# Patient Record
Sex: Female | Born: 1935 | Race: White | Hispanic: No | Smoking: Former smoker
Health system: Southern US, Community
[De-identification: ages and names within clinical notes are randomized; demographics above are authoritative.]

## PROBLEM LIST (undated history)

## (undated) DIAGNOSIS — I10 Essential (primary) hypertension: Secondary | ICD-10-CM

## (undated) DIAGNOSIS — H353290 Exudative age-related macular degeneration, unspecified eye, stage unspecified: Secondary | ICD-10-CM

## (undated) DIAGNOSIS — G43909 Migraine, unspecified, not intractable, without status migrainosus: Secondary | ICD-10-CM

## (undated) DIAGNOSIS — J45909 Unspecified asthma, uncomplicated: Secondary | ICD-10-CM

## (undated) DIAGNOSIS — K5790 Diverticulosis of intestine, part unspecified, without perforation or abscess without bleeding: Secondary | ICD-10-CM

## (undated) DIAGNOSIS — E1142 Type 2 diabetes mellitus with diabetic polyneuropathy: Secondary | ICD-10-CM

## (undated) DIAGNOSIS — R600 Localized edema: Secondary | ICD-10-CM

## (undated) DIAGNOSIS — G510 Bell's palsy: Secondary | ICD-10-CM

## (undated) DIAGNOSIS — E785 Hyperlipidemia, unspecified: Secondary | ICD-10-CM

## (undated) DIAGNOSIS — J3089 Other allergic rhinitis: Secondary | ICD-10-CM

## (undated) DIAGNOSIS — K589 Irritable bowel syndrome without diarrhea: Secondary | ICD-10-CM

## (undated) DIAGNOSIS — M810 Age-related osteoporosis without current pathological fracture: Secondary | ICD-10-CM

## (undated) DIAGNOSIS — G459 Transient cerebral ischemic attack, unspecified: Secondary | ICD-10-CM

## (undated) HISTORY — DX: Hyperlipidemia, unspecified: E78.5

## (undated) HISTORY — PX: COLONOSCOPY: SHX174

## (undated) HISTORY — DX: Exudative age-related macular degeneration, unspecified eye, stage unspecified: H35.3290

## (undated) HISTORY — PX: OOPHORECTOMY: SHX86

## (undated) HISTORY — PX: TONSILLECTOMY: SUR1361

## (undated) HISTORY — DX: Type 2 diabetes mellitus with diabetic polyneuropathy: E11.42

## (undated) HISTORY — DX: Essential (primary) hypertension: I10

## (undated) HISTORY — PX: CHOLECYSTECTOMY: SHX55

---

## 1997-08-17 ENCOUNTER — Ambulatory Visit (HOSPITAL_COMMUNITY): Admission: RE | Admit: 1997-08-17 | Discharge: 1997-08-17 | Payer: Self-pay | Admitting: Family Medicine

## 1997-09-30 ENCOUNTER — Ambulatory Visit (HOSPITAL_COMMUNITY): Admission: RE | Admit: 1997-09-30 | Discharge: 1997-09-30 | Payer: Self-pay | Admitting: Obstetrics & Gynecology

## 1997-11-02 ENCOUNTER — Ambulatory Visit (HOSPITAL_COMMUNITY): Admission: RE | Admit: 1997-11-02 | Discharge: 1997-11-02 | Payer: Self-pay | Admitting: Family Medicine

## 1998-10-27 ENCOUNTER — Ambulatory Visit (HOSPITAL_COMMUNITY): Admission: RE | Admit: 1998-10-27 | Discharge: 1998-10-27 | Payer: Self-pay | Admitting: *Deleted

## 1998-11-07 ENCOUNTER — Ambulatory Visit (HOSPITAL_COMMUNITY): Admission: RE | Admit: 1998-11-07 | Discharge: 1998-11-07 | Payer: Self-pay | Admitting: Family Medicine

## 1999-12-14 ENCOUNTER — Ambulatory Visit (HOSPITAL_COMMUNITY): Admission: RE | Admit: 1999-12-14 | Discharge: 1999-12-14 | Payer: Self-pay | Admitting: *Deleted

## 1999-12-14 ENCOUNTER — Ambulatory Visit (HOSPITAL_COMMUNITY): Admission: RE | Admit: 1999-12-14 | Discharge: 1999-12-14 | Payer: Self-pay | Admitting: Obstetrics & Gynecology

## 2001-04-03 ENCOUNTER — Ambulatory Visit (HOSPITAL_COMMUNITY): Admission: RE | Admit: 2001-04-03 | Discharge: 2001-04-03 | Payer: Self-pay | Admitting: Obstetrics & Gynecology

## 2001-04-03 ENCOUNTER — Ambulatory Visit (HOSPITAL_COMMUNITY): Admission: RE | Admit: 2001-04-03 | Discharge: 2001-04-03 | Payer: Self-pay | Admitting: *Deleted

## 2002-10-08 ENCOUNTER — Ambulatory Visit (HOSPITAL_COMMUNITY): Admission: RE | Admit: 2002-10-08 | Discharge: 2002-10-08 | Payer: Self-pay | Admitting: *Deleted

## 2002-10-08 ENCOUNTER — Ambulatory Visit (HOSPITAL_COMMUNITY): Admission: RE | Admit: 2002-10-08 | Discharge: 2002-10-08 | Payer: Self-pay | Admitting: Obstetrics & Gynecology

## 2004-11-06 ENCOUNTER — Ambulatory Visit: Payer: Self-pay | Admitting: Family Medicine

## 2004-12-11 ENCOUNTER — Ambulatory Visit: Payer: Self-pay | Admitting: Family Medicine

## 2004-12-12 ENCOUNTER — Ambulatory Visit: Payer: Self-pay | Admitting: Family Medicine

## 2005-01-02 ENCOUNTER — Ambulatory Visit: Payer: Self-pay | Admitting: Family Medicine

## 2005-02-01 ENCOUNTER — Ambulatory Visit: Payer: Self-pay | Admitting: Family Medicine

## 2005-03-04 ENCOUNTER — Ambulatory Visit: Payer: Self-pay | Admitting: Family Medicine

## 2005-03-19 ENCOUNTER — Ambulatory Visit: Payer: Self-pay | Admitting: Family Medicine

## 2005-08-05 ENCOUNTER — Ambulatory Visit: Payer: Self-pay | Admitting: Oncology

## 2005-09-01 ENCOUNTER — Ambulatory Visit: Payer: Self-pay | Admitting: Oncology

## 2005-12-11 ENCOUNTER — Ambulatory Visit: Payer: Self-pay | Admitting: Oncology

## 2006-01-02 ENCOUNTER — Ambulatory Visit: Payer: Self-pay | Admitting: Oncology

## 2006-01-21 ENCOUNTER — Ambulatory Visit: Payer: Self-pay | Admitting: Family Medicine

## 2006-06-03 ENCOUNTER — Ambulatory Visit: Payer: Self-pay | Admitting: Oncology

## 2007-02-05 ENCOUNTER — Ambulatory Visit: Payer: Self-pay | Admitting: Family Medicine

## 2008-07-06 ENCOUNTER — Ambulatory Visit: Payer: Self-pay | Admitting: Family Medicine

## 2013-11-26 DIAGNOSIS — M810 Age-related osteoporosis without current pathological fracture: Secondary | ICD-10-CM | POA: Insufficient documentation

## 2014-11-01 DIAGNOSIS — H8112 Benign paroxysmal vertigo, left ear: Secondary | ICD-10-CM | POA: Insufficient documentation

## 2015-06-13 ENCOUNTER — Ambulatory Visit (INDEPENDENT_AMBULATORY_CARE_PROVIDER_SITE_OTHER): Payer: Medicare Other | Admitting: Podiatry

## 2015-06-13 ENCOUNTER — Encounter: Payer: Self-pay | Admitting: Podiatry

## 2015-06-13 VITALS — BP 148/78 | HR 91 | Resp 18

## 2015-06-13 DIAGNOSIS — M79676 Pain in unspecified toe(s): Secondary | ICD-10-CM | POA: Diagnosis not present

## 2015-06-13 DIAGNOSIS — B351 Tinea unguium: Secondary | ICD-10-CM | POA: Diagnosis not present

## 2015-06-13 NOTE — Progress Notes (Signed)
   Subjective:    Patient ID: Carrie Pratt, female    DOB: 25-Sep-1924, 82 y.o.   MRN: [id number]   HPI  82 year old female presents the office today for concerns of thick, painful, elongated toenails that she cannot trim herself. Denies any drainage or redness or swelling around the toenails. No recent injury or trauma. No swelling to her feet. No other complaints. She states that her blood sugar has been "good".    Review of Systems  All other systems reviewed and are negative.      Objective:   Physical Exam General: AAO x3, NAD  Dermatological: Nails appear to be hypertrophic, dystrophic, brittle, discolored, elongated 10. There is no surrounding erythema or drainage. There is tenderness to nails 1-5 bilaterally. There is no surrounding erythema or drainage. No open lesions or pre-ulcerative lesions identified at this time. The right fifth digit toenail is incurvated without any tenderness on the actual ingrown toenail portion. No redness or drainage.  There is no pain with calf compression, swelling, warmth, erythema.   Neruologic: Grossly intact via light touch bilateral. Vibratory intact via tuning fork bilateral. Protective threshold with Semmes Wienstein monofilament intact to all pedal sites bilateral. Patellar and Achilles deep tendon reflexes 2+ bilateral. No Babinski or clonus noted bilateral.   Musculoskeletal: No gross boney pedal deformities bilateral. No pain, crepitus, or limitation noted with foot and ankle range of motion bilateral. Muscular strength 5/5 in all groups tested bilateral.  Gait: Unassisted, Nonantalgic.      Assessment & Plan:  82 year old female with symptomatic onychomycosis  -Treatment options discussed including all alternatives, risks, and complications -Etiology of symptoms were discussed -Nails debrided 10 without complications or bleeding.  -Discussed importance of daily foot inspection.  -Follow-up in 3 months or sooner if any problems  arise. In the meantime, encouraged to call the office with any questions, concerns, change in symptoms.   Celesta Gentile, DPM

## 2015-09-12 ENCOUNTER — Encounter: Payer: Self-pay | Admitting: Podiatry

## 2015-09-12 ENCOUNTER — Ambulatory Visit (INDEPENDENT_AMBULATORY_CARE_PROVIDER_SITE_OTHER): Payer: Medicare Other | Admitting: Podiatry

## 2015-09-12 DIAGNOSIS — B351 Tinea unguium: Secondary | ICD-10-CM

## 2015-09-12 DIAGNOSIS — M79676 Pain in unspecified toe(s): Secondary | ICD-10-CM | POA: Diagnosis not present

## 2015-09-12 NOTE — Progress Notes (Signed)
Patient ID: Carrie Pratt, female   DOB: Oct 21, 1924, 82 y.o.   MRN: [id number] Subjective: 82 y.o. returns the office today for painful, elongated, thickened toenails which she cannot trim herself. Denies any redness or drainage around the nails. Denies any acute changes since last appointment and no new complaints today. Denies any systemic complaints such as fevers, chills, nausea, vomiting.   Objective: AAO 3, NAD DP/PT pulses palpable, CRT less than 3 seconds Nails hypertrophic, dystrophic, elongated, brittle, discolored 10. There is tenderness overlying the nails 1-5 bilaterally. There is no surrounding erythema or drainage along the nail sites. No open lesions or pre-ulcerative lesions are identified. No other areas of tenderness bilateral lower extremities. No overlying edema, erythema, increased warmth. No pain with calf compression, swelling, warmth, erythema.  Assessment: Patient presents with symptomatic onychomycosis  Plan: -Treatment options including alternatives, risks, complications were discussed -Nails sharply debrided 10 without complication/bleeding. -Discussed daily foot inspection. If there are any changes, to call the office immediately.  -Follow-up in 3 months or sooner if any problems are to arise. In the meantime, encouraged to call the office with any questions, concerns, changes symptoms.  Celesta Gentile, DPM

## 2015-12-14 ENCOUNTER — Ambulatory Visit (INDEPENDENT_AMBULATORY_CARE_PROVIDER_SITE_OTHER): Payer: Medicare Other | Admitting: Podiatry

## 2015-12-14 ENCOUNTER — Encounter: Payer: Self-pay | Admitting: Podiatry

## 2015-12-14 DIAGNOSIS — M79676 Pain in unspecified toe(s): Secondary | ICD-10-CM

## 2015-12-14 DIAGNOSIS — B351 Tinea unguium: Secondary | ICD-10-CM | POA: Diagnosis not present

## 2015-12-15 NOTE — Progress Notes (Signed)
Patient ID: Carrie Pratt, female   DOB: 04-22-1924, 82 y.o.   MRN: [id number] Subjective: 82 y.o. returns the office today for painful, elongated, thickened toenails which she cannot trim herself. Denies any redness or drainage around the nails. Denies any acute changes since last appointment and no new complaints today. Denies any systemic complaints such as fevers, chills, nausea, vomiting.   Objective: AAO 3, NAD DP/PT pulses palpable, CRT less than 3 seconds Nails hypertrophic, dystrophic, elongated, brittle, discolored 10. There is tenderness overlying the nails 1-5 bilaterally. There is no surrounding erythema or drainage along the nail sites. No open lesions or pre-ulcerative lesions are identified. No other areas of tenderness bilateral lower extremities. No overlying edema, erythema, increased warmth. No pain with calf compression, swelling, warmth, erythema.  Assessment: Patient presents with symptomatic onychomycosis  Plan: -Treatment options including alternatives, risks, complications were discussed -Nails sharply debrided 10 without complication/bleeding. -Discussed daily foot inspection. If there are any changes, to call the office immediately.  -Follow-up in 3 months or sooner if any problems are to arise. In the meantime, encouraged to call the office with any questions, concerns, changes symptoms.  Celesta Gentile, DPM

## 2016-03-19 ENCOUNTER — Ambulatory Visit (INDEPENDENT_AMBULATORY_CARE_PROVIDER_SITE_OTHER): Payer: Medicare Other | Admitting: Podiatry

## 2016-03-19 DIAGNOSIS — L608 Other nail disorders: Secondary | ICD-10-CM | POA: Diagnosis not present

## 2016-03-19 DIAGNOSIS — L84 Corns and callosities: Secondary | ICD-10-CM | POA: Diagnosis not present

## 2016-03-19 DIAGNOSIS — E0843 Diabetes mellitus due to underlying condition with diabetic autonomic (poly)neuropathy: Secondary | ICD-10-CM | POA: Diagnosis not present

## 2016-03-19 DIAGNOSIS — B351 Tinea unguium: Secondary | ICD-10-CM

## 2016-03-19 DIAGNOSIS — M79672 Pain in left foot: Secondary | ICD-10-CM | POA: Diagnosis not present

## 2016-03-19 DIAGNOSIS — L603 Nail dystrophy: Secondary | ICD-10-CM

## 2016-03-19 DIAGNOSIS — M79671 Pain in right foot: Secondary | ICD-10-CM

## 2016-03-19 DIAGNOSIS — L851 Acquired keratosis [keratoderma] palmaris et plantaris: Secondary | ICD-10-CM | POA: Diagnosis not present

## 2016-03-19 DIAGNOSIS — M79609 Pain in unspecified limb: Secondary | ICD-10-CM | POA: Diagnosis not present

## 2016-03-24 NOTE — Progress Notes (Signed)
   SUBJECTIVE Patient with a history of diabetes mellitus presents to office today complaining of elongated, thickened nails. Pain while ambulating in shoes. Patient is unable to trim their own nails.   OBJECTIVE General Patient is awake, alert, and oriented x 3 and in no acute distress. Derm hyperkeratotic skin lesion noted to the fourth digit left foot Skin is dry and supple bilateral. Negative open lesions or macerations. Remaining integument unremarkable. Nails are tender, long, thickened and dystrophic with subungual debris, consistent with onychomycosis, 1-5 bilateral. No signs of infection noted. Vasc  DP and PT pedal pulses palpable bilaterally. Temperature gradient within normal limits.  Neuro Epicritic and protective threshold sensation diminished bilaterally.  Musculoskeletal Exam No symptomatic pedal deformities noted bilateral. Muscular strength within normal limits.  ASSESSMENT 1. Diabetes Mellitus w/ peripheral neuropathy 2. Onychomycosis of nail due to dermatophyte bilateral 3. Pain in foot bilateral 4. Painful callus lesion fourth digit left foot  PLAN OF CARE 1. Patient evaluated today. 2. Instructed to maintain good pedal hygiene and foot care. Stressed importance of controlling blood sugar.  3. Mechanical debridement of nails 1-5 bilaterally performed using a nail nipper. Filed with dremel without incident.  4. Excisional debridement of the callus lesion the fourth digit left foot was performed using a chisel blade without incident. 5. Return to clinic in 3 mos.     Edrick Kins, DPM [facility]  Dr. Edrick Kins, [address]                                        [place], [place] [address]                Office [phone]  Fax [phone]

## 2016-04-02 DIAGNOSIS — E119 Type 2 diabetes mellitus without complications: Secondary | ICD-10-CM | POA: Diagnosis not present

## 2016-04-23 DIAGNOSIS — E113213 Type 2 diabetes mellitus with mild nonproliferative diabetic retinopathy with macular edema, bilateral: Secondary | ICD-10-CM | POA: Diagnosis not present

## 2016-04-23 DIAGNOSIS — H2513 Age-related nuclear cataract, bilateral: Secondary | ICD-10-CM | POA: Diagnosis not present

## 2016-04-30 DIAGNOSIS — H353211 Exudative age-related macular degeneration, right eye, with active choroidal neovascularization: Secondary | ICD-10-CM | POA: Diagnosis not present

## 2016-05-02 DIAGNOSIS — E1165 Type 2 diabetes mellitus with hyperglycemia: Secondary | ICD-10-CM | POA: Diagnosis not present

## 2016-05-09 DIAGNOSIS — E1122 Type 2 diabetes mellitus with diabetic chronic kidney disease: Secondary | ICD-10-CM | POA: Diagnosis not present

## 2016-05-09 DIAGNOSIS — N182 Chronic kidney disease, stage 2 (mild): Secondary | ICD-10-CM | POA: Diagnosis not present

## 2016-05-21 DIAGNOSIS — H2513 Age-related nuclear cataract, bilateral: Secondary | ICD-10-CM | POA: Diagnosis not present

## 2016-05-22 ENCOUNTER — Encounter: Payer: Self-pay | Admitting: *Deleted

## 2016-05-22 NOTE — Pre-Procedure Instructions (Signed)
Called Dr Dingeldein's  office called regarding order for ophthalmic solution.

## 2016-05-24 DIAGNOSIS — E119 Type 2 diabetes mellitus without complications: Secondary | ICD-10-CM | POA: Diagnosis not present

## 2016-05-24 DIAGNOSIS — I10 Essential (primary) hypertension: Secondary | ICD-10-CM | POA: Diagnosis not present

## 2016-05-24 DIAGNOSIS — Z8673 Personal history of transient ischemic attack (TIA), and cerebral infarction without residual deficits: Secondary | ICD-10-CM | POA: Diagnosis not present

## 2016-05-24 DIAGNOSIS — E1165 Type 2 diabetes mellitus with hyperglycemia: Secondary | ICD-10-CM | POA: Diagnosis not present

## 2016-05-24 DIAGNOSIS — E785 Hyperlipidemia, unspecified: Secondary | ICD-10-CM | POA: Diagnosis not present

## 2016-05-28 NOTE — H&P (Signed)
See scanned note.

## 2016-05-29 ENCOUNTER — Encounter
Admission: EM | Disposition: A | Discharge status: Home / Self Care | Payer: Self-pay | Source: Ambulatory Visit | Attending: Ophthalmology

## 2016-05-29 ENCOUNTER — Encounter: Payer: Self-pay | Admitting: *Deleted

## 2016-05-29 ENCOUNTER — Ambulatory Visit
Admission: EM | Admit: 2016-05-29 | Discharge: 2016-05-29 | Disposition: A | Discharge status: Home / Self Care | Payer: Medicare Other | Source: Ambulatory Visit | Attending: Ophthalmology | Admitting: Ophthalmology

## 2016-05-29 ENCOUNTER — Ambulatory Visit: Payer: Medicare Other | Admitting: Anesthesiology

## 2016-05-29 DIAGNOSIS — I10 Essential (primary) hypertension: Secondary | ICD-10-CM | POA: Insufficient documentation

## 2016-05-29 DIAGNOSIS — E1136 Type 2 diabetes mellitus with diabetic cataract: Secondary | ICD-10-CM | POA: Insufficient documentation

## 2016-05-29 DIAGNOSIS — G459 Transient cerebral ischemic attack, unspecified: Secondary | ICD-10-CM | POA: Diagnosis not present

## 2016-05-29 DIAGNOSIS — Z79899 Other long term (current) drug therapy: Secondary | ICD-10-CM | POA: Diagnosis not present

## 2016-05-29 DIAGNOSIS — Z7984 Long term (current) use of oral hypoglycemic drugs: Secondary | ICD-10-CM | POA: Diagnosis not present

## 2016-05-29 DIAGNOSIS — J45909 Unspecified asthma, uncomplicated: Secondary | ICD-10-CM | POA: Insufficient documentation

## 2016-05-29 DIAGNOSIS — H2513 Age-related nuclear cataract, bilateral: Secondary | ICD-10-CM | POA: Diagnosis not present

## 2016-05-29 DIAGNOSIS — Z87891 Personal history of nicotine dependence: Secondary | ICD-10-CM | POA: Diagnosis not present

## 2016-05-29 DIAGNOSIS — H269 Unspecified cataract: Secondary | ICD-10-CM | POA: Diagnosis not present

## 2016-05-29 DIAGNOSIS — E119 Type 2 diabetes mellitus without complications: Secondary | ICD-10-CM | POA: Diagnosis not present

## 2016-05-29 HISTORY — DX: Age-related osteoporosis without current pathological fracture: M81.0

## 2016-05-29 HISTORY — DX: Bell's palsy: G51.0

## 2016-05-29 HISTORY — DX: Transient cerebral ischemic attack, unspecified: G45.9

## 2016-05-29 HISTORY — DX: Diverticulosis of intestine, part unspecified, without perforation or abscess without bleeding: K57.90

## 2016-05-29 HISTORY — DX: Other allergic rhinitis: J30.89

## 2016-05-29 HISTORY — DX: Migraine, unspecified, not intractable, without status migrainosus: G43.909

## 2016-05-29 HISTORY — DX: Localized edema: R60.0

## 2016-05-29 HISTORY — DX: Unspecified asthma, uncomplicated: J45.909

## 2016-05-29 HISTORY — PX: CATARACT EXTRACTION W/PHACO: SHX586

## 2016-05-29 HISTORY — DX: Irritable bowel syndrome, unspecified: K58.9

## 2016-05-29 LAB — GLUCOSE, CAPILLARY: GLUCOSE-CAPILLARY: 137 mg/dL — AB (ref 65–99)

## 2016-05-29 SURGERY — PHACOEMULSIFICATION, CATARACT, WITH IOL INSERTION
Anesthesia: Monitor Anesthesia Care | Site: Eye | Laterality: Left

## 2016-05-29 MED ORDER — LIDOCAINE HCL (PF) 4 % IJ SOLN
INTRAMUSCULAR | Status: DC | PRN
  Administered 2016-05-29: 4 mL via OPHTHALMIC

## 2016-05-29 MED ORDER — MOXIFLOXACIN HCL 0.5 % OP SOLN
OPHTHALMIC | Status: DC | PRN
  Administered 2016-05-29: 0.2 mL via OPHTHALMIC

## 2016-05-29 MED ORDER — TETRACAINE HCL 0.5 % OP SOLN
OPHTHALMIC | Status: AC
  Filled 2016-05-29: qty 2

## 2016-05-29 MED ORDER — PHENYLEPHRINE HCL 10 % OP SOLN
OPHTHALMIC | Status: AC
  Administered 2016-05-29: 1 [drp] via OPHTHALMIC
  Filled 2016-05-29: qty 5

## 2016-05-29 MED ORDER — CARBACHOL 0.01 % IO SOLN
INTRAOCULAR | Status: DC | PRN
  Administered 2016-05-29: 0.5 mL via INTRAOCULAR

## 2016-05-29 MED ORDER — FENTANYL CITRATE (PF) 100 MCG/2ML IJ SOLN
INTRAMUSCULAR | Status: AC
  Filled 2016-05-29: qty 2

## 2016-05-29 MED ORDER — BUPIVACAINE HCL (PF) 0.75 % IJ SOLN
INTRAMUSCULAR | Status: AC
  Filled 2016-05-29: qty 10

## 2016-05-29 MED ORDER — CYCLOPENTOLATE HCL 2 % OP SOLN: 1.0000 [drp] | OPHTHALMIC | Status: AC

## 2016-05-29 MED ORDER — MOXIFLOXACIN HCL 0.5 % OP SOLN: 1.0000 [drp] | OPHTHALMIC | Status: AC

## 2016-05-29 MED ORDER — MIDAZOLAM HCL 2 MG/2ML IJ SOLN
INTRAMUSCULAR | Status: AC
  Filled 2016-05-29: qty 2

## 2016-05-29 MED ORDER — HYALURONIDASE HUMAN 150 UNIT/ML IJ SOLN
INTRAMUSCULAR | Status: AC
  Filled 2016-05-29: qty 1

## 2016-05-29 MED ORDER — MIDAZOLAM HCL 2 MG/2ML IJ SOLN
INTRAMUSCULAR | Status: DC | PRN
  Administered 2016-05-29: 0.5 mg via INTRAVENOUS

## 2016-05-29 MED ORDER — EPINEPHRINE PF 1 MG/ML IJ SOLN
INTRAMUSCULAR | Status: AC
  Filled 2016-05-29: qty 1

## 2016-05-29 MED ORDER — POVIDONE-IODINE 5 % OP SOLN
OPHTHALMIC | Status: AC
  Filled 2016-05-29: qty 30

## 2016-05-29 MED ORDER — MOXIFLOXACIN HCL 0.5 % OP SOLN
1.0000 [drp] | OPHTHALMIC | Status: DC
  Administered 2016-05-29 (×3): 1 [drp] via OPHTHALMIC

## 2016-05-29 MED ORDER — EPINEPHRINE PF 1 MG/ML IJ SOLN
INTRAMUSCULAR | Status: DC | PRN
  Administered 2016-05-29: 200 mL via OPHTHALMIC

## 2016-05-29 MED ORDER — CEFUROXIME OPHTHALMIC INJECTION 1 MG/0.1 ML
INJECTION | OPHTHALMIC | Status: AC
  Filled 2016-05-29: qty 0.1

## 2016-05-29 MED ORDER — ALFENTANIL 500 MCG/ML IJ INJ
INJECTION | INTRAMUSCULAR | Status: DC | PRN
  Administered 2016-05-29: 500 ug via INTRAVENOUS

## 2016-05-29 MED ORDER — CEFUROXIME OPHTHALMIC INJECTION 1 MG/0.1 ML
INJECTION | OPHTHALMIC | Status: DC | PRN
Start: 2016-05-29 — End: 2016-05-29
  Administered 2016-05-29: 1 mg via INTRACAMERAL

## 2016-05-29 MED ORDER — POVIDONE-IODINE 5 % OP SOLN
OPHTHALMIC | Status: DC | PRN
  Administered 2016-05-29: 1 via OPHTHALMIC

## 2016-05-29 MED ORDER — CYCLOPENTOLATE HCL 2 % OP SOLN
OPHTHALMIC | Status: AC
  Administered 2016-05-29: 1 [drp] via OPHTHALMIC
  Filled 2016-05-29: qty 2

## 2016-05-29 MED ORDER — PHENYLEPHRINE HCL 10 % OP SOLN
1.0000 [drp] | OPHTHALMIC | Status: AC
  Administered 2016-05-29 (×4): 1 [drp] via OPHTHALMIC

## 2016-05-29 MED ORDER — TETRACAINE HCL 0.5 % OP SOLN
OPHTHALMIC | Status: DC | PRN
  Administered 2016-05-29: 2 [drp] via OPHTHALMIC

## 2016-05-29 MED ORDER — MOXIFLOXACIN HCL 0.5 % OP SOLN
OPHTHALMIC | Status: AC
  Administered 2016-05-29: 1 [drp] via OPHTHALMIC
  Filled 2016-05-29: qty 3

## 2016-05-29 MED ORDER — PHENYLEPHRINE HCL 10 % OP SOLN: 1.0000 [drp] | OPHTHALMIC | Status: AC

## 2016-05-29 MED ORDER — CYCLOPENTOLATE HCL 2 % OP SOLN
1.0000 [drp] | OPHTHALMIC | Status: DC
  Administered 2016-05-29 (×4): 1 [drp] via OPHTHALMIC

## 2016-05-29 MED ORDER — SODIUM CHLORIDE 0.9 % IV SOLN
INTRAVENOUS | Status: DC
  Administered 2016-05-29 (×2): via INTRAVENOUS

## 2016-05-29 MED ORDER — NA CHONDROIT SULF-NA HYALURON 40-17 MG/ML IO SOLN
INTRAOCULAR | Status: DC | PRN
Start: 2016-05-29 — End: 2016-05-29
  Administered 2016-05-29: 1 mL via INTRAOCULAR

## 2016-05-29 MED ORDER — NA CHONDROIT SULF-NA HYALURON 40-17 MG/ML IO SOLN
INTRAOCULAR | Status: AC
  Filled 2016-05-29: qty 1

## 2016-05-29 SURGICAL SUPPLY — 30 items
CANNULA ANT/CHMB 27GA (MISCELLANEOUS) ×3 IMPLANT
CORD BIP STRL DISP 12FT (MISCELLANEOUS) ×3 IMPLANT
CUP MEDICINE 2OZ PLAST GRAD ST (MISCELLANEOUS) ×3 IMPLANT
DRAPE XRAY CASSETTE 23X24 (DRAPES) ×3 IMPLANT
ERASER HMR WETFIELD 18G (MISCELLANEOUS) ×3 IMPLANT
GLOVE BIO SURGEON STRL SZ8 (GLOVE) ×3 IMPLANT
GLOVE SURG LX 6.5 MICRO (GLOVE) ×2
GLOVE SURG LX 8.0 MICRO (GLOVE) ×2
GLOVE SURG LX STRL 6.5 MICRO (GLOVE) ×1 IMPLANT
GLOVE SURG LX STRL 8.0 MICRO (GLOVE) ×1 IMPLANT
GOWN STRL REUS W/ TWL LRG LVL3 (GOWN DISPOSABLE) ×1 IMPLANT
GOWN STRL REUS W/ TWL XL LVL3 (GOWN DISPOSABLE) ×1 IMPLANT
GOWN STRL REUS W/TWL LRG LVL3 (GOWN DISPOSABLE) ×2
GOWN STRL REUS W/TWL XL LVL3 (GOWN DISPOSABLE) ×2
LENS IOL ACRSF IQ ULTRA 26.5 (Intraocular Lens) ×1 IMPLANT
LENS IOL ACRYSOF IQ 26.5 (Intraocular Lens) ×3 IMPLANT
PACK CATARACT (MISCELLANEOUS) ×3 IMPLANT
PACK CATARACT DINGLEDEIN LX (MISCELLANEOUS) ×3 IMPLANT
PACK EYE AFTER SURG (MISCELLANEOUS) ×3 IMPLANT
SHLD EYE VISITEC  UNIV (MISCELLANEOUS) ×3 IMPLANT
SOL BSS BAG (MISCELLANEOUS) ×3
SOL PREP PVP 2OZ (MISCELLANEOUS) ×3
SOLUTION BSS BAG (MISCELLANEOUS) ×1 IMPLANT
SOLUTION PREP PVP 2OZ (MISCELLANEOUS) ×1 IMPLANT
SUT SILK 5-0 (SUTURE) ×3 IMPLANT
SYR 3ML LL SCALE MARK (SYRINGE) ×3 IMPLANT
SYR 5ML LL (SYRINGE) ×3 IMPLANT
SYR TB 1ML 27GX1/2 LL (SYRINGE) ×3 IMPLANT
WATER STERILE IRR 250ML POUR (IV SOLUTION) ×3 IMPLANT
WIPE NON LINTING 3.25X3.25 (MISCELLANEOUS) ×3 IMPLANT

## 2016-05-29 NOTE — Anesthesia Preprocedure Evaluation (Signed)
Anesthesia Evaluation  Patient identified by MRN, date of birth, ID band Patient awake    Reviewed: Allergy & Precautions, NPO status , Patient's Chart, lab work & pertinent test results  History of Anesthesia Complications Negative for: history of anesthetic complications  Airway Mallampati: III       Dental   Pulmonary asthma , former smoker,           Cardiovascular hypertension, Pt. on medications      Neuro/Psych TIA   GI/Hepatic negative GI ROS, Neg liver ROS,   Endo/Other  diabetes, Type 2, Oral Hypoglycemic Agents  Renal/GU negative Renal ROS     Musculoskeletal   Abdominal   Peds  Hematology negative hematology ROS (+)   Anesthesia Other Findings   Reproductive/Obstetrics                             Anesthesia Physical Anesthesia Plan  ASA: III  Anesthesia Plan: MAC   Post-op Pain Management:    Induction:   Airway Management Planned:   Additional Equipment:   Intra-op Plan:   Post-operative Plan:   Informed Consent: I have reviewed the patients History and Physical, chart, labs and discussed the procedure including the risks, benefits and alternatives for the proposed anesthesia with the patient or authorized representative who has indicated his/her understanding and acceptance.     Plan Discussed with:   Anesthesia Plan Comments:         Anesthesia Quick Evaluation

## 2016-05-29 NOTE — Transfer of Care (Signed)
Immediate Anesthesia Transfer of Care Note  Patient: Carrie Pratt  Procedure(s) Performed: Procedure(s) with comments: CATARACT EXTRACTION PHACO AND INTRAOCULAR LENS PLACEMENT (IOC) (Left) - [place]: 01:48.8 AP% 25.2 CDE: 50.41 LHT#[id number]  Patient Location: PACU  Anesthesia Type:MAC  Level of Consciousness: awake  Airway & Oxygen Therapy: Patient Spontanous Breathing  Post-op Assessment: Report given to RN and Post -op Vital signs reviewed and stable  Post vital signs: Reviewed and stable  Last Vitals:  Vitals:   05/22/16 1443 05/29/16 0802  BP: 121/73 140/62  Pulse: 95 83  Resp:  20  Temp:  36.6 C    Last Pain:  Vitals:   05/29/16 0802  TempSrc: Oral         Complications: No apparent anesthesia complications

## 2016-05-29 NOTE — Discharge Instructions (Signed)
Eye Surgery Discharge Instructions  Expect mild scratchy sensation or mild soreness. DO NOT RUB YOUR EYE!  The day of surgery:  Minimal physical activity, but bed rest is not required  No reading, computer work, or close hand work  No bending, lifting, or straining.  May watch TV  For 24 hours:  No driving, legal decisions, or alcoholic beverages  Safety precautions  Eat anything you prefer: It is better to start with liquids, then soup then solid foods.  _____ Eye patch should be worn until postoperative exam tomorrow.  ____ Solar shield eyeglasses should be worn for comfort in the sunlight/patch while sleeping  Resume all regular medications including aspirin or Coumadin if these were discontinued prior to surgery. You may shower, bathe, shave, or wash your hair. Tylenol may be taken for mild discomfort.  Call your doctor if you experience significant pain, nausea, or vomiting, fever > 101 or other signs of infection. [phone] or [phone] Specific instructions:  Follow-up Information    Wen Merced, MD Follow up.   Specialty:  Ophthalmology Why:  March 29 at 10:30am Contact information: [address]   [place] [address] [phone]

## 2016-05-29 NOTE — Op Note (Signed)
Date of Surgery: 05/29/2016 Date of Dictation: 05/29/2016 10:07 AM Pre-operative Diagnosis:  Nuclear Sclerotic Cataract left Eye Post-operative Diagnosis: same Procedure performed: Extra-capsular Cataract Extraction (ECCE) with placement of a posterior chamber intraocular lens (IOL) left Eye IOL:  Implant Name Type Inv. Item Serial No. Manufacturer Lot No. LRB No. Used  LENS IOL ACRYSOF IQ 26.5 - [id number] Intraocular Lens LENS IOL ACRYSOF IQ 26.5 [id number] ALCON   Left 1   Anesthesia: 2% Lidocaine and 4% Marcaine in a 50/50 mixture with 10 unites/ml of Hylenex given as a peribulbar Anesthesiologist: No anesthesia staff entered. Complications: none Estimated Blood Loss: less than 1 ml  Description of procedure:  The patient was given anesthesia and sedation via intravenous access. The patient was then prepped and draped in the usual fashion. A 25-gauge needle was bent for initiating the capsulorhexis. A 5-0 silk suture was placed through the conjunctiva superior and inferiorly to serve as bridle sutures. Hemostasis was obtained at the superior limbus using an eraser cautery. A partial thickness groove was made at the anterior surgical limbus with a 64 Beaver blade and this was dissected anteriorly with an Avaya. The anterior chamber was entered at 10 o'clock with a 1.0 mm paracentesis knife and through the lamellar dissection with a 2.6 mm Alcon keratome. Epi-Shugarcaine 0.5 CC [9 cc BSS Plus (Alcon), 3 cc 4% preservative-free lidocaine (Hospira) and 4 cc 1:1000 preservative-free, bisulfite-free epinephrine] was injected into the anterior chamber via the paracentesis tract. Epi-Shugarcaine 0.5 CC [9 cc BSS Plus (Alcon), 3 cc 4% preservative-free lidocaine (Hospira) and 4 cc 1:1000 preservative-free, bisulfite-free epinephrine] was injected into the anterior chamber via the paracentesis tract. DiscoVisc was injected to replace the aqueous and a continuous tear curvilinear  capsulorhexis was performed using a bent 25-gauge needle.  Balance salt on a syringe was used to perform hydro-dissection and phacoemulsification was carried out using a divide and conquer technique. Procedure(s) with comments: CATARACT EXTRACTION PHACO AND INTRAOCULAR LENS PLACEMENT (IOC) (Left) - [place]: 01:48.8 AP% 25.2 CDE: 50.41 BUL#[id number]. Irrigation/aspiration was used to remove the residual cortex and the capsular bag was inflated with DiscoVisc. The intraocular lens was inserted into the capsular bag using a pre-loaded UltraSert Delivery System. Irrigation/aspiration was used to remove the residual DiscoVisc. The wound was inflated with balanced salt and checked for leaks. None were found. Miostat was injected via the paracentesis track and 0.1 ml of cefuroxime containing 1 mg of drug  was injected via the paracentesis track. The wound was checked for leaks again and none were found.   The bridal sutures were removed and two drops of Vigamox were placed on the eye. An eye shield was placed to protect the eye and the patient was discharged to the recovery area in good condition.   Franki Alcaide MD

## 2016-05-29 NOTE — Anesthesia Post-op Follow-up Note (Cosign Needed)
Anesthesia QCDR form completed.        

## 2016-05-29 NOTE — Anesthesia Procedure Notes (Signed)
Procedure Name: MAC Date/Time: 05/29/2016 9:25 AM Performed by: Allean Found Pre-anesthesia Checklist: Patient identified, Emergency Drugs available, Suction available, Patient being monitored and Timeout performed Patient Re-evaluated:Patient Re-evaluated prior to inductionOxygen Delivery Method: Nasal cannula Intubation Type: IV induction

## 2016-05-30 ENCOUNTER — Encounter: Payer: Self-pay | Admitting: Ophthalmology

## 2016-05-30 NOTE — Anesthesia Postprocedure Evaluation (Signed)
Anesthesia Post Note  Patient: Carrie Pratt  Procedure(s) Performed: Procedure(s) (LRB): CATARACT EXTRACTION PHACO AND INTRAOCULAR LENS PLACEMENT (IOC) (Left)  Patient location during evaluation: Other Anesthesia Type: MAC Level of consciousness: awake and alert Pain management: pain level controlled Vital Signs Assessment: post-procedure vital signs reviewed and stable Respiratory status: spontaneous breathing and respiratory function stable Cardiovascular status: blood pressure returned to baseline and stable Anesthetic complications: no     Last Vitals:  Vitals:   05/29/16 1011 05/29/16 1021  BP: 135/63 116/65  Pulse:    Resp: 15   Temp: 36.6 C     Last Pain:  Vitals:   05/29/16 0802  TempSrc: Oral                 KEPHART,WILLIAM K

## 2016-06-03 ENCOUNTER — Encounter: Payer: Self-pay | Admitting: Ophthalmology

## 2016-06-04 NOTE — Addendum Note (Signed)
Addendum  created 06/04/16 1141 by Allean Found, CRNA   Anesthesia Attestations deleted, Charge Capture section accepted

## 2016-06-24 ENCOUNTER — Ambulatory Visit: Payer: Medicare Other | Admitting: Podiatry

## 2016-07-16 DIAGNOSIS — H353211 Exudative age-related macular degeneration, right eye, with active choroidal neovascularization: Secondary | ICD-10-CM | POA: Diagnosis not present

## 2016-08-01 ENCOUNTER — Encounter: Payer: Self-pay | Admitting: Podiatry

## 2016-08-01 ENCOUNTER — Ambulatory Visit (INDEPENDENT_AMBULATORY_CARE_PROVIDER_SITE_OTHER): Payer: Medicare Other | Admitting: Podiatry

## 2016-08-01 DIAGNOSIS — B351 Tinea unguium: Secondary | ICD-10-CM | POA: Diagnosis not present

## 2016-08-01 DIAGNOSIS — M79609 Pain in unspecified limb: Secondary | ICD-10-CM

## 2016-08-01 NOTE — Progress Notes (Signed)
Complaint:  Visit Type: Patient returns to my office for continued preventative foot care services. Complaint: Patient states" my nails have grown long and thick and become painful to walk and wear shoes" Patient has been diagnosed with DM with no foot complications. The patient presents for preventative foot care services. No changes to ROS  Podiatric Exam: Vascular: dorsalis pedis and posterior tibial pulses are palpable bilateral. Capillary return is immediate. Temperature gradient is WNL. Skin turgor WNL  Sensorium: Diminished  Semmes Weinstein monofilament test. Normal tactile sensation bilaterally. Nail Exam: Pt has thick disfigured discolored nails with subungual debris noted bilateral entire nail hallux through fifth toenails Ulcer Exam: There is no evidence of ulcer or pre-ulcerative changes or infection. Orthopedic Exam: Muscle tone and strength are WNL. No limitations in general ROM. No crepitus or effusions noted. Foot type and digits show no abnormalities. Bony prominences are unremarkable. Skin: No Porokeratosis. No infection or ulcers  Diagnosis:  Onychomycosis, , Pain in right toe, pain in left toes  Treatment & Plan Procedures and Treatment: Consent by patient was obtained for treatment procedures. The patient understood the discussion of treatment and procedures well. All questions were answered thoroughly reviewed. Debridement of mycotic and hypertrophic toenails, 1 through 5 bilateral and clearing of subungual debris. No ulceration, no infection noted.  Return Visit-Office Procedure: Patient instructed to return to the office for a follow up visit 3 months for continued evaluation and treatment.    Reeves Musick DPM 

## 2016-08-08 DIAGNOSIS — E1122 Type 2 diabetes mellitus with diabetic chronic kidney disease: Secondary | ICD-10-CM | POA: Diagnosis not present

## 2016-08-08 DIAGNOSIS — N182 Chronic kidney disease, stage 2 (mild): Secondary | ICD-10-CM | POA: Diagnosis not present

## 2016-08-14 DIAGNOSIS — N182 Chronic kidney disease, stage 2 (mild): Secondary | ICD-10-CM | POA: Diagnosis not present

## 2016-08-14 DIAGNOSIS — E1122 Type 2 diabetes mellitus with diabetic chronic kidney disease: Secondary | ICD-10-CM | POA: Diagnosis not present

## 2016-08-20 DIAGNOSIS — H353211 Exudative age-related macular degeneration, right eye, with active choroidal neovascularization: Secondary | ICD-10-CM | POA: Diagnosis not present

## 2016-10-01 DIAGNOSIS — H353211 Exudative age-related macular degeneration, right eye, with active choroidal neovascularization: Secondary | ICD-10-CM | POA: Diagnosis not present

## 2016-10-17 DIAGNOSIS — Z961 Presence of intraocular lens: Secondary | ICD-10-CM | POA: Diagnosis not present

## 2016-10-28 ENCOUNTER — Ambulatory Visit: Payer: Medicare Other | Admitting: Podiatry

## 2016-11-12 DIAGNOSIS — H353211 Exudative age-related macular degeneration, right eye, with active choroidal neovascularization: Secondary | ICD-10-CM | POA: Diagnosis not present

## 2016-12-12 DIAGNOSIS — E119 Type 2 diabetes mellitus without complications: Secondary | ICD-10-CM | POA: Diagnosis not present

## 2016-12-12 DIAGNOSIS — I10 Essential (primary) hypertension: Secondary | ICD-10-CM | POA: Diagnosis not present

## 2016-12-13 DIAGNOSIS — I10 Essential (primary) hypertension: Secondary | ICD-10-CM | POA: Diagnosis not present

## 2016-12-13 DIAGNOSIS — E785 Hyperlipidemia, unspecified: Secondary | ICD-10-CM | POA: Diagnosis not present

## 2016-12-13 DIAGNOSIS — Z8673 Personal history of transient ischemic attack (TIA), and cerebral infarction without residual deficits: Secondary | ICD-10-CM | POA: Diagnosis not present

## 2016-12-13 DIAGNOSIS — E119 Type 2 diabetes mellitus without complications: Secondary | ICD-10-CM | POA: Diagnosis not present

## 2016-12-13 DIAGNOSIS — M810 Age-related osteoporosis without current pathological fracture: Secondary | ICD-10-CM | POA: Diagnosis not present

## 2016-12-13 DIAGNOSIS — Z0000 Encounter for general adult medical examination without abnormal findings: Secondary | ICD-10-CM | POA: Diagnosis not present

## 2016-12-26 DIAGNOSIS — Z23 Encounter for immunization: Secondary | ICD-10-CM | POA: Diagnosis not present

## 2017-01-07 DIAGNOSIS — H353211 Exudative age-related macular degeneration, right eye, with active choroidal neovascularization: Secondary | ICD-10-CM | POA: Diagnosis not present

## 2017-02-14 DIAGNOSIS — R1032 Left lower quadrant pain: Secondary | ICD-10-CM | POA: Diagnosis not present

## 2017-02-14 DIAGNOSIS — R1031 Right lower quadrant pain: Secondary | ICD-10-CM | POA: Diagnosis not present

## 2017-02-14 DIAGNOSIS — Z8719 Personal history of other diseases of the digestive system: Secondary | ICD-10-CM | POA: Diagnosis not present

## 2017-02-18 DIAGNOSIS — R05 Cough: Secondary | ICD-10-CM | POA: Diagnosis not present

## 2017-02-18 DIAGNOSIS — J069 Acute upper respiratory infection, unspecified: Secondary | ICD-10-CM | POA: Diagnosis not present

## 2017-02-18 DIAGNOSIS — J209 Acute bronchitis, unspecified: Secondary | ICD-10-CM | POA: Diagnosis not present

## 2017-02-18 DIAGNOSIS — J3089 Other allergic rhinitis: Secondary | ICD-10-CM | POA: Diagnosis not present

## 2017-03-18 DIAGNOSIS — E119 Type 2 diabetes mellitus without complications: Secondary | ICD-10-CM | POA: Diagnosis not present

## 2017-03-18 DIAGNOSIS — H353211 Exudative age-related macular degeneration, right eye, with active choroidal neovascularization: Secondary | ICD-10-CM | POA: Diagnosis not present

## 2017-03-18 DIAGNOSIS — E113293 Type 2 diabetes mellitus with mild nonproliferative diabetic retinopathy without macular edema, bilateral: Secondary | ICD-10-CM | POA: Diagnosis not present

## 2017-03-18 DIAGNOSIS — M79674 Pain in right toe(s): Secondary | ICD-10-CM | POA: Diagnosis not present

## 2017-03-18 DIAGNOSIS — B351 Tinea unguium: Secondary | ICD-10-CM | POA: Diagnosis not present

## 2017-03-18 DIAGNOSIS — M79675 Pain in left toe(s): Secondary | ICD-10-CM | POA: Diagnosis not present

## 2017-03-19 DIAGNOSIS — R300 Dysuria: Secondary | ICD-10-CM | POA: Diagnosis not present

## 2017-03-19 DIAGNOSIS — A499 Bacterial infection, unspecified: Secondary | ICD-10-CM | POA: Diagnosis not present

## 2017-03-19 DIAGNOSIS — N390 Urinary tract infection, site not specified: Secondary | ICD-10-CM | POA: Diagnosis not present

## 2017-04-21 DIAGNOSIS — H2511 Age-related nuclear cataract, right eye: Secondary | ICD-10-CM | POA: Diagnosis not present

## 2017-06-05 DIAGNOSIS — I10 Essential (primary) hypertension: Secondary | ICD-10-CM | POA: Diagnosis not present

## 2017-06-05 DIAGNOSIS — E119 Type 2 diabetes mellitus without complications: Secondary | ICD-10-CM | POA: Diagnosis not present

## 2017-06-05 DIAGNOSIS — E785 Hyperlipidemia, unspecified: Secondary | ICD-10-CM | POA: Diagnosis not present

## 2017-06-09 DIAGNOSIS — E785 Hyperlipidemia, unspecified: Secondary | ICD-10-CM | POA: Diagnosis not present

## 2017-06-09 DIAGNOSIS — E119 Type 2 diabetes mellitus without complications: Secondary | ICD-10-CM | POA: Diagnosis not present

## 2017-06-09 DIAGNOSIS — I10 Essential (primary) hypertension: Secondary | ICD-10-CM | POA: Diagnosis not present

## 2017-06-12 DIAGNOSIS — E785 Hyperlipidemia, unspecified: Secondary | ICD-10-CM | POA: Diagnosis not present

## 2017-06-12 DIAGNOSIS — E119 Type 2 diabetes mellitus without complications: Secondary | ICD-10-CM | POA: Diagnosis not present

## 2017-06-12 DIAGNOSIS — I10 Essential (primary) hypertension: Secondary | ICD-10-CM | POA: Diagnosis not present

## 2017-06-17 ENCOUNTER — Other Ambulatory Visit: Payer: Self-pay

## 2017-06-17 ENCOUNTER — Emergency Department
Admission: EM | Admit: 2017-06-17 | Discharge: 2017-06-18 | Disposition: A | Discharge status: Skilled Nursing Facility | Payer: Medicare Other | Attending: Emergency Medicine | Admitting: Emergency Medicine

## 2017-06-17 ENCOUNTER — Emergency Department: Payer: Medicare Other

## 2017-06-17 DIAGNOSIS — J45909 Unspecified asthma, uncomplicated: Secondary | ICD-10-CM | POA: Insufficient documentation

## 2017-06-17 DIAGNOSIS — Z7902 Long term (current) use of antithrombotics/antiplatelets: Secondary | ICD-10-CM | POA: Insufficient documentation

## 2017-06-17 DIAGNOSIS — Z87891 Personal history of nicotine dependence: Secondary | ICD-10-CM | POA: Insufficient documentation

## 2017-06-17 DIAGNOSIS — R0989 Other specified symptoms and signs involving the circulatory and respiratory systems: Secondary | ICD-10-CM | POA: Diagnosis present

## 2017-06-17 DIAGNOSIS — Z7982 Long term (current) use of aspirin: Secondary | ICD-10-CM | POA: Insufficient documentation

## 2017-06-17 DIAGNOSIS — R0602 Shortness of breath: Secondary | ICD-10-CM | POA: Diagnosis not present

## 2017-06-17 DIAGNOSIS — E119 Type 2 diabetes mellitus without complications: Secondary | ICD-10-CM | POA: Insufficient documentation

## 2017-06-17 DIAGNOSIS — H353211 Exudative age-related macular degeneration, right eye, with active choroidal neovascularization: Secondary | ICD-10-CM | POA: Diagnosis not present

## 2017-06-17 DIAGNOSIS — B351 Tinea unguium: Secondary | ICD-10-CM | POA: Diagnosis not present

## 2017-06-17 DIAGNOSIS — I10 Essential (primary) hypertension: Secondary | ICD-10-CM | POA: Insufficient documentation

## 2017-06-17 DIAGNOSIS — Z7984 Long term (current) use of oral hypoglycemic drugs: Secondary | ICD-10-CM | POA: Insufficient documentation

## 2017-06-17 DIAGNOSIS — R0981 Nasal congestion: Secondary | ICD-10-CM | POA: Diagnosis not present

## 2017-06-17 DIAGNOSIS — J988 Other specified respiratory disorders: Secondary | ICD-10-CM | POA: Diagnosis not present

## 2017-06-17 DIAGNOSIS — R079 Chest pain, unspecified: Secondary | ICD-10-CM | POA: Diagnosis not present

## 2017-06-17 LAB — CBC
HCT: 28.4 % — ABNORMAL LOW (ref 35.0–47.0)
HEMOGLOBIN: 9.3 g/dL — AB (ref 12.0–16.0)
MCH: 28.2 pg (ref 26.0–34.0)
MCHC: 32.7 g/dL (ref 32.0–36.0)
MCV: 86.2 fL (ref 80.0–100.0)
Platelets: 260 10*3/uL (ref 150–440)
RBC: 3.3 MIL/uL — ABNORMAL LOW (ref 3.80–5.20)
RDW: 15.8 % — ABNORMAL HIGH (ref 11.5–14.5)
WBC: 7.2 10*3/uL (ref 3.6–11.0)

## 2017-06-17 LAB — BASIC METABOLIC PANEL
ANION GAP: 8 (ref 5–15)
BUN: 31 mg/dL — ABNORMAL HIGH (ref 6–20)
CALCIUM: 8.9 mg/dL (ref 8.9–10.3)
CO2: 26 mmol/L (ref 22–32)
Chloride: 100 mmol/L — ABNORMAL LOW (ref 101–111)
Creatinine, Ser: 1.42 mg/dL — ABNORMAL HIGH (ref 0.44–1.00)
GFR, EST AFRICAN AMERICAN: 36 mL/min — AB (ref >60)
GFR, EST NON AFRICAN AMERICAN: 31 mL/min — AB (ref >60)
Glucose, Bld: 182 mg/dL — ABNORMAL HIGH (ref 65–99)
POTASSIUM: 3.6 mmol/L (ref 3.5–5.1)
SODIUM: 134 mmol/L — AB (ref 135–145)

## 2017-06-17 LAB — TROPONIN I: Troponin I: <0.03 ng/mL (ref <0.03)

## 2017-06-17 MED ORDER — IPRATROPIUM-ALBUTEROL 0.5-2.5 (3) MG/3ML IN SOLN
3.0000 mL | Freq: Once | RESPIRATORY_TRACT | Status: AC
  Administered 2017-06-17: 3 mL via RESPIRATORY_TRACT
  Filled 2017-06-17: qty 3

## 2017-06-17 NOTE — ED Triage Notes (Signed)
Pt arrives [facility] from home for chest pressure that began at 7pm. Pt had an injection in R eye around 1pm today for macular degeneration. Pt had 2 nitro and 324 asa en route. No cardiac hx. Hx Dm and HTN. CBG 198. VSS with EMS

## 2017-06-17 NOTE — ED Notes (Signed)
Pt is from [place]. 

## 2017-06-17 NOTE — ED Notes (Signed)
Pt sitting up sipping on water.

## 2017-06-17 NOTE — ED Notes (Signed)
Patient transported to X-ray 

## 2017-06-17 NOTE — ED Notes (Signed)
Dr. Goodman at bedside.  

## 2017-06-17 NOTE — ED Provider Notes (Signed)
[facility] Emergency Department Provider Note   ____________________________________________   I have reviewed the triage vital signs and the nursing notes.   HISTORY  Chief Complaint Chest congestion  History limited by: Not Limited   HPI Carrie Pratt is a 84 y.o. female who presents to the emergency department today with primary concern for chest congestion.  She states that she feels like she is having a hard time getting a full breath.  She states she feels like she is having a hard time getting the congestion out of her chest.  She points to the central chest as being the primary location.  She has had some cough.  Has a history of asthma and tried her inhalers at home without any significant relief.  Patient denies any true chest pain.  No recent fevers.   Per medical record review patient has a history of Asthma, has singulair prescription  Past Medical History:  Diagnosis Date  . Allergy   . Asthma    as a child  . Bell's palsy   . Bronchitis   . Diabetes mellitus without complication ([place])   . Diverticulosis   . Environmental and seasonal allergies   . Hyperlipidemia   . Hypertension   . IBS (irritable bowel syndrome)   . Lower extremity edema   . Migraines   . Osteoporosis   . TIA (transient ischemic attack)     There are no active problems to display for this patient.   Past Surgical History:  Procedure Laterality Date  . CATARACT EXTRACTION W/PHACO Left 05/29/2016   Procedure: CATARACT EXTRACTION PHACO AND INTRAOCULAR LENS PLACEMENT (IOC);  Surgeon: Estill Cotta, MD;  Location: [facility] ORS;  Service: Ophthalmology;  Laterality: Left;  [place]: 01:48.8 AP% 25.2 CDE: 50.41 QVZ#[id number] H  . CHOLECYSTECTOMY    . COLONOSCOPY    . OOPHORECTOMY    . TONSILLECTOMY      Prior to Admission medications   Medication Sig Start Date End Date Taking? Authorizing Provider  aspirin EC 81 MG tablet Take 81 mg by mouth daily.     [provider]  cetirizine (ZYRTEC) 10 MG tablet Take 10 mg by mouth daily.    [provider]  clopidogrel (PLAVIX) 75 MG tablet Take 75 mg by mouth daily.  03/14/15   [provider]  glimepiride (AMARYL) 4 MG tablet Take 4 mg by mouth daily with breakfast.    [provider]  lisinopril-hydrochlorothiazide (PRINZIDE,ZESTORETIC) 20-12.5 MG tablet Take 1 tablet by mouth daily.  05/30/15   [provider]  metFORMIN (GLUCOPHAGE-XR) 500 MG 24 hr tablet Take 1,000 mg by mouth daily with supper.    [provider]  montelukast (SINGULAIR) 10 MG tablet Take 10 mg by mouth at bedtime as needed.  03/15/15   [provider]  rosuvastatin (CRESTOR) 20 MG tablet Take 20 mg by mouth daily.  01/04/15 05/24/16  [provider]    Allergies Erythromycin  History reviewed. No pertinent family history.  Social History Social History   Tobacco Use  . Smoking status: Former Research scientist (life sciences)  . Smokeless tobacco: Never Used  Substance Use Topics  . Alcohol use: Yes    Alcohol/week: 0.0 oz  . Drug use: No    Review of Systems Constitutional: No fever/chills Eyes: No visual changes. ENT: No sore throat. Cardiovascular: Positive for chest congestion Respiratory: Denies shortness of breath. Gastrointestinal: No abdominal pain.  No nausea, no vomiting.  No diarrhea.   Genitourinary: Negative for dysuria.  Musculoskeletal: Negative for back pain. Skin: Negative for rash. Neurological: Negative for headaches, focal weakness or numbness.  ____________________________________________   PHYSICAL EXAM:  VITAL SIGNS: ED Triage Vitals [06/17/17 2052]  Enc Vitals Group     BP (!) 109/54     Pulse Rate 83     Resp 16     Temp 98.1 F (36.7 C)     Temp Source Oral     SpO2 99 %     Weight 128 lb (58.1 kg)     Height 5\' 3"  (1.6 m)     Head Circumference      Peak Flow      Pain Score 7   Constitutional: Alert and oriented. Well appearing and in  no distress. Eyes: Conjunctivae are normal.  ENT   Head: Normocephalic and atraumatic.   Nose: No congestion/rhinnorhea.   Mouth/Throat: Mucous membranes are moist.   Neck: No stridor. Hematological/Lymphatic/Immunilogical: No cervical lymphadenopathy. Cardiovascular: Normal rate, regular rhythm.  No murmurs, rubs, or gallops.  Respiratory: Normal respiratory effort without tachypnea nor retractions. Breath sounds are clear and equal bilaterally. No wheezes/rales/rhonchi. Gastrointestinal: Soft and non tender. No rebound. No guarding.  Genitourinary: Deferred Musculoskeletal: Normal range of motion in all extremities. No lower extremity edema. Neurologic:  Normal speech and language. No gross focal neurologic deficits are appreciated.  Skin:  Skin is warm, dry and intact. No rash noted. Psychiatric: Mood and affect are normal. Speech and behavior are normal. Patient exhibits appropriate insight and judgment.  ____________________________________________    LABS (pertinent positives/negatives)  Trop <0.03 CBC wbc 7.2, hgb 9.3, plt 260 BMP na 134, k 3.6, cr 1.42  ____________________________________________   EKG  I, Nance Pear, attending physician, personally viewed and interpreted this EKG  EKG Time: 2012 Rate: 95 Rhythm: sinus rhythm Axis: normal Intervals: qtc 466 QRS: narrow, q waves III, v1 ST changes: no st elevation Impression: abnormal ekg  ____________________________________________    RADIOLOGY  CXR No infiltrate   ____________________________________________   PROCEDURES  Procedures  ____________________________________________   INITIAL IMPRESSION / ASSESSMENT AND PLAN / ED COURSE  Pertinent labs & imaging results that were available during my care of the patient were reviewed by me and considered in my medical decision making (see chart for details).  Patient presented to the emergency department today because of concerns  for chest congestion.  Differential for chest congestion would be large.  Concern for pneumonia high on the list.  Chest x-ray however did not show pneumonia or pneumothorax.  Patient no respiratory distress on exam.  Patient was given DuoNeb treatment and did feel better.  Did get the patient up and ambulate and she felt comfortable going home.  Discussed with patient importance of continuing inhaler use.  ____________________________________________   FINAL CLINICAL IMPRESSION(S) / ED DIAGNOSES  Final diagnoses:  Congestion of upper airway     Note: This dictation was prepared with Dragon dictation. Any transcriptional errors that result from this process are unintentional     Nance Pear, MD 06/18/17 1524

## 2017-06-18 DIAGNOSIS — Z8673 Personal history of transient ischemic attack (TIA), and cerebral infarction without residual deficits: Secondary | ICD-10-CM | POA: Diagnosis not present

## 2017-06-18 DIAGNOSIS — E119 Type 2 diabetes mellitus without complications: Secondary | ICD-10-CM | POA: Diagnosis not present

## 2017-06-18 DIAGNOSIS — E785 Hyperlipidemia, unspecified: Secondary | ICD-10-CM | POA: Diagnosis not present

## 2017-06-18 DIAGNOSIS — I10 Essential (primary) hypertension: Secondary | ICD-10-CM | POA: Diagnosis not present

## 2017-06-18 NOTE — ED Notes (Signed)
Pt ambulated to the bedside commode with the help of Curly Rim and returned to her bed without difficulty.

## 2017-06-18 NOTE — Discharge Instructions (Addendum)
Please seek medical attention for any high fevers, chest pain, shortness of breath, change in behavior, persistent vomiting, bloody stool or any other new or concerning symptoms.  

## 2017-06-18 NOTE — ED Notes (Signed)
Pt left the ED via EMS since she was not able to find someone to tale her back to [facility] assisted living. Pt was able to understand the discharge instructions.

## 2017-06-18 NOTE — ED Notes (Signed)
Mallie Mussel RN tried to call the Pt's children without success.

## 2017-06-24 DIAGNOSIS — S0501XS Injury of conjunctiva and corneal abrasion without foreign body, right eye, sequela: Secondary | ICD-10-CM | POA: Diagnosis not present

## 2017-09-15 DIAGNOSIS — H353211 Exudative age-related macular degeneration, right eye, with active choroidal neovascularization: Secondary | ICD-10-CM | POA: Diagnosis not present

## 2017-09-25 DIAGNOSIS — J3089 Other allergic rhinitis: Secondary | ICD-10-CM | POA: Diagnosis not present

## 2017-09-25 DIAGNOSIS — R05 Cough: Secondary | ICD-10-CM | POA: Diagnosis not present

## 2017-11-17 ENCOUNTER — Encounter: Payer: Self-pay | Admitting: Podiatry

## 2017-11-17 ENCOUNTER — Ambulatory Visit (INDEPENDENT_AMBULATORY_CARE_PROVIDER_SITE_OTHER): Payer: Medicare Other | Admitting: Podiatry

## 2017-11-17 DIAGNOSIS — B351 Tinea unguium: Secondary | ICD-10-CM | POA: Diagnosis not present

## 2017-11-17 DIAGNOSIS — E1142 Type 2 diabetes mellitus with diabetic polyneuropathy: Secondary | ICD-10-CM

## 2017-11-17 DIAGNOSIS — M79609 Pain in unspecified limb: Secondary | ICD-10-CM | POA: Diagnosis not present

## 2017-11-17 NOTE — Progress Notes (Signed)
Complaint:  Visit Type: Patient returns to my office for continued preventative foot care services. Complaint: Patient states" my nails have grown long and thick and become painful to walk and wear shoes" Patient has been diagnosed with DM with no foot complications.  Patient has not been seen in over one year. The patient presents for preventative foot care services. No changes to ROS  Podiatric Exam: Vascular: dorsalis pedis and posterior tibial pulses are palpable bilateral. Capillary return is immediate. Temperature gradient is WNL. Skin turgor WNL  Sensorium: Diminished  Semmes Weinstein monofilament test. Normal tactile sensation bilaterally. Nail Exam: Pt has thick disfigured discolored nails with subungual debris noted bilateral entire nail hallux through fifth toenails Ulcer Exam: There is no evidence of ulcer or pre-ulcerative changes or infection. Orthopedic Exam: Muscle tone and strength are WNL. No limitations in general ROM. No crepitus or effusions noted. Foot type and digits show no abnormalities. Bony prominences are unremarkable. Skin: No Porokeratosis. No infection or ulcers  Diagnosis:  Onychomycosis, , Pain in right toe, pain in left toes  Treatment & Plan Procedures and Treatment: Consent by patient was obtained for treatment procedures. The patient understood the discussion of treatment and procedures well. All questions were answered thoroughly reviewed. Debridement of mycotic and hypertrophic toenails, 1 through 5 bilateral and clearing of subungual debris. No ulceration, no infection noted.  Return Visit-Office Procedure: Patient instructed to return to the office for a follow up visit 3 months for continued evaluation and treatment.    Gardiner Barefoot DPM

## 2017-12-04 DIAGNOSIS — I10 Essential (primary) hypertension: Secondary | ICD-10-CM | POA: Diagnosis not present

## 2017-12-04 DIAGNOSIS — E119 Type 2 diabetes mellitus without complications: Secondary | ICD-10-CM | POA: Diagnosis not present

## 2017-12-05 DIAGNOSIS — E119 Type 2 diabetes mellitus without complications: Secondary | ICD-10-CM | POA: Diagnosis not present

## 2017-12-05 DIAGNOSIS — I10 Essential (primary) hypertension: Secondary | ICD-10-CM | POA: Diagnosis not present

## 2017-12-15 DIAGNOSIS — E113293 Type 2 diabetes mellitus with mild nonproliferative diabetic retinopathy without macular edema, bilateral: Secondary | ICD-10-CM | POA: Diagnosis not present

## 2017-12-15 DIAGNOSIS — Z8673 Personal history of transient ischemic attack (TIA), and cerebral infarction without residual deficits: Secondary | ICD-10-CM | POA: Diagnosis not present

## 2017-12-15 DIAGNOSIS — I10 Essential (primary) hypertension: Secondary | ICD-10-CM | POA: Diagnosis not present

## 2017-12-15 DIAGNOSIS — E119 Type 2 diabetes mellitus without complications: Secondary | ICD-10-CM | POA: Diagnosis not present

## 2017-12-15 DIAGNOSIS — H353212 Exudative age-related macular degeneration, right eye, with inactive choroidal neovascularization: Secondary | ICD-10-CM | POA: Diagnosis not present

## 2017-12-15 DIAGNOSIS — E785 Hyperlipidemia, unspecified: Secondary | ICD-10-CM | POA: Diagnosis not present

## 2017-12-15 DIAGNOSIS — Z23 Encounter for immunization: Secondary | ICD-10-CM | POA: Diagnosis not present

## 2018-02-03 DIAGNOSIS — J019 Acute sinusitis, unspecified: Secondary | ICD-10-CM | POA: Diagnosis not present

## 2018-02-03 DIAGNOSIS — J209 Acute bronchitis, unspecified: Secondary | ICD-10-CM | POA: Diagnosis not present

## 2018-02-03 DIAGNOSIS — J3089 Other allergic rhinitis: Secondary | ICD-10-CM | POA: Diagnosis not present

## 2018-02-03 DIAGNOSIS — R05 Cough: Secondary | ICD-10-CM | POA: Diagnosis not present

## 2018-02-27 DIAGNOSIS — R300 Dysuria: Secondary | ICD-10-CM | POA: Diagnosis not present

## 2018-02-27 DIAGNOSIS — R3915 Urgency of urination: Secondary | ICD-10-CM | POA: Diagnosis not present

## 2018-03-05 ENCOUNTER — Ambulatory Visit: Payer: Medicare Other | Admitting: Internal Medicine

## 2018-03-05 ENCOUNTER — Encounter: Payer: Self-pay | Admitting: Internal Medicine

## 2018-03-05 VITALS — BP 78/44 | HR 88 | Temp 98.2°F | Resp 18 | Wt 122.4 lb

## 2018-03-05 DIAGNOSIS — H353290 Exudative age-related macular degeneration, unspecified eye, stage unspecified: Secondary | ICD-10-CM | POA: Insufficient documentation

## 2018-03-05 DIAGNOSIS — E1142 Type 2 diabetes mellitus with diabetic polyneuropathy: Secondary | ICD-10-CM | POA: Diagnosis not present

## 2018-03-05 DIAGNOSIS — Z7189 Other specified counseling: Secondary | ICD-10-CM

## 2018-03-05 DIAGNOSIS — G459 Transient cerebral ischemic attack, unspecified: Secondary | ICD-10-CM

## 2018-03-05 DIAGNOSIS — I10 Essential (primary) hypertension: Secondary | ICD-10-CM | POA: Insufficient documentation

## 2018-03-05 DIAGNOSIS — Z794 Long term (current) use of insulin: Secondary | ICD-10-CM | POA: Insufficient documentation

## 2018-03-05 DIAGNOSIS — J3089 Other allergic rhinitis: Secondary | ICD-10-CM | POA: Insufficient documentation

## 2018-03-05 DIAGNOSIS — H353210 Exudative age-related macular degeneration, right eye, stage unspecified: Secondary | ICD-10-CM

## 2018-03-05 DIAGNOSIS — E785 Hyperlipidemia, unspecified: Secondary | ICD-10-CM

## 2018-03-05 DIAGNOSIS — E782 Mixed hyperlipidemia: Secondary | ICD-10-CM | POA: Insufficient documentation

## 2018-03-05 MED ORDER — CETIRIZINE HCL 10 MG PO TABS: 10.0000 mg | ORAL_TABLET | Freq: Every day | ORAL | 0 refills | Status: DC

## 2018-03-05 NOTE — Progress Notes (Signed)
Subjective:    Patient ID: Carrie Pratt, female    DOB: January 23, 1925, 85 y.o.   MRN: [id number]  HPI Visit to establish care--in assisted living apartment Reviewed status with Luellen Pucker RN  Her main concern is numbness in feet No sig pain Uses cane for balance Diabetes goes back about 10 years She checks sugars daily---94-110 mostly 1 low sugar spell---had to take OJ No known retinopathy  Has macular degeneration in right eye Was getting shots but not recently Vision is okay with glasses  On HTN meds No chest pain No SOB Occasional dizziness---no falls or syncope Some edema--- resolves by morning Does note her heart beating---if active Not really involved in the exercise programs---recommended  Had CVA in past No lasting deficits Mild memory deficits---maintains functional independence here  Known high cholesterol Continues on the statin  Past IBS No recent problems  Known environmental allergies Uses cetirizine daily and prn montelukast  Current Outpatient Medications on File Prior to Visit  Medication Sig Dispense Refill  . aspirin EC 81 MG tablet Take 81 mg by mouth daily.     . clopidogrel (PLAVIX) 75 MG tablet Take 75 mg by mouth daily.     Marland Kitchen glimepiride (AMARYL) 4 MG tablet Take 4 mg by mouth daily with breakfast.    . lisinopril-hydrochlorothiazide (PRINZIDE,ZESTORETIC) 20-12.5 MG tablet Take 1 tablet by mouth daily.     . metFORMIN (GLUCOPHAGE-XR) 500 MG 24 hr tablet Take 1,000 mg by mouth daily with supper.    . rosuvastatin (CRESTOR) 20 MG tablet Take 20 mg by mouth daily.      No current facility-administered medications on file prior to visit.     Allergies  Allergen Reactions  . Erythromycin Diarrhea    Past Medical History:  Diagnosis Date  . Asthma    as a child  . Bell's palsy   . Diverticulosis   . Environmental and seasonal allergies   . Hyperlipidemia   . Hypertension   . IBS (irritable bowel syndrome)   . Lower extremity edema   .  Migraines   . Osteoporosis   . TIA (transient ischemic attack)   . Type 2 diabetes mellitus with polyneuropathy [facility])     Past Surgical History:  Procedure Laterality Date  . CATARACT EXTRACTION W/PHACO Left 05/29/2016   Procedure: CATARACT EXTRACTION PHACO AND INTRAOCULAR LENS PLACEMENT (IOC);  Surgeon: Estill Cotta, MD;  Location: [facility] ORS;  Service: Ophthalmology;  Laterality: Left;  [place]: 01:48.8 AP% 25.2 CDE: 50.41 [id number]  . CHOLECYSTECTOMY    . COLONOSCOPY    . OOPHORECTOMY    . TONSILLECTOMY      History reviewed. No pertinent family history.  Social History   Socioeconomic History  . Marital status: Widowed    Spouse name: Not on file  . Number of children: 2  . Years of education: Not on file  . Highest education level: Not on file  Occupational History  . 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 Financial resource strain: Not on file  . Food insecurity:    Worry: Not on file    Inability: Not on file  . Transportation needs:    Medical: Not on file    Non-medical: Not on file  Tobacco Use  . Smoking status: Former Smoker    Last attempt to quit: 03/04/1998    Years since quitting: 20.0  . Smokeless tobacco: Never Used  Substance and Sexual Activity  . Alcohol  use: Yes    Comment: occasional  . Drug use: No  . Sexual activity: Not on file  Lifestyle  . Physical activity:    Days per week: Not on file    Minutes per session: Not on file  . Stress: Not on file  Relationships  . Social connections:    Talks on phone: Not on file    Gets together: Not on file    Attends religious service: Not on file    Active member of club or organization: Not on file    Attends meetings of clubs or organizations: Not on file    Relationship status: Not on file  . Intimate partner violence:    Fear of current or ex partner: Not on file    Emotionally abused: Not on file    Physically abused: Not on file     Forced sexual activity: Not on file  Other Topics Concern  . Not on file  Social History Narrative   Widowed 1977   2 sons      Not sure about living will   Sons should be health care POA   Would accept resuscitation   No tube feeds if cognitively unaware   Review of Systems  Constitutional: Negative for fatigue and unexpected weight change.       Not much appetite but weight stable  Eyes: Positive for visual disturbance.       Uses glasses  Respiratory: Negative for cough, chest tightness and shortness of breath.   Cardiovascular: Positive for leg swelling. Negative for chest pain and palpitations.  Gastrointestinal: Positive for anal bleeding and constipation.       Known hemorrhoid Uses stool softener for her bowels No heartburn  Endocrine: Negative for polydipsia and polyuria.  Genitourinary: Negative for dysuria and hematuria.  Musculoskeletal: Negative for arthralgias, back pain and joint swelling.  Skin:       Mild psoriasis---uses OTC creams No suspicious lesions  Allergic/Immunologic: Positive for environmental allergies. Negative for immunocompromised state.  Neurological: Negative for dizziness, syncope and headaches.  Hematological: Negative for adenopathy. Does not bruise/bleed easily.  Psychiatric/Behavioral: Negative for dysphoric mood and sleep disturbance. The patient is not nervous/anxious.        Objective:   Physical Exam  Constitutional: She appears well-developed. No distress.  Neck: No thyromegaly present.  Cardiovascular: Normal rate, regular rhythm, normal heart sounds and intact distal pulses. Exam reveals no gallop.  No murmur heard. Respiratory: Effort normal and breath sounds normal. No respiratory distress. She has no wheezes. She has no rales.  GI: Soft. There is no abdominal tenderness.  Musculoskeletal:        General: No tenderness or edema.  Lymphadenopathy:    She has no cervical adenopathy.  Neurological:  Decreased sensation in  feet  Skin:  No foot lesions           Assessment & Plan:

## 2018-03-05 NOTE — Assessment & Plan Note (Signed)
See social history 

## 2018-03-05 NOTE — Assessment & Plan Note (Signed)
Secondary prevention with statin Will continue

## 2018-03-05 NOTE — Assessment & Plan Note (Signed)
No recent injections Vision is okay

## 2018-03-05 NOTE — Assessment & Plan Note (Signed)
BP Readings from Last 3 Encounters:  03/05/18 (!) 78/44  06/18/17 115/69  05/29/16 116/65   Good control No symptoms so continue meds despite the low reading

## 2018-03-05 NOTE — Assessment & Plan Note (Signed)
Seems to have good control Will check A1c If low sugar reactions, would cut back on glimepiride Discussed---no available Rx for the foot numbness (but fortunately no sig pain)

## 2018-03-05 NOTE — Assessment & Plan Note (Signed)
No residual deficit Discussed DAPT---will stop the ASA

## 2018-03-09 DIAGNOSIS — E119 Type 2 diabetes mellitus without complications: Secondary | ICD-10-CM | POA: Diagnosis not present

## 2018-03-16 DIAGNOSIS — H353212 Exudative age-related macular degeneration, right eye, with inactive choroidal neovascularization: Secondary | ICD-10-CM | POA: Diagnosis not present

## 2018-03-16 LAB — HM DIABETES EYE EXAM

## 2018-03-26 ENCOUNTER — Encounter: Payer: Self-pay | Admitting: Internal Medicine

## 2018-04-02 ENCOUNTER — Observation Stay
Admission: EM | Admit: 2018-04-02 | Discharge: 2018-04-04 | Disposition: A | Discharge status: Home / Self Care | Payer: Medicare Other | Attending: Internal Medicine | Admitting: Internal Medicine

## 2018-04-02 ENCOUNTER — Encounter: Payer: Self-pay | Admitting: Emergency Medicine

## 2018-04-02 ENCOUNTER — Other Ambulatory Visit: Payer: Self-pay

## 2018-04-02 DIAGNOSIS — E785 Hyperlipidemia, unspecified: Secondary | ICD-10-CM | POA: Insufficient documentation

## 2018-04-02 DIAGNOSIS — D649 Anemia, unspecified: Principal | ICD-10-CM | POA: Insufficient documentation

## 2018-04-02 DIAGNOSIS — I10 Essential (primary) hypertension: Secondary | ICD-10-CM | POA: Diagnosis not present

## 2018-04-02 DIAGNOSIS — Z79899 Other long term (current) drug therapy: Secondary | ICD-10-CM | POA: Insufficient documentation

## 2018-04-02 DIAGNOSIS — I959 Hypotension, unspecified: Secondary | ICD-10-CM | POA: Insufficient documentation

## 2018-04-02 DIAGNOSIS — Z7984 Long term (current) use of oral hypoglycemic drugs: Secondary | ICD-10-CM | POA: Diagnosis not present

## 2018-04-02 DIAGNOSIS — E119 Type 2 diabetes mellitus without complications: Secondary | ICD-10-CM | POA: Diagnosis not present

## 2018-04-02 DIAGNOSIS — K589 Irritable bowel syndrome without diarrhea: Secondary | ICD-10-CM | POA: Diagnosis not present

## 2018-04-02 DIAGNOSIS — K648 Other hemorrhoids: Secondary | ICD-10-CM | POA: Diagnosis not present

## 2018-04-02 DIAGNOSIS — E1142 Type 2 diabetes mellitus with diabetic polyneuropathy: Secondary | ICD-10-CM | POA: Diagnosis not present

## 2018-04-02 DIAGNOSIS — Z881 Allergy status to other antibiotic agents status: Secondary | ICD-10-CM | POA: Insufficient documentation

## 2018-04-02 DIAGNOSIS — K649 Unspecified hemorrhoids: Secondary | ICD-10-CM | POA: Diagnosis not present

## 2018-04-02 DIAGNOSIS — Z8673 Personal history of transient ischemic attack (TIA), and cerebral infarction without residual deficits: Secondary | ICD-10-CM | POA: Diagnosis not present

## 2018-04-02 DIAGNOSIS — Z0189 Encounter for other specified special examinations: Secondary | ICD-10-CM | POA: Diagnosis not present

## 2018-04-02 DIAGNOSIS — Z9049 Acquired absence of other specified parts of digestive tract: Secondary | ICD-10-CM | POA: Diagnosis not present

## 2018-04-02 DIAGNOSIS — Z7902 Long term (current) use of antithrombotics/antiplatelets: Secondary | ICD-10-CM | POA: Insufficient documentation

## 2018-04-02 DIAGNOSIS — Z87891 Personal history of nicotine dependence: Secondary | ICD-10-CM | POA: Insufficient documentation

## 2018-04-02 LAB — COMPREHENSIVE METABOLIC PANEL
ALK PHOS: 74 U/L (ref 38–126)
ALT: 11 U/L (ref 0–44)
ANION GAP: 6 (ref 5–15)
AST: 20 U/L (ref 15–41)
Albumin: 4 g/dL (ref 3.5–5.0)
BUN: 28 mg/dL — ABNORMAL HIGH (ref 8–23)
CALCIUM: 8.9 mg/dL (ref 8.9–10.3)
CO2: 28 mmol/L (ref 22–32)
Chloride: 102 mmol/L (ref 98–111)
Creatinine, Ser: 1.13 mg/dL — ABNORMAL HIGH (ref 0.44–1.00)
GFR calc non Af Amer: 42 mL/min — ABNORMAL LOW (ref >60)
GFR, EST AFRICAN AMERICAN: 49 mL/min — AB (ref >60)
Glucose, Bld: 126 mg/dL — ABNORMAL HIGH (ref 70–99)
POTASSIUM: 4 mmol/L (ref 3.5–5.1)
SODIUM: 136 mmol/L (ref 135–145)
Total Bilirubin: 0.4 mg/dL (ref 0.3–1.2)
Total Protein: 6.6 g/dL (ref 6.5–8.1)

## 2018-04-02 LAB — RETICULOCYTES
IMMATURE RETIC FRACT: 30.7 % — AB (ref 2.3–15.9)
RBC.: 3.04 MIL/uL — ABNORMAL LOW (ref 3.87–5.11)
Retic Count, Absolute: 55.9 10*3/uL (ref 19.0–186.0)
Retic Ct Pct: 1.8 % (ref 0.4–3.1)

## 2018-04-02 LAB — CBC
HCT: 23.4 % — ABNORMAL LOW (ref 36.0–46.0)
HEMOGLOBIN: 6.9 g/dL — AB (ref 12.0–15.0)
MCH: 22.5 pg — ABNORMAL LOW (ref 26.0–34.0)
MCHC: 29.5 g/dL — ABNORMAL LOW (ref 30.0–36.0)
MCV: 76.5 fL — AB (ref 80.0–100.0)
NRBC: 0 % (ref 0.0–0.2)
PLATELETS: 451 10*3/uL — AB (ref 150–400)
RBC: 3.06 MIL/uL — AB (ref 3.87–5.11)
RDW: 17 % — AB (ref 11.5–15.5)
WBC: 7.7 10*3/uL (ref 4.0–10.5)

## 2018-04-02 LAB — GLUCOSE, CAPILLARY: Glucose-Capillary: 138 mg/dL — ABNORMAL HIGH (ref 70–99)

## 2018-04-02 LAB — PREPARE RBC (CROSSMATCH)

## 2018-04-02 LAB — IRON AND TIBC
Iron: 15 ug/dL — ABNORMAL LOW (ref 28–170)
Saturation Ratios: 3 % — ABNORMAL LOW (ref 10.4–31.8)
TIBC: 513 ug/dL — ABNORMAL HIGH (ref 250–450)
UIBC: 498 ug/dL

## 2018-04-02 LAB — FOLATE: Folate: 11.4 ng/mL (ref >5.9)

## 2018-04-02 LAB — VITAMIN B12: VITAMIN B 12: 318 pg/mL (ref 180–914)

## 2018-04-02 LAB — FERRITIN: Ferritin: 6 ng/mL — ABNORMAL LOW (ref 11–307)

## 2018-04-02 LAB — ABO/RH: ABO/RH(D): O POS

## 2018-04-02 MED ORDER — LISINOPRIL 20 MG PO TABS: 20.0000 mg | ORAL_TABLET | Freq: Every day | ORAL | Status: DC

## 2018-04-02 MED ORDER — METFORMIN HCL ER 500 MG PO TB24
1000.0000 mg | ORAL_TABLET | Freq: Every day | ORAL | Status: DC
  Administered 2018-04-03: 1000 mg via ORAL
  Filled 2018-04-02 (×2): qty 2

## 2018-04-02 MED ORDER — ONDANSETRON HCL 4 MG/2ML IJ SOLN: 4.0000 mg | Freq: Four times a day (QID) | INTRAMUSCULAR | Status: DC | PRN

## 2018-04-02 MED ORDER — INSULIN ASPART 100 UNIT/ML ~~LOC~~ SOLN: 0.0000 [IU] | Freq: Every day | SUBCUTANEOUS | Status: DC

## 2018-04-02 MED ORDER — LORATADINE 10 MG PO TABS
10.0000 mg | ORAL_TABLET | Freq: Every day | ORAL | Status: DC
  Administered 2018-04-02 – 2018-04-04 (×3): 10 mg via ORAL
  Filled 2018-04-02 (×3): qty 1

## 2018-04-02 MED ORDER — HYDROCHLOROTHIAZIDE 12.5 MG PO CAPS: 12.5000 mg | ORAL_CAPSULE | Freq: Every day | ORAL | Status: DC

## 2018-04-02 MED ORDER — GLIMEPIRIDE 4 MG PO TABS
4.0000 mg | ORAL_TABLET | Freq: Every day | ORAL | Status: DC
  Administered 2018-04-03 – 2018-04-04 (×2): 4 mg via ORAL
  Filled 2018-04-02 (×4): qty 1

## 2018-04-02 MED ORDER — LISINOPRIL-HYDROCHLOROTHIAZIDE 20-12.5 MG PO TABS: 1.0000 | ORAL_TABLET | Freq: Every day | ORAL | Status: DC

## 2018-04-02 MED ORDER — ROSUVASTATIN CALCIUM 10 MG PO TABS
20.0000 mg | ORAL_TABLET | Freq: Every day | ORAL | Status: DC
  Administered 2018-04-03 – 2018-04-04 (×2): 20 mg via ORAL
  Filled 2018-04-02 (×2): qty 2

## 2018-04-02 MED ORDER — ONDANSETRON HCL 4 MG PO TABS: 4.0000 mg | ORAL_TABLET | Freq: Four times a day (QID) | ORAL | Status: DC | PRN

## 2018-04-02 MED ORDER — MONTELUKAST SODIUM 10 MG PO TABS: 10.0000 mg | ORAL_TABLET | Freq: Every day | ORAL | Status: DC | PRN

## 2018-04-02 MED ORDER — ACETAMINOPHEN 650 MG RE SUPP: 650.0000 mg | Freq: Four times a day (QID) | RECTAL | Status: DC | PRN

## 2018-04-02 MED ORDER — ACETAMINOPHEN 325 MG PO TABS: 650.0000 mg | ORAL_TABLET | Freq: Four times a day (QID) | ORAL | Status: DC | PRN

## 2018-04-02 MED ORDER — SODIUM CHLORIDE 0.9 % IV SOLN
10.0000 mL/h | Freq: Once | INTRAVENOUS | Status: AC
  Administered 2018-04-02: 10 mL/h via INTRAVENOUS

## 2018-04-02 MED ORDER — INSULIN ASPART 100 UNIT/ML ~~LOC~~ SOLN
0.0000 [IU] | Freq: Three times a day (TID) | SUBCUTANEOUS | Status: DC
  Administered 2018-04-03 (×2): 1 [IU] via SUBCUTANEOUS
  Filled 2018-04-02 (×2): qty 1

## 2018-04-02 NOTE — Consult Note (Signed)
Date of Consultation:  04/02/2018  Requesting Physician:  Abel Presto, MD  Reason for Consultation:  Bleeding hemorrhoids  History of Present Illness: Carrie Pratt is a 85 y.o. female presenting to the ED with symptomatic anemia in the context of bleeding hemorrhoids.  The patient has had issues with lightheadedness, shortness of breath with exertion, and fatigue over the past couple of months.  She reports having blood per rectum with bowel movements, but also has noticed some blood in her underwear and gowns.  She has a history of bleeding hemorrhoids which were banded x3 in 2016.  She also has a history of CVA and was taking both ASA and Plavix, but her new PCP has changed her to Plavix alone recently.  Denies any perianal pain.  Endorses constipation and has a bowel movement every other day at the most, and the stool is hard and she strains for a bowel movement.  She takes stool softener as needed.  She does not remember when her last colonoscopy was, but on records, may have been in 2000.  Past Medical History: Past Medical History:  Diagnosis Date  . Asthma    as a child  . Bell's palsy   . Diverticulosis   . Environmental and seasonal allergies   . Hyperlipidemia   . Hypertension   . IBS (irritable bowel syndrome)   . Lower extremity edema   . Macular degeneration, wet ([place])   . Migraines   . Osteoporosis   . TIA (transient ischemic attack)   . Type 2 diabetes mellitus with polyneuropathy [facility])      Past Surgical History: Past Surgical History:  Procedure Laterality Date  . CATARACT EXTRACTION W/PHACO Left 05/29/2016   Procedure: CATARACT EXTRACTION PHACO AND INTRAOCULAR LENS PLACEMENT (IOC);  Surgeon: Estill Cotta, MD;  Location: [facility] ORS;  Service: Ophthalmology;  Laterality: Left;  [place]: 01:48.8 AP% 25.2 CDE: 50.41 [id number]  . CHOLECYSTECTOMY    . COLONOSCOPY    . OOPHORECTOMY    . TONSILLECTOMY      Home Medications: Prior to Admission medications    Medication Sig Start Date End Date Taking? Authorizing Provider  acetaminophen (TYLENOL) 325 MG tablet Take 650 mg by mouth every 4 (four) hours as needed.   Yes [provider]  albuterol (VENTOLIN HFA) 108 (90 Base) MCG/ACT inhaler USE 1-2 PUFFS AS NEEDED EVERY 4-6 HRS AS NEEDED FOR COUGH OR WHEEZE **MUST LAST 90DAYS PER MD** 02/18/17  Yes [provider]  cetirizine (ZYRTEC) 10 MG tablet Take 1 tablet (10 mg total) by mouth daily. 03/05/18  Yes Venia Carbon, MD  clopidogrel (PLAVIX) 75 MG tablet Take 75 mg by mouth daily.  03/14/15  Yes [provider]  glimepiride (AMARYL) 4 MG tablet Take 4 mg by mouth daily with breakfast.   Yes [provider]  lisinopril-hydrochlorothiazide (PRINZIDE,ZESTORETIC) 20-12.5 MG tablet Take 1 tablet by mouth daily.  05/30/15  Yes [provider]  metFORMIN (GLUCOPHAGE-XR) 500 MG 24 hr tablet Take 1,000 mg by mouth daily at 6 PM. at   Yes [provider]  montelukast (SINGULAIR) 10 MG tablet Take 10 mg by mouth daily as needed (allergies).   Yes [provider]  ondansetron (ZOFRAN) 4 MG tablet Take 4 mg by mouth 3 (three) times daily.   Yes [provider]  rosuvastatin (CRESTOR) 20 MG tablet Take 20 mg by mouth daily.  01/04/15 04/02/18 Yes [provider]    Allergies: Allergies  Allergen Reactions  . Erythromycin  Diarrhea    Social History:  reports that she quit smoking about 20 years ago. She has never used smokeless tobacco. She reports current alcohol use. She reports that she does not use drugs.   Family History: No family history on file.  Review of Systems: Review of Systems  Constitutional: Positive for malaise/fatigue. Negative for chills and fever.  HENT: Negative for hearing loss.   Respiratory: Positive for shortness of breath.   Cardiovascular: Negative for chest pain.  Gastrointestinal: Positive for blood in stool and constipation. Negative for  abdominal pain, nausea and vomiting.  Genitourinary: Negative for dysuria.  Musculoskeletal: Negative for myalgias.  Skin: Negative for rash.  Neurological: Positive for dizziness.  Psychiatric/Behavioral: Negative for depression.    Physical Exam BP (!) 101/49 (BP Location: Right Arm)   Pulse 86   Temp 98.6 F (37 C) (Oral)   Resp 18   Ht 5\' 2"  (1.575 m)   Wt 56.7 kg   SpO2 100%   BMI 22.86 kg/m  CONSTITUTIONAL: No acute distress HEENT:  Normocephalic, atraumatic, extraocular motion intact. NECK: Trachea is midline, and there is no jugular venous distension. RESPIRATORY:  Lungs are clear, and breath sounds are equal bilaterally. Normal respiratory effort without pathologic use of accessory muscles. CARDIOVASCULAR: Heart is regular without murmurs, gallops, or rubs. GI: The abdomen is soft, non-distended, non-tender to palpation.  RECTAL:  External exam reveals mildly enlarged external hemorrhoids, with also enlarged internal components.  There are areas of raw tissue consistent with recent bleeding, but no active bleeding at this time.  The right anterior and posterior columns are the most enlarged.  Digital exam does not reveal any masses or lesions. MUSCULOSKELETAL:  Normal muscle strength and tone in all four extremities.  No peripheral edema or cyanosis. SKIN: Skin turgor is normal. There are no pathologic skin lesions.  NEUROLOGIC:  Motor and sensation is grossly normal.  Cranial nerves are grossly intact. PSYCH:  Alert and oriented to person, place and time. Affect is normal.  Laboratory Analysis: Results for orders placed or performed during the hospital encounter of 04/02/18 (from the past 24 hour(s))  Comprehensive metabolic panel     Status: Abnormal   Collection Time: 04/02/18  2:27 PM  Result Value Ref Range   Sodium 136 135 - 145 mmol/L   Potassium 4.0 3.5 - 5.1 mmol/L   Chloride 102 98 - 111 mmol/L   CO2 28 22 - 32 mmol/L   Glucose, Bld 126 (H) 70 - 99 mg/dL    BUN 28 (H) 8 - 23 mg/dL   Creatinine, Ser 1.13 (H) 0.44 - 1.00 mg/dL   Calcium 8.9 8.9 - 10.3 mg/dL   Total Protein 6.6 6.5 - 8.1 g/dL   Albumin 4.0 3.5 - 5.0 g/dL   AST 20 15 - 41 U/L   ALT 11 0 - 44 U/L   Alkaline Phosphatase 74 38 - 126 U/L   Total Bilirubin 0.4 0.3 - 1.2 mg/dL   GFR calc non Af Amer 42 (L) >60 mL/min   GFR calc Af Amer 49 (L) >60 mL/min   Anion gap 6 5 - 15  CBC     Status: Abnormal   Collection Time: 04/02/18  2:27 PM  Result Value Ref Range   WBC 7.7 4.0 - 10.5 K/uL   RBC 3.06 (L) 3.87 - 5.11 MIL/uL   Hemoglobin 6.9 (L) 12.0 - 15.0 g/dL   HCT 23.4 (L) 36.0 - 46.0 %   MCV 76.5 (L) 80.0 -  100.0 fL   MCH 22.5 (L) 26.0 - 34.0 pg   MCHC 29.5 (L) 30.0 - 36.0 g/dL   RDW 17.0 (H) 11.5 - 15.5 %   Platelets 451 (H) 150 - 400 K/uL   nRBC 0.0 0.0 - 0.2 %  Type and screen [facility]     Status: None (Preliminary result)   Collection Time: 04/02/18  2:27 PM  Result Value Ref Range   ABO/RH(D) O POS    Antibody Screen NEG    Sample Expiration 04/05/2018    Unit Number [id number]    Blood Component Type RED CELLS,LR    Unit division 00    Status of Unit ISSUED    Transfusion Status OK TO TRANSFUSE    Crossmatch Result      Compatible Performed at [facility], [address] [place], [place] [address]   Folate     Status: None   Collection Time: 04/02/18  2:27 PM  Result Value Ref Range   Folate 11.4 >5.9 ng/mL  Iron and TIBC     Status: Abnormal   Collection Time: 04/02/18  2:27 PM  Result Value Ref Range   Iron 15 (L) 28 - 170 ug/dL   TIBC 513 (H) 250 - 450 ug/dL   Saturation Ratios 3 (L) 10.4 - 31.8 %   UIBC 498 ug/dL  Ferritin     Status: Abnormal   Collection Time: 04/02/18  2:27 PM  Result Value Ref Range   Ferritin 6 (L) 11 - 307 ng/mL  Reticulocytes     Status: Abnormal   Collection Time: 04/02/18  2:27 PM  Result Value Ref Range   Retic Ct Pct 1.8 0.4 - 3.1 %   RBC. 3.04 (L) 3.87 - 5.11 MIL/uL   Retic  Count, Absolute 55.9 19.0 - 186.0 K/uL   Immature Retic Fract 30.7 (H) 2.3 - 15.9 %  Prepare RBC     Status: None   Collection Time: 04/02/18  4:06 PM  Result Value Ref Range   Order Confirmation      ORDER PROCESSED BY BLOOD BANK Performed at [facility], [address] [place], [place]  [address]   ABO/Rh     Status: None   Collection Time: 04/02/18  4:06 PM  Result Value Ref Range   ABO/RH(D)      O POS Performed at [facility], [address] [place], [place] [address]     Imaging: No results found.  Assessment and Plan: This is a 85 y.o. female with symptomatic anemia and bleeding hemorrhoids.  The patient is being admitted to medicine and will be transfused pRBC for her anemia.  Discussed with the patient and her son that the initial treatment for hemorrhoids is conservative with treatment/changes of her bowel habits.  At this point as she's not actively or massively bleeding, there is no need for urgent or emergent hemorrhoidectomy.  Recommend holding her Plavix for now and star the patient on Metamucil or Benefiber daily, as well as Miralax daily to keep her bowels soft and have a daily bowel movement.  She should not have to strain.  She can also do Sitz baths while in hospital and at home.  Discussed with the patient and her son that if conservative management does not help with the intermittent bleeding, then she would require surgery in the form of hemorrhoidectomy.  Given her age, it is very reasonable to attempt conservative management as long as bleeding is not an  issue with symptomatic anemia.  Will follow along with you.  Face-to-face time spent with the patient and care providers was 55 minutes, with more than 50% of the time spent counseling, educating, and coordinating care of the patient.     Melvyn Neth, MD [facility] Pg:  [phone]

## 2018-04-02 NOTE — ED Notes (Signed)
Pt assisted to bed side commode at this time. Pt in NAD and able to walk with standby assist. Pt assisted back to stretcher at this time.

## 2018-04-02 NOTE — H&P (Signed)
[place] at [place]    MR#:  [id number]  DATE OF BIRTH:  [birth year]  DATE OF ADMISSION:  04/02/2018  PRIMARY CARE PHYSICIAN: Venia Carbon, MD   REQUESTING/REFERRING PHYSICIAN: Dr. Conni Slipper  CHIEF COMPLAINT:   Chief Complaint  Patient presents with  . Abnormal Lab    HISTORY OF PRESENT ILLNESS:  Carrie Pratt  is a 85 y.o. female with a known history of hypertension, hyperlipidemia, diverticulosis, IBS, macular degeneration, history of migraines, TIA, diabetes who presents to the hospital due to shortness of breath, dizziness and noted to have symptomatic anemia.  Patient has just moved to independent living at [facility] and saw a physician there who checked routine blood work and noted for her to have a hemoglobin of 6.0, and advised her to come to the ER for further evaluation.  In the emergency room patient was also noted to have a low hemoglobin and therefore hospitalist services contacted for admission.  Patient denies any melena, hematochezia, hematuria.  She does have a history of hemorrhoids and says that she has had some intermittent bleeding from her hemorrhoids.  She has had the hemorrhoids banded but many years ago, she denies any chest pains, nausea, vomiting, hematemesis.  She admits to exertional dyspnea and weakness when ambulating.  She is being admitted for symptomatic anemia.  PAST MEDICAL HISTORY:   Past Medical History:  Diagnosis Date  . Asthma    as a child  . Bell's palsy   . Diverticulosis   . Environmental and seasonal allergies   . Hyperlipidemia   . Hypertension   . IBS (irritable bowel syndrome)   . Lower extremity edema   . Macular degeneration, wet ([place])   . Migraines   . Osteoporosis   . TIA (transient ischemic attack)   . Type 2 diabetes mellitus with polyneuropathy ([place])     PAST SURGICAL HISTORY:   Past Surgical History:  Procedure Laterality Date  . CATARACT  EXTRACTION W/PHACO Left 05/29/2016   Procedure: CATARACT EXTRACTION PHACO AND INTRAOCULAR LENS PLACEMENT (IOC);  Surgeon: Estill Cotta, MD;  Location: [facility] ORS;  Service: Ophthalmology;  Laterality: Left;  [place]: 01:48.8 AP% 25.2 CDE: 50.41 AST#[id number] H  . CHOLECYSTECTOMY    . COLONOSCOPY    . OOPHORECTOMY    . TONSILLECTOMY      SOCIAL HISTORY:   Social History   Tobacco Use  . Smoking status: Former Smoker    Last attempt to quit: 03/04/1998    Years since quitting: 20.0  . Smokeless tobacco: Never Used  Substance Use Topics  . Alcohol use: Yes    Comment: occasional    FAMILY HISTORY:  No family history on file.  DRUG ALLERGIES:   Allergies  Allergen Reactions  . Erythromycin Diarrhea    REVIEW OF SYSTEMS:   Review of Systems  Constitutional: Negative for fever and weight loss.  HENT: Negative for congestion, nosebleeds and tinnitus.   Eyes: Negative for blurred vision, double vision and redness.  Respiratory: Positive for shortness of breath (Exertion). Negative for cough and hemoptysis.   Cardiovascular: Negative for chest pain, orthopnea, leg swelling and PND.  Gastrointestinal: Negative for abdominal pain, diarrhea, melena, nausea and vomiting.  Genitourinary: Negative for dysuria, hematuria and urgency.  Musculoskeletal: Negative for falls and joint pain.  Neurological: Positive for dizziness and weakness. Negative for tingling, sensory change, focal weakness, seizures and headaches.  Endo/Heme/Allergies: Negative for polydipsia. Does not bruise/bleed easily.  Psychiatric/Behavioral: Negative for depression and memory loss. The patient is not nervous/anxious.     MEDICATIONS AT HOME:   Prior to Admission medications   Medication Sig Start Date End Date Taking? Authorizing Provider  cetirizine (ZYRTEC) 10 MG tablet Take 1 tablet (10 mg total) by mouth daily. 03/05/18   Venia Carbon, MD  clopidogrel (PLAVIX) 75 MG tablet Take 75 mg by mouth daily.   03/14/15   [provider]  glimepiride (AMARYL) 4 MG tablet Take 4 mg by mouth daily with breakfast.    [provider]  lisinopril-hydrochlorothiazide (PRINZIDE,ZESTORETIC) 20-12.5 MG tablet Take 1 tablet by mouth daily.  05/30/15   [provider]  metFORMIN (GLUCOPHAGE-XR) 500 MG 24 hr tablet Take 1,000 mg by mouth daily with supper.    [provider]  rosuvastatin (CRESTOR) 20 MG tablet Take 20 mg by mouth daily.  01/04/15 05/24/16  [provider]      VITAL SIGNS:  Blood pressure (!) 103/44, pulse 87, temperature 98 F (36.7 C), temperature source Oral, resp. rate (!) 23, height 5\' 2"  (1.575 m), weight 56.7 kg, SpO2 99 %.  PHYSICAL EXAMINATION:  Physical Exam  GENERAL:  85 y.o.-year-old patient lying in the bed with no acute distress.  EYES: Pupils equal, round, reactive to light and accommodation. No scleral icterus. Pale conjunctiva. Extraocular muscles intact.  HEENT: Head atraumatic, normocephalic. Oropharynx and nasopharynx clear. No oropharyngeal erythema, moist oral mucosa  NECK:  Supple, no jugular venous distention. No thyroid enlargement, no tenderness.  LUNGS: Normal breath sounds bilaterally, no wheezing, rales, rhonchi. No use of accessory muscles of respiration.  CARDIOVASCULAR: S1, S2 RRR. No murmurs, rubs, gallops, clicks.  ABDOMEN: Soft, nontender, nondistended. Bowel sounds present. No organomegaly or mass.  EXTREMITIES: No pedal edema, cyanosis, or clubbing. + 2 pedal & radial pulses b/l.   NEUROLOGIC: Cranial nerves II through XII are intact. No focal Motor or sensory deficits appreciated b/l PSYCHIATRIC: The patient is alert and oriented x 3. SKIN: No obvious rash, lesion, or ulcer.   LABORATORY PANEL:   CBC Recent Labs  Lab 04/02/18 1427  WBC 7.7  HGB 6.9*  HCT 23.4*  PLT 451*   ------------------------------------------------------------------------------------------------------------------  Chemistries    Recent Labs  Lab 04/02/18 1427  NA 136  K 4.0  CL 102  CO2 28  GLUCOSE 126*  BUN 28*  CREATININE 1.13*  CALCIUM 8.9  AST 20  ALT 11  ALKPHOS 74  BILITOT 0.4   ------------------------------------------------------------------------------------------------------------------  Cardiac Enzymes No results for input(s): TROPONINI in the last 168 hours. ------------------------------------------------------------------------------------------------------------------  RADIOLOGY:  No results found.   IMPRESSION AND PLAN:   85 year old female with past medical history of diabetes, hypertension, hyperlipidemia, IBS, previous history of TIA who presents to the hospital due to dizziness, weakness and noted to have symptomatic anemia.  1.  Symptomatic anemia-this is a cause of patient's dizziness exertional dyspnea and weakness. - Hemoglobin is down to as low as 6.9 on admission.  Patient's baseline/previous hemoglobin in April of last year was over 9. - Patient will be transfused 2 units of packed red blood cells, will follow hemoglobin.  Check iron studies along with B12 and folate and reticulocyte count. - The source of the anemia is likely a intermittent bleeding hemorrhoid.  2.  Hemorrhoids-source of patient's symptomatic anemia. - I will get a surgical consult, discussed with Dr. Hampton Abbot.  3.  Of previous TIA-hold Plavix given the anemia.  4.  Essential hypertension- continue lisinopril/HCTZ.  5.  Hyperlipidemia-continue Crestor.  6.  Diabetes type 2 with neuropathy- hold glimepiride, metformin.  Will place on sliding scale insulin for now.    All the records are reviewed and case discussed with ED provider. Management plans discussed with the patient, family and they are in agreement.  CODE STATUS: Full code  TOTAL TIME TAKING CARE OF THIS PATIENT: 45 minutes.    Henreitta Leber M.D on 04/02/2018 at 4:21 PM  Between 7am to 6pm - Pager - [phone]  After 6pm go  to [email or web address] - password [email or web address] [facility]  Office  [phone]  CC: Primary care physician; Venia Carbon, MD

## 2018-04-02 NOTE — ED Triage Notes (Signed)
Pt was sent by PCP for a low hemoglobin (6.0). Pt denies abnormal stools or signs of bleeding. Pt denies pain or other complaints at this time.

## 2018-04-02 NOTE — ED Provider Notes (Signed)
[facility] Emergency Department Provider Note   ____________________________________________   First MD Initiated Contact with Patient 04/02/18 1454     (approximate)  I have reviewed the triage vital signs and the nursing notes.   HISTORY  Chief Complaint Abnormal Lab    HPI Carrie Pratt is a 85 y.o. female who comes from the doctor's office with a low blood count.  She says she is been tired and little woozy and lightheaded.  She denies any black tarry stool.  She has had some rectal bleeding from hemorrhoid.  She denies any medical problems.  See below past medical history   Past Medical History:  Diagnosis Date  . Asthma    as a child  . Bell's palsy   . Diverticulosis   . Environmental and seasonal allergies   . Hyperlipidemia   . Hypertension   . IBS (irritable bowel syndrome)   . Lower extremity edema   . Macular degeneration, wet ([place])   . Migraines   . Osteoporosis   . TIA (transient ischemic attack)   . Type 2 diabetes mellitus with polyneuropathy [facility])     Patient Active Problem List   Diagnosis Date Noted  . Advance directive discussed with patient 03/05/2018  . Type 2 diabetes mellitus with polyneuropathy ([place])   . Hypertension   . Hyperlipidemia   . Environmental and seasonal allergies   . Macular degeneration, wet ([place])   . TIA (transient ischemic attack)     Past Surgical History:  Procedure Laterality Date  . CATARACT EXTRACTION W/PHACO Left 05/29/2016   Procedure: CATARACT EXTRACTION PHACO AND INTRAOCULAR LENS PLACEMENT (IOC);  Surgeon: Estill Cotta, MD;  Location: [facility] ORS;  Service: Ophthalmology;  Laterality: Left;  [place]: 01:48.8 AP% 25.2 CDE: 50.41 RJJ#[id number] H  . CHOLECYSTECTOMY    . COLONOSCOPY    . OOPHORECTOMY    . TONSILLECTOMY      Prior to Admission medications   Medication Sig Start Date End Date Taking? Authorizing Provider  cetirizine (ZYRTEC) 10 MG tablet Take 1 tablet (10 mg total) by  mouth daily. 03/05/18   Venia Carbon, MD  clopidogrel (PLAVIX) 75 MG tablet Take 75 mg by mouth daily.  03/14/15   [provider]  glimepiride (AMARYL) 4 MG tablet Take 4 mg by mouth daily with breakfast.    [provider]  lisinopril-hydrochlorothiazide (PRINZIDE,ZESTORETIC) 20-12.5 MG tablet Take 1 tablet by mouth daily.  05/30/15   [provider]  metFORMIN (GLUCOPHAGE-XR) 500 MG 24 hr tablet Take 1,000 mg by mouth daily with supper.    [provider]  rosuvastatin (CRESTOR) 20 MG tablet Take 20 mg by mouth daily.  01/04/15 05/24/16  [provider]    Allergies Erythromycin  No family history on file.  Social History Social History   Tobacco Use  . Smoking status: Former Smoker    Last attempt to quit: 03/04/1998    Years since quitting: 20.0  . Smokeless tobacco: Never Used  Substance Use Topics  . Alcohol use: Yes    Comment: occasional  . Drug use: No    Review of Systems  Constitutional: No fever/chills Eyes: No visual changes. ENT: No sore throat. Cardiovascular: Denies chest pain. Respiratory: Denies shortness of breath. Gastrointestinal: No abdominal pain.  No nausea, no vomiting.  No diarrhea.  No constipation. Genitourinary: Negative for dysuria. Musculoskeletal: Negative for back pain. Skin: Negative for rash. Neurological: Negative for headaches, focal weakness   ____________________________________________   PHYSICAL EXAM:  VITAL SIGNS: ED Triage Vitals  Enc Vitals Group     BP 04/02/18 1420 (!) 108/55     Pulse Rate 04/02/18 1420 98     Resp 04/02/18 1420 18     Temp 04/02/18 1420 98 F (36.7 C)     Temp Source 04/02/18 1420 Oral     SpO2 04/02/18 1420 100 %     Weight 04/02/18 1421 125 lb (56.7 kg)     Height 04/02/18 1421 5\' 2"  (1.575 m)     Head Circumference --      Peak Flow --      Pain Score 04/02/18 1425 0     Pain Loc --      Pain Edu? --      Excl. in [place]? --     Constitutional:  Alert and oriented.  Pale but well appearing and in no acute distress. Eyes: Conjunctivae are normal.  Head: Atraumatic. Nose: No congestion/rhinnorhea. Mouth/Throat: Mucous membranes are moist.  Oropharynx non-erythematous. Neck: No stridor.   Cardiovascular: Normal rate, regular rhythm. Grossly normal heart sounds.   Respiratory: Normal respiratory effort.  No retractions. Lungs CTAB. Gastrointestinal: Soft and nontender. No distention. No abdominal bruits.  Rectal: There is what appears to be a hemorrhoid present at the anal verge.  This is slightly tender but looks like it has bled.  Patient reports she has had hemorrhoidal bleeding.  The stool itself is Hemoccult negative I do not feel any masses in the rectum. }Musculoskeletal: No lower extremity tenderness nor edema.   Neurologic:  Normal speech and language. No gross focal neurologic deficits are appreciated. N Skin:  Skin is pale, warm, dry and intact. No rash noted. Psychiatric: Mood and affect are normal. Speech and behavior are normal.  ____________________________________________   LABS (all labs ordered are listed, but only abnormal results are displayed)  Labs Reviewed  COMPREHENSIVE METABOLIC PANEL - Abnormal; Notable for the following components:      Result Value   Glucose, Bld 126 (*)    BUN 28 (*)    Creatinine, Ser 1.13 (*)    GFR calc non Af Amer 42 (*)    GFR calc Af Amer 49 (*)    All other components within normal limits  CBC - Abnormal; Notable for the following components:   RBC 3.06 (*)    Hemoglobin 6.9 (*)    HCT 23.4 (*)    MCV 76.5 (*)    MCH 22.5 (*)    MCHC 29.5 (*)    RDW 17.0 (*)    Platelets 451 (*)    All other components within normal limits  POC OCCULT BLOOD, ED  TYPE AND SCREEN  PREPARE RBC (CROSSMATCH)   ____________________________________________  EKG   ____________________________________________  RADIOLOGY  ED MD interpretation:   Official radiology report(s): No  results found.  ____________________________________________   PROCEDURES  Procedure(s) performed:  Procedures  Critical Care performed:   ____________________________________________   INITIAL IMPRESSION / ASSESSMENT AND PLAN / ED COURSE  Obtained oral consent for transfusion from the patient.  Will type and cross her and begin transfusion.  Dr. Terrace Arabia will watch her in the hospital and this happens.          ____________________________________________   FINAL CLINICAL IMPRESSION(S) / ED DIAGNOSES  Final diagnoses:  Symptomatic anemia     ED Discharge Orders    None       Note:  This document was prepared using Dragon voice recognition software and may include  unintentional dictation errors.    Nena Polio, MD 04/02/18 1535

## 2018-04-03 DIAGNOSIS — K649 Unspecified hemorrhoids: Secondary | ICD-10-CM | POA: Diagnosis not present

## 2018-04-03 DIAGNOSIS — D649 Anemia, unspecified: Secondary | ICD-10-CM | POA: Diagnosis not present

## 2018-04-03 DIAGNOSIS — K648 Other hemorrhoids: Secondary | ICD-10-CM | POA: Diagnosis not present

## 2018-04-03 DIAGNOSIS — I10 Essential (primary) hypertension: Secondary | ICD-10-CM | POA: Diagnosis not present

## 2018-04-03 DIAGNOSIS — E119 Type 2 diabetes mellitus without complications: Secondary | ICD-10-CM | POA: Diagnosis not present

## 2018-04-03 LAB — CBC
HEMATOCRIT: 27.4 % — AB (ref 36.0–46.0)
Hemoglobin: 8.4 g/dL — ABNORMAL LOW (ref 12.0–15.0)
MCH: 23.9 pg — ABNORMAL LOW (ref 26.0–34.0)
MCHC: 30.7 g/dL (ref 30.0–36.0)
MCV: 78.1 fL — ABNORMAL LOW (ref 80.0–100.0)
Platelets: 377 10*3/uL (ref 150–400)
RBC: 3.51 MIL/uL — ABNORMAL LOW (ref 3.87–5.11)
RDW: 17.5 % — ABNORMAL HIGH (ref 11.5–15.5)
WBC: 6.9 10*3/uL (ref 4.0–10.5)
nRBC: 0 % (ref 0.0–0.2)

## 2018-04-03 LAB — TYPE AND SCREEN
ABO/RH(D): O POS
Antibody Screen: NEGATIVE
Unit division: 0

## 2018-04-03 LAB — BPAM RBC
Blood Product Expiration Date: 202002292359
ISSUE DATE / TIME: 202001301821
Unit Type and Rh: 5100

## 2018-04-03 LAB — GLUCOSE, CAPILLARY
Glucose-Capillary: 118 mg/dL — ABNORMAL HIGH (ref 70–99)
Glucose-Capillary: 132 mg/dL — ABNORMAL HIGH (ref 70–99)
Glucose-Capillary: 136 mg/dL — ABNORMAL HIGH (ref 70–99)
Glucose-Capillary: 170 mg/dL — ABNORMAL HIGH (ref 70–99)

## 2018-04-03 LAB — BASIC METABOLIC PANEL
Anion gap: 5 (ref 5–15)
BUN: 26 mg/dL — AB (ref 8–23)
CO2: 26 mmol/L (ref 22–32)
Calcium: 8.8 mg/dL — ABNORMAL LOW (ref 8.9–10.3)
Chloride: 109 mmol/L (ref 98–111)
Creatinine, Ser: 1 mg/dL (ref 0.44–1.00)
GFR calc Af Amer: 57 mL/min — ABNORMAL LOW (ref >60)
GFR calc non Af Amer: 49 mL/min — ABNORMAL LOW (ref >60)
Glucose, Bld: 77 mg/dL (ref 70–99)
Potassium: 3.9 mmol/L (ref 3.5–5.1)
SODIUM: 140 mmol/L (ref 135–145)

## 2018-04-03 LAB — HEMOGLOBIN AND HEMATOCRIT, BLOOD
HCT: 27 % — ABNORMAL LOW (ref 36.0–46.0)
HEMOGLOBIN: 8.2 g/dL — AB (ref 12.0–15.0)

## 2018-04-03 MED ORDER — PSYLLIUM 95 % PO PACK
1.0000 | PACK | Freq: Every day | ORAL | Status: DC
  Administered 2018-04-03 – 2018-04-04 (×2): 1 via ORAL
  Filled 2018-04-03 (×2): qty 1

## 2018-04-03 MED ORDER — SODIUM CHLORIDE 0.9 % IV BOLUS
500.0000 mL | Freq: Once | INTRAVENOUS | Status: AC
  Administered 2018-04-03: 500 mL via INTRAVENOUS

## 2018-04-03 MED ORDER — POLYETHYLENE GLYCOL 3350 17 G PO PACK
17.0000 g | PACK | Freq: Every day | ORAL | Status: DC
  Administered 2018-04-03 – 2018-04-04 (×2): 17 g via ORAL
  Filled 2018-04-03 (×2): qty 1

## 2018-04-03 MED ORDER — HYDROCORTISONE ACETATE 25 MG RE SUPP
25.0000 mg | Freq: Two times a day (BID) | RECTAL | Status: DC
  Administered 2018-04-03 – 2018-04-04 (×3): 25 mg via RECTAL
  Filled 2018-04-03 (×4): qty 1

## 2018-04-03 NOTE — Progress Notes (Addendum)
[place] at [place] NAME: Carrie Pratt    MR#:  [id number]  DATE OF BIRTH:  08-Oct-1924  SUBJECTIVE: Admitted for symptomatic anemia secondary to bleeding internal hemorrhoids, received 1 unit of packed RBC transfusion, hemoglobin improved to 8 from 6.  However blood pressure is low this morning, she denies any dizziness.  BP meds on hold.  CHIEF COMPLAINT:   Chief Complaint  Patient presents with  . Abnormal Lab    REVIEW OF SYSTEMS:   ROS CONSTITUTIONAL: No fever, fatigue or weakness.  EYES: No blurred or double vision.  EARS, NOSE, AND THROAT: No tinnitus or ear pain.  RESPIRATORY: No cough, shortness of breath, wheezing or hemoptysis.  CARDIOVASCULAR: No chest pain, orthopnea, edema.  GASTROINTESTINAL: No nausea, vomiting, diarrhea or abdominal pain.  GENITOURINARY: No dysuria, hematuria.  ENDOCRINE: No polyuria, nocturia,  HEMATOLOGY: No anemia, easy bruising or bleeding SKIN: No rash or lesion. MUSCULOSKELETAL: No joint pain or arthritis.   NEUROLOGIC: No tingling, numbness, weakness.  PSYCHIATRY: No anxiety or depression.   DRUG ALLERGIES:   Allergies  Allergen Reactions  . Erythromycin Diarrhea    VITALS:  Blood pressure (!) 114/54, pulse 96, temperature 98.4 F (36.9 C), temperature source Oral, resp. rate 18, height 5\' 2"  (1.575 m), weight 56.7 kg, SpO2 98 %.  PHYSICAL EXAMINATION:  GENERAL:  85 y.o.-year-old patient lying in the bed with no acute distress.  EYES: Pupils equal, round, reactive to light and accommodation. No scleral icterus. Extraocular muscles intact.  HEENT: Head atraumatic, normocephalic. Oropharynx and nasopharynx clear.  NECK:  Supple, no jugular venous distention. No thyroid enlargement, no tenderness.  LUNGS: Normal breath sounds bilaterally, no wheezing, rales,rhonchi or crepitation. No use of accessory muscles of respiration.  CARDIOVASCULAR: S1, S2 normal. No  murmurs, rubs, or gallops.  ABDOMEN: Soft, nontender, nondistended. Bowel sounds present. No organomegaly or mass.  EXTREMITIES: No pedal edema, cyanosis, or clubbing.  NEUROLOGIC: Cranial nerves II through XII are intact. Muscle strength 5/5 in all extremities. Sensation intact. Gait not checked.  PSYCHIATRIC: The patient is alert and oriented x 3.  SKIN: No obvious rash, lesion, or ulcer.    LABORATORY PANEL:   CBC Recent Labs  Lab 04/03/18 0339  WBC 6.9  HGB 8.4*  HCT 27.4*  PLT 377   ------------------------------------------------------------------------------------------------------------------  Chemistries  Recent Labs  Lab 04/02/18 1427 04/03/18 0339  NA 136 140  K 4.0 3.9  CL 102 109  CO2 28 26  GLUCOSE 126* 77  BUN 28* 26*  CREATININE 1.13* 1.00  CALCIUM 8.9 8.8*  AST 20  --   ALT 11  --   ALKPHOS 74  --   BILITOT 0.4  --    ------------------------------------------------------------------------------------------------------------------  Cardiac Enzymes No results for input(s): TROPONINI in the last 168 hours. ------------------------------------------------------------------------------------------------------------------  RADIOLOGY:  No results found.  EKG:   Orders placed or performed during the hospital encounter of 06/17/17  . ED EKG within 10 minutes  . ED EKG within 10 minutes    ASSESSMENT AND PLAN:   1 symptomatic anemia secondary to bleeding internal hemorrhoids, status post 1 unit of packed RBC transfusion, hemoglobin improved from 6-8.  Seen by surgery Dr. Hampton Abbot, 2.  Internal hemorrhoids, patient has symptomatic anemia, treated with blood transfusion, surgery recommends avoid constipation, continue laxatives, sitz baths, will order Anusol cream as well. 3.  History of TIA, Plavix on hold because of anemia, likely will start Plavix at discharge. 4.  Essential hypertension,  hypotensive today, hold antihypertensives, will give IV  fluids ordered fluid bolus of normal saline 1 L. Likely discharge home tomorrow back to [facility].  Diabetes mellitus type 2, patient on Metformin XR 1 g daily, Amaryl 4 mg daily.  Continue them as they are chronic meds,  All the records are reviewed and case discussed with Care Management/Social Workerr. Management plans discussed with the patient, family and they are in agreement.  CODE STATUS: full  TOTAL TIME TAKING CARE OF THIS PATIENT: 35  minutes.   Possible discharge tomorrow.   Epifanio Lesches M.D on 04/03/2018 at 12:12 PM  Between 7am to 6pm - Pager - [phone]  After 6pm go to [email or web address] - password [email or web address] [facility]  Office  [phone]  CC: Primary care physician; Venia Carbon, MD   Note: This dictation was prepared with Dragon dictation along with smaller phrase technology. Any transcriptional errors that result from this process are unintentional.

## 2018-04-03 NOTE — Progress Notes (Addendum)
[facility] SURGICAL PROGRESS NOTE (cpt [phone])  Hospital Day(s): 0.   Post op day(s):  Marland Kitchen   Interval History: Patient seen and examined, no acute events or new complaints overnight. Patient reports that she has not noticed any signs of rectal bleeding. No complaints of abdominal pain, nausea, or emesis, dizziness, or lightheadedness. She feels a lot better after receiving 2 units of pRBCs yesterday. She has not had a BM. Tolerating a regular diet. Mobilizing.   Review of Systems:  Constitutional: denies fever, chills  Respiratory: denies any shortness of breath  Cardiovascular: denies chest pain or palpitations  Gastrointestinal: denies abdominal pain, N/V, or diarrhea/and bowel function as per interval history. No rectal bleeding Neurological: Denied dizziness, lightheadedness   Vital signs in last 24 hours: [min-max] current  Temp:  [98 F (36.7 C)-98.6 F (37 C)] 98.4 F (36.9 C) (01/31 0435) Pulse Rate:  [77-98] 77 (01/31 0435) Resp:  [17-23] 18 (01/31 0435) BP: (96-112)/(42-62) 99/48 (01/31 0435) SpO2:  [96 %-100 %] 98 % (01/31 0435) Weight:  [56.7 kg] 56.7 kg (01/30 1421)     Height: 5\' 2"  (157.5 cm) Weight: 56.7 kg BMI (Calculated): 22.86   Intake/Output this shift:  No intake/output data recorded.   Intake/Output last 2 shifts:  @IOLAST2SHIFTS @   Physical Exam:  Constitutional: alert, cooperative and no distress  HENT: normocephalic without obvious abnormality  Respiratory: breathing non-labored at rest  Gastrointestinal: soft, non-tender, and non-distended Genitourinary: No active rectal bleeding, hemorrhoid irritation, no drainage  Labs:  CBC Latest Ref Rng & Units 04/03/2018 04/02/2018 04/02/2018  WBC 4.0 - 10.5 K/uL 6.9 - 7.7  Hemoglobin 12.0 - 15.0 g/dL 8.4(L) 8.2(L) 6.9(L)  Hematocrit 36.0 - 46.0 % 27.4(L) 27.0(L) 23.4(L)  Platelets 150 - 400 K/uL 377 - 451(H)   CMP Latest Ref Rng & Units 04/03/2018 04/02/2018 06/17/2017  Glucose 70 - 99 mg/dL  77 126(H) 182(H)  BUN 8 - 23 mg/dL 26(H) 28(H) 31(H)  Creatinine 0.44 - 1.00 mg/dL 1.00 1.13(H) 1.42(H)  Sodium 135 - 145 mmol/L 140 136 134(L)  Potassium 3.5 - 5.1 mmol/L 3.9 4.0 3.6  Chloride 98 - 111 mmol/L 109 102 100(L)  CO2 22 - 32 mmol/L 26 28 26   Calcium 8.9 - 10.3 mg/dL 8.8(L) 8.9 8.9  Total Protein 6.5 - 8.1 g/dL - 6.6 -  Total Bilirubin 0.3 - 1.2 mg/dL - 0.4 -  Alkaline Phos 38 - 126 U/L - 74 -  AST 15 - 41 U/L - 20 -  ALT 0 - 44 U/L - 11 -     Assessment/Plan: (ICD-10's: K64.9) 85 y.o. female with improved anemia s/p 2 units of pRBCs yesterday secondary to bleeding hemorrhoids on Plavix, complicated by pertinent comorbidities including history of diverticulosis, HLD, HTN, history of IBS, history of TIA, DM, and advanced age.   - Regular diet as tolerates  - Pain control prn, Sitz Baths  - Hold Plavix/Anticoagulation  - Continue to monitor H&H (transfuse as needed), signs of rectal bleeding  - Again educated on the importance of starting fiber/stool softeners to help limit straining to manage hemorrhoids conservatively  - No indication for surgical intervention currently.    - Medical management per primary team, appreciate their help    - Okay for discharge from general surgery standpoint with above POC, will follow along while in house  All of the above findings and recommendations were discussed with the patient, and the medical team, and all of patient's questions were answered to her expressed satisfaction.   --  Edison Simon, PA-C [facility] 04/03/2018, 8:20 AM [phone] M-F: 7am - 4pm

## 2018-04-03 NOTE — Care Management Obs Status (Signed)
[place] NOTIFICATION   Patient Details  Name: Carrie Pratt MRN: [id number] Date of Birth: 09-11-1924   Medicare Observation Status Notification Given:  Yes    Beverly Sessions, RN 04/03/2018, 3:05 PM

## 2018-04-04 DIAGNOSIS — K649 Unspecified hemorrhoids: Secondary | ICD-10-CM | POA: Diagnosis not present

## 2018-04-04 DIAGNOSIS — E119 Type 2 diabetes mellitus without complications: Secondary | ICD-10-CM | POA: Diagnosis not present

## 2018-04-04 DIAGNOSIS — I10 Essential (primary) hypertension: Secondary | ICD-10-CM | POA: Diagnosis not present

## 2018-04-04 DIAGNOSIS — D649 Anemia, unspecified: Secondary | ICD-10-CM | POA: Diagnosis not present

## 2018-04-04 LAB — GLUCOSE, CAPILLARY: Glucose-Capillary: 91 mg/dL (ref 70–99)

## 2018-04-04 MED ORDER — PSYLLIUM 95 % PO PACK: 1.0000 | PACK | Freq: Every day | ORAL | 0 refills | Status: DC

## 2018-04-04 MED ORDER — HYDROCORTISONE ACETATE 25 MG RE SUPP: 25.0000 mg | Freq: Two times a day (BID) | RECTAL | 0 refills | Status: DC

## 2018-04-04 MED ORDER — POLYETHYLENE GLYCOL 3350 17 G PO PACK: 17.0000 g | PACK | Freq: Every day | ORAL | 0 refills | Status: DC

## 2018-04-04 NOTE — Clinical Social Work Note (Signed)
Patient is from [facility], plan to return family are transporting patient.  FL2 and discharge summary faxed to [facility] [phone] also sent in the [place] hub.  CSW signing off, please consult if other social work needs arise.  [name]. [place], MSW, [place]  04/04/2018 7:00 PM

## 2018-04-04 NOTE — Discharge Summary (Signed)
Carrie Pratt, is a 85 y.o. female  DOB 1924/04/10  MRN [id number].  Admission date:  04/02/2018  Admitting Physician  Henreitta Leber, MD  Discharge Date:  04/04/2018   Primary MD  Venia Carbon, MD  Recommendations for primary care physician for things to follow:  Follow-up with PCP in 1 week   Admission Diagnosis  Symptomatic anemia [D64.9]   Discharge Diagnosis  Symptomatic anemia [D64.9]   Active Problems:   Symptomatic anemia   Bleeding internal hemorrhoids      Past Medical History:  Diagnosis Date  . Asthma    as a child  . Bell's palsy   . Diverticulosis   . Environmental and seasonal allergies   . Hyperlipidemia   . Hypertension   . IBS (irritable bowel syndrome)   . Lower extremity edema   . Macular degeneration, wet ([place])   . Migraines   . Osteoporosis   . TIA (transient ischemic attack)   . Type 2 diabetes mellitus with polyneuropathy [facility])     Past Surgical History:  Procedure Laterality Date  . CATARACT EXTRACTION W/PHACO Left 05/29/2016   Procedure: CATARACT EXTRACTION PHACO AND INTRAOCULAR LENS PLACEMENT (IOC);  Surgeon: Estill Cotta, MD;  Location: [facility] ORS;  Service: Ophthalmology;  Laterality: Left;  [place]: 01:48.8 AP% 25.2 CDE: 50.41 NOB#[id number] H  . CHOLECYSTECTOMY    . COLONOSCOPY    . OOPHORECTOMY    . TONSILLECTOMY         History of present illness and  Hospital Course:     Kindly see H&P for history of present illness and admission details, please review complete Labs, Consult reports and Test reports for all details in brief  HPI  from the history and physical done on the day of admission 85 year old female patient sent in from PCP office because of anemia, hemoglobin was 6 and admitted for symptomatic anemia.   Hospital Course  Symptomatic anemia secondary to  bleeding internal hemorrhoids, received 1 unit of blood transfusion, hemoglobin improved from 6-8.  Seen by general surgery, recommends laxatives, sitz baths.  Patient felt much better, stable for discharge, discharged back to [facility], advised the patient to continue laxatives, also give prescription for Anusol cream. 2.  Hypotension, improved with IV fluids, blood pressure is normal, resume medicines. 3.  Diabetes mellitus type 2: Patient is on diet, metformin. 4.  History of TIA, patient Plavix was held at admission due to anemia, can resume Plavix at discharge.      Discharge Condition: Stable   Follow UP  Follow-up Information    Viviana Simpler I, MD. Schedule an appointment as soon as possible for a visit in 1 week(s).   Specialties:  Internal Medicine, Pediatrics Contact information: [place] [address] [phone]             Discharge Instructions  and  Discharge Medications     Allergies as of 04/04/2018      Reactions   Erythromycin Diarrhea      Medication List    TAKE these medications   acetaminophen 325 MG tablet Commonly known as:  TYLENOL Take 650 mg by mouth every 4 (four) hours as needed.   cetirizine 10 MG tablet Commonly known as:  ZYRTEC Take 1 tablet (10 mg total) by mouth daily.   clopidogrel 75 MG tablet Commonly known as:  PLAVIX Take 75 mg by mouth daily.   glimepiride 4 MG tablet Commonly known as:  AMARYL Take 4  mg by mouth daily with breakfast.   hydrocortisone 25 MG suppository Commonly known as:  ANUSOL-HC Place 1 suppository (25 mg total) rectally 2 (two) times daily.   lisinopril-hydrochlorothiazide 20-12.5 MG tablet Commonly known as:  PRINZIDE,ZESTORETIC Take 1 tablet by mouth daily.   metFORMIN 500 MG 24 hr tablet Commonly known as:  GLUCOPHAGE-XR Take 1,000 mg by mouth daily at 6 PM. at   montelukast 10 MG tablet Commonly known as:  SINGULAIR Take 10 mg by mouth daily as needed  (allergies).   ondansetron 4 MG tablet Commonly known as:  ZOFRAN Take 4 mg by mouth 3 (three) times daily.   polyethylene glycol packet Commonly known as:  MIRALAX / GLYCOLAX Take 17 g by mouth daily.   psyllium 95 % Pack Commonly known as:  HYDROCIL/METAMUCIL Take 1 packet by mouth daily.   rosuvastatin 20 MG tablet Commonly known as:  CRESTOR Take 20 mg by mouth daily.   VENTOLIN HFA 108 (90 Base) MCG/ACT inhaler Generic drug:  albuterol USE 1-2 PUFFS AS NEEDED EVERY 4-6 HRS AS NEEDED FOR COUGH OR WHEEZE **MUST LAST 90DAYS PER MD**         Diet and Activity recommendation: See Discharge Instructions above   Consults obtained -general surgery   Major procedures and Radiology Reports - PLEASE review detailed and final reports for all details, in brief -     No results found.  Micro Results   No results found for this or any previous visit (from the past 240 hour(s)).     Today   Subjective:    [place] today has no headache,no chest abdominal pain,no new weakness tingling or numbness, feels much better wants to go home today.   Objective:   Blood pressure (!) 126/47, pulse 77, temperature 97.9 F (36.6 C), temperature source Oral, resp. rate 18, height 5\' 2"  (1.575 m), weight 56.7 kg, SpO2 97 %.   Intake/Output Summary (Last 24 hours) at 04/04/2018 0906 Last data filed at 04/04/2018 0422 Gross per 24 hour  Intake 840 ml  Output 925 ml  Net -85 ml    Exam Awake Alert, Oriented x 3, No new F.N deficits, Normal affect [place].AT,PERRAL Supple Neck,No JVD, No cervical lymphadenopathy appriciated.  Symmetrical Chest wall movement, Good air movement bilaterally, CTAB RRR,No Gallops,Rubs or new Murmurs, No Parasternal Heave +ve B.Sounds, Abd Soft, Non tender, No organomegaly appriciated, No rebound -guarding or rigidity. No Cyanosis, Clubbing or edema, No new Rash or bruise  Data Review   CBC w Diff:  Lab Results  Component Value Date   WBC 6.9  04/03/2018   HGB 8.4 (L) 04/03/2018   HCT 27.4 (L) 04/03/2018   PLT 377 04/03/2018    CMP:  Lab Results  Component Value Date   NA 140 04/03/2018   K 3.9 04/03/2018   CL 109 04/03/2018   CO2 26 04/03/2018   BUN 26 (H) 04/03/2018   CREATININE 1.00 04/03/2018   PROT 6.6 04/02/2018   ALBUMIN 4.0 04/02/2018   BILITOT 0.4 04/02/2018   ALKPHOS 74 04/02/2018   AST 20 04/02/2018   ALT 11 04/02/2018  .   Total Time in preparing paper work, data evaluation and todays exam - 73 minutes  Epifanio Lesches M.D on 04/04/2018 at 9:06 AM    Note: This dictation was prepared with Dragon dictation along with smaller phrase technology. Any transcriptional errors that result from this process are unintentional.

## 2018-04-04 NOTE — NC FL2 (Signed)
[place] LEVEL OF CARE SCREENING TOOL     IDENTIFICATION  Patient Name: Carrie Pratt Birthdate: 12/11/1924 Sex: female Admission Date (Current Location): 04/02/2018  [place] and [place] Number:  Engineering geologist and Address:  [facility], [address] [place], [place] [address]      Provider Number: [id number]  Attending Physician Name and Address:  No att. providers found  Relative Name and Phone Number:       Current Level of Care: Hospital Recommended Level of Care: Assisted Living Facility([facility]) Prior Approval Number:    Date Approved/Denied:   PASRR Number:    Discharge Plan: Domiciliary (Rest home)([facility])    Current Diagnoses: Patient Active Problem List   Diagnosis Date Noted  . Symptomatic anemia 04/02/2018  . Bleeding internal hemorrhoids   . Advance directive discussed with patient 03/05/2018  . Type 2 diabetes mellitus with polyneuropathy ([employer])   . Hypertension   . Hyperlipidemia   . Environmental and seasonal allergies   . Macular degeneration, wet ([place])   . TIA (transient ischemic attack)     Orientation RESPIRATION BLADDER Height & Weight     Self, Situation, Place  Normal Continent Weight: 125 lb (56.7 kg) Height:  5\' 2"  (157.5 cm)  BEHAVIORAL SYMPTOMS/MOOD NEUROLOGICAL BOWEL NUTRITION STATUS      Continent Diet(Regular )  AMBULATORY STATUS COMMUNICATION OF NEEDS Skin   Supervision Verbally Normal                       Personal Care Assistance Level of Assistance  Bathing, Feeding, Dressing Bathing Assistance: Limited assistance Feeding assistance: Independent Dressing Assistance: Limited assistance     Functional Limitations Info  Sight, Hearing, Speech Sight Info: Adequate Hearing Info: Adequate Speech Info: Adequate    SPECIAL CARE FACTORS FREQUENCY  PT (By licensed PT)     PT Frequency: Home Health Minimum 2x a week              Contractures Contractures  Info: Not present    Additional Factors Info  Code Status, Allergies Code Status Info: Full Code Allergies Info: ERYTHROMYCIN            Current Medications (04/04/2018):  This is the current hospital active medication list No current facility-administered medications for this encounter.    Current Outpatient Medications  Medication Sig Dispense Refill  . acetaminophen (TYLENOL) 325 MG tablet Take 650 mg by mouth every 4 (four) hours as needed.    Marland Kitchen albuterol (VENTOLIN HFA) 108 (90 Base) MCG/ACT inhaler USE 1-2 PUFFS AS NEEDED EVERY 4-6 HRS AS NEEDED FOR COUGH OR WHEEZE **MUST LAST 90DAYS PER MD**    . cetirizine (ZYRTEC) 10 MG tablet Take 1 tablet (10 mg total) by mouth daily. 1 tablet 0  . clopidogrel (PLAVIX) 75 MG tablet Take 75 mg by mouth daily.     Marland Kitchen glimepiride (AMARYL) 4 MG tablet Take 4 mg by mouth daily with breakfast.    . lisinopril-hydrochlorothiazide (PRINZIDE,ZESTORETIC) 20-12.5 MG tablet Take 1 tablet by mouth daily.     . metFORMIN (GLUCOPHAGE-XR) 500 MG 24 hr tablet Take 1,000 mg by mouth daily at 6 PM. at    . montelukast (SINGULAIR) 10 MG tablet Take 10 mg by mouth daily as needed (allergies).    . ondansetron (ZOFRAN) 4 MG tablet Take 4 mg by mouth 3 (three) times daily.    . rosuvastatin (CRESTOR) 20 MG tablet Take 20 mg by  mouth daily.     . hydrocortisone (ANUSOL-HC) 25 MG suppository Place 1 suppository (25 mg total) rectally 2 (two) times daily. 12 suppository 0  . polyethylene glycol (MIRALAX / GLYCOLAX) packet Take 17 g by mouth daily. 14 each 0  . psyllium (HYDROCIL/METAMUCIL) 95 % PACK Take 1 packet by mouth daily. 240 each 0     Discharge Medications: TAKE these medications   acetaminophen 325 MG tablet Commonly known as:  TYLENOL Take 650 mg by mouth every 4 (four) hours as needed.   cetirizine 10 MG tablet Commonly known as:  ZYRTEC Take 1 tablet (10 mg total) by mouth daily.   clopidogrel 75 MG tablet Commonly known as:  PLAVIX Take 75 mg  by mouth daily.   glimepiride 4 MG tablet Commonly known as:  AMARYL Take 4 mg by mouth daily with breakfast.   hydrocortisone 25 MG suppository Commonly known as:  ANUSOL-HC Place 1 suppository (25 mg total) rectally 2 (two) times daily.   lisinopril-hydrochlorothiazide 20-12.5 MG tablet Commonly known as:  PRINZIDE,ZESTORETIC Take 1 tablet by mouth daily.   metFORMIN 500 MG 24 hr tablet Commonly known as:  GLUCOPHAGE-XR Take 1,000 mg by mouth daily at 6 PM. at   montelukast 10 MG tablet Commonly known as:  SINGULAIR Take 10 mg by mouth daily as needed (allergies).   ondansetron 4 MG tablet Commonly known as:  ZOFRAN Take 4 mg by mouth 3 (three) times daily.   polyethylene glycol packet Commonly known as:  MIRALAX / GLYCOLAX Take 17 g by mouth daily.   psyllium 95 % Pack Commonly known as:  HYDROCIL/METAMUCIL Take 1 packet by mouth daily.   rosuvastatin 20 MG tablet Commonly known as:  CRESTOR Take 20 mg by mouth daily.   VENTOLIN HFA 108 (90 Base) MCG/ACT inhaler Generic drug:  albuterol USE 1-2 PUFFS AS NEEDED EVERY 4-6 HRS AS NEEDED FOR COUGH OR WHEEZE **MUST LAST 90DAYS PER MD**    Relevant Imaging Results:  Relevant Lab Results:   Additional Information SSN [id number]  Ross Ludwig, [place]

## 2018-04-04 NOTE — Progress Notes (Signed)
Discharge order received. Patient is alert and oriented. Vital signs stable . No signs of acute distress. Discharge instructions given. Patient verbalized understanding. No other issues noted at this time.   

## 2018-04-04 NOTE — Plan of Care (Signed)

## 2018-04-06 ENCOUNTER — Telehealth: Payer: Self-pay | Admitting: *Deleted

## 2018-04-06 NOTE — Telephone Encounter (Signed)
Left message on voicemail for patient to call back. Needs to complete TCM call when patient calls back. Needs hospital follow-up with Dr. Silvio Pate. See discharge summary.

## 2018-04-07 NOTE — Telephone Encounter (Signed)
Spoke with patient. Patient lives at [facility]. Patient was last seen by Dr. Silvio Pate on 03/05/2018 at [facility]. I did not see that before calling patient. Patient is not sure if Dr. Silvio Pate is scheduled to follow up with patient at [facility]. I told patient I would check to make sure. Routing to Dr. Silvio Pate as Juluis Rainier.  Patient states she is doing fine since getting out of the hospital and does not have any concerns or questions at this time.

## 2018-04-08 NOTE — Telephone Encounter (Signed)
Will have Rollene Fare check on her in her [facility] assisted living apartment

## 2018-04-29 ENCOUNTER — Ambulatory Visit: Payer: Medicare Other | Admitting: Internal Medicine

## 2018-04-29 VITALS — BP 88/52 | HR 76 | Temp 98.1°F | Resp 18 | Wt 126.6 lb

## 2018-04-29 DIAGNOSIS — K649 Unspecified hemorrhoids: Secondary | ICD-10-CM

## 2018-04-29 DIAGNOSIS — D649 Anemia, unspecified: Secondary | ICD-10-CM

## 2018-05-02 ENCOUNTER — Encounter: Payer: Self-pay | Admitting: Internal Medicine

## 2018-05-02 NOTE — Progress Notes (Signed)
Subjective:    Patient ID: Carrie Pratt, female    DOB: [birth year], 85 y.o.   MRN: [id number]  HPI  Asked to see resident in [address] for hospital follow up Went to ER 1/30 with [facility] Noted to have symptomatic anemia due to bleeding internal hemorrhoid Plavix held, tranfused 1 unit PRBC, hemorrhoids treated with hemorrhoid suppositories Discharged back to ALF on 2/1 Since discharge, scant amount of rectal bleeding, no dizziness, chest pain or SOB. BP has been low, antihypertensives and Plavix restarted at discharge  Review of Systems  Past Medical History:  Diagnosis Date  . Asthma    as a child  . Bell's palsy   . Diverticulosis   . Environmental and seasonal allergies   . Hyperlipidemia   . Hypertension   . IBS (irritable bowel syndrome)   . Lower extremity edema   . Macular degeneration, wet ([place])   . Migraines   . Osteoporosis   . TIA (transient ischemic attack)   . Type 2 diabetes mellitus with polyneuropathy ([facility])     Current Outpatient Medications  Medication Sig Dispense Refill  . cetirizine (ZYRTEC) 10 MG tablet Take 1 tablet (10 mg total) by mouth daily. 1 tablet 0  . clopidogrel (PLAVIX) 75 MG tablet Take 75 mg by mouth daily.     Marland Kitchen glimepiride (AMARYL) 4 MG tablet Take 4 mg by mouth daily with breakfast.    . hydrocortisone (ANUSOL-HC) 25 MG suppository Place 1 suppository (25 mg total) rectally 2 (two) times daily. 12 suppository 0  . lisinopril-hydrochlorothiazide (PRINZIDE,ZESTORETIC) 20-12.5 MG tablet Take 1 tablet by mouth daily.     . metFORMIN (GLUCOPHAGE-XR) 500 MG 24 hr tablet Take 1,000 mg by mouth daily at 6 PM. at    . montelukast (SINGULAIR) 10 MG tablet Take 10 mg by mouth daily as needed (allergies).    . polyethylene glycol (MIRALAX / GLYCOLAX) packet Take 17 g by mouth daily. 14 each 0  . acetaminophen (TYLENOL) 325 MG tablet Take 650 mg by mouth every 4 (four) hours as needed.    Marland Kitchen albuterol (VENTOLIN HFA) 108 (90 Base) MCG/ACT inhaler USE  1-2 PUFFS AS NEEDED EVERY 4-6 HRS AS NEEDED FOR COUGH OR WHEEZE **MUST LAST 90DAYS PER MD**    . ondansetron (ZOFRAN) 4 MG tablet Take 4 mg by mouth 3 (three) times daily.    . psyllium (HYDROCIL/METAMUCIL) 95 % PACK Take 1 packet by mouth daily. (Patient not taking: Reported on 05/02/2018) 240 each 0  . rosuvastatin (CRESTOR) 20 MG tablet Take 20 mg by mouth daily.      No current facility-administered medications for this visit.     Allergies  Allergen Reactions  . Erythromycin Diarrhea    History reviewed. No pertinent family history.  Social History   Socioeconomic History  . Marital status: Widowed    Spouse name: Not on file  . Number of children: 2  . Years of education: Not on file  . Highest education level: Not on file  Occupational History  . 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 Financial resource strain: Not on file  . Food insecurity:    Worry: Not on file    Inability: Not on file  . Transportation needs:    Medical: Not on file    Non-medical: Not on file  Tobacco Use  . Smoking status: Former Smoker    Last attempt to quit: 03/04/1998    Years  since quitting: 20.1  . Smokeless tobacco: Never Used  Substance and Sexual Activity  . Alcohol use: Yes    Comment: occasional  . Drug use: No  . Sexual activity: Not on file  Lifestyle  . Physical activity:    Days per week: Not on file    Minutes per session: Not on file  . Stress: Not on file  Relationships  . Social connections:    Talks on phone: Not on file    Gets together: Not on file    Attends religious service: Not on file    Active member of club or organization: Not on file    Attends meetings of clubs or organizations: Not on file    Relationship status: Not on file  . Intimate partner violence:    Fear of current or ex partner: Not on file    Emotionally abused: Not on file    Physically abused: Not on file    Forced sexual activity: Not on  file  Other Topics Concern  . Not on file  Social History Narrative   Widowed 1977   2 sons      Not sure about living will   Sons should be health care POA   Would accept resuscitation   No tube feeds if cognitively unaware     Constitutional: Denies fever, malaise, fatigue, headache or abrupt weight changes.  Respiratory: Denies difficulty breathing, shortness of breath, cough or sputum production.   Cardiovascular: Denies chest pain, chest tightness, palpitations or swelling in the hands or feet.  Gastrointestinal: Pt reports blood in stool. Denies abdominal pain, bloating, constipation, diarrhea.  Neurological: Denies dizziness, difficulty with memory, difficulty with speech or problems with balance and coordination.    No other specific complaints in a complete review of systems (except as listed in HPI above).     Objective:   Physical Exam   BP (!) 88/52   Pulse 76   Temp 98.1 F (36.7 C)   Resp 18   Wt 126 lb 9.6 oz (57.4 kg)   BMI 23.16 kg/m  Wt Readings from Last 3 Encounters:  05/02/18 126 lb 9.6 oz (57.4 kg)  04/02/18 125 lb (56.7 kg)  03/05/18 122 lb 6.4 oz (55.5 kg)    General: Appears her stated age, well developed, well nourished in NAD. Cardiovascular: Normal rate and rhythm. S1,S2 noted.  No murmur, rubs or gallops noted.  Pulmonary/Chest: Normal effort and positive vesicular breath sounds. No respiratory distress. No wheezes, rales or ronchi noted.  Abdomen: Soft and nontender. Normal bowel sounds. No distention or masses noted.  Musculoskeletal: Gait slow and steady with use of rolling walker. Neurological: Alert and oriented.    BMET    Component Value Date/Time   NA 140 04/03/2018 0339   K 3.9 04/03/2018 0339   CL 109 04/03/2018 0339   CO2 26 04/03/2018 0339   GLUCOSE 77 04/03/2018 0339   BUN 26 (H) 04/03/2018 0339   CREATININE 1.00 04/03/2018 0339   CALCIUM 8.8 (L) 04/03/2018 0339   GFRNONAA 49 (L) 04/03/2018 0339   GFRAA 57 (L)  04/03/2018 0339    Lipid Panel  No results found for: CHOL, TRIG, HDL, CHOLHDL, VLDL, LDLCALC  CBC    Component Value Date/Time   WBC 6.9 04/03/2018 0339   RBC 3.51 (L) 04/03/2018 0339   HGB 8.4 (L) 04/03/2018 0339   HCT 27.4 (L) 04/03/2018 0339   PLT 377 04/03/2018 0339   MCV 78.1 (L) 04/03/2018 0932  MCH 23.9 (L) 04/03/2018 0339   MCHC 30.7 04/03/2018 0339   RDW 17.5 (H) 04/03/2018 0339    Hgb A1C No results found for: HGBA1C         Assessment & Plan:   Hospital Follow Up for Symptomatic Anemia, Bleeding Hemorrhoids:  Clinically improving Will repeat CBC in May unless having any issues  Continue Hydrocortisone cream for hemorrhoids Hold Lisinopril HCT x 1 week Monitor BP daily If consistently > 140/90, would restart Lisinopril HCT  Will reassess as needed Webb Silversmith, NP

## 2018-05-02 NOTE — Patient Instructions (Signed)
Anemia  Anemia is a condition in which you do not have enough red blood cells or hemoglobin. Hemoglobin is a substance in red blood cells that carries oxygen. When you do not have enough red blood cells or hemoglobin (are anemic), your body cannot get enough oxygen and your organs may not work properly. As a result, you may feel very tired or have other problems. What are the causes? Common causes of anemia include:  Excessive bleeding. Anemia can be caused by excessive bleeding inside or outside the body, including bleeding from the intestine or from periods in women.  Poor nutrition.  Long-lasting (chronic) kidney, thyroid, and liver disease.  Bone marrow disorders.  Cancer and treatments for cancer.  HIV (human immunodeficiency virus) and AIDS (acquired immunodeficiency syndrome).  Treatments for HIV and AIDS.  Spleen problems.  Blood disorders.  Infections, medicines, and autoimmune disorders that destroy red blood cells. What are the signs or symptoms? Symptoms of this condition include:  Minor weakness.  Dizziness.  Headache.  Feeling heartbeats that are irregular or faster than normal (palpitations).  Shortness of breath, especially with exercise.  Paleness.  Cold sensitivity.  Indigestion.  Nausea.  Difficulty sleeping.  Difficulty concentrating. Symptoms may occur suddenly or develop slowly. If your anemia is mild, you may not have symptoms. How is this diagnosed? This condition is diagnosed based on:  Blood tests.  Your medical history.  A physical exam.  Bone marrow biopsy. Your health care provider may also check your stool (feces) for blood and may do additional testing to look for the cause of your bleeding. You may also have other tests, including:  Imaging tests, such as a CT scan or MRI.  Endoscopy.  Colonoscopy. How is this treated? Treatment for this condition depends on the cause. If you continue to lose a lot of blood, you may  need to be treated at a hospital. Treatment may include:  Taking supplements of iron, vitamin S31, or folic acid.  Taking a hormone medicine (erythropoietin) that can help to stimulate red blood cell growth.  Having a blood transfusion. This may be needed if you lose a lot of blood.  Making changes to your diet.  Having surgery to remove your spleen. Follow these instructions at home:  Take over-the-counter and prescription medicines only as told by your health care provider.  Take supplements only as told by your health care provider.  Follow any diet instructions that you were given.  Keep all follow-up visits as told by your health care provider. This is important. Contact a health care provider if:  You develop new bleeding anywhere in the body. Get help right away if:  You are very weak.  You are short of breath.  You have pain in your abdomen or chest.  You are dizzy or feel faint.  You have trouble concentrating.  You have bloody or black, tarry stools.  You vomit repeatedly or you vomit up blood. Summary  Anemia is a condition in which you do not have enough red blood cells or enough of a substance in your red blood cells that carries oxygen (hemoglobin).  Symptoms may occur suddenly or develop slowly.  If your anemia is mild, you may not have symptoms.  This condition is diagnosed with blood tests as well as a medical history and physical exam. Other tests may be needed.  Treatment for this condition depends on the cause of the anemia. This information is not intended to replace advice given to you by  your health care provider. Make sure you discuss any questions you have with your health care provider. Document Released: 03/28/2004 Document Revised: 03/22/2016 Document Reviewed: 03/22/2016 Elsevier Interactive Patient Education  2019 [employer].

## 2018-05-07 ENCOUNTER — Encounter: Payer: Self-pay | Admitting: Internal Medicine

## 2018-05-07 DIAGNOSIS — E785 Hyperlipidemia, unspecified: Secondary | ICD-10-CM | POA: Diagnosis not present

## 2018-05-07 DIAGNOSIS — I10 Essential (primary) hypertension: Secondary | ICD-10-CM | POA: Diagnosis not present

## 2018-07-01 ENCOUNTER — Ambulatory Visit: Payer: Medicare Other | Admitting: Internal Medicine

## 2018-07-01 ENCOUNTER — Encounter: Payer: Self-pay | Admitting: Internal Medicine

## 2018-07-01 DIAGNOSIS — G459 Transient cerebral ischemic attack, unspecified: Secondary | ICD-10-CM | POA: Diagnosis not present

## 2018-07-01 DIAGNOSIS — E1142 Type 2 diabetes mellitus with diabetic polyneuropathy: Secondary | ICD-10-CM | POA: Diagnosis not present

## 2018-07-01 DIAGNOSIS — E781 Pure hyperglyceridemia: Secondary | ICD-10-CM

## 2018-07-01 DIAGNOSIS — M810 Age-related osteoporosis without current pathological fracture: Secondary | ICD-10-CM | POA: Diagnosis not present

## 2018-07-01 DIAGNOSIS — I10 Essential (primary) hypertension: Secondary | ICD-10-CM

## 2018-07-01 NOTE — Patient Instructions (Signed)
Carbohydrate Counting for Diabetes Mellitus, Adult  Carbohydrate counting is a method of keeping track of how many carbohydrates you eat. Eating carbohydrates naturally increases the amount of sugar (glucose) in the blood. Counting how many carbohydrates you eat helps keep your blood glucose within normal limits, which helps you manage your diabetes (diabetes mellitus). It is important to know how many carbohydrates you can safely have in each meal. This is different for every person. A diet and nutrition specialist (registered dietitian) can help you make a meal plan and calculate how many carbohydrates you should have at each meal and snack. Carbohydrates are found in the following foods:  Grains, such as breads and cereals.  Dried beans and soy products.  Starchy vegetables, such as potatoes, peas, and corn.  Fruit and fruit juices.  Milk and yogurt.  Sweets and snack foods, such as cake, cookies, candy, chips, and soft drinks. How do I count carbohydrates? There are two ways to count carbohydrates in food. You can use either of the methods or a combination of both. Reading "Nutrition Facts" on packaged food The "Nutrition Facts" list is included on the labels of almost all packaged foods and beverages in the [place] It includes:  The serving size.  Information about nutrients in each serving, including the grams (g) of carbohydrate per serving. To use the "Nutrition Facts":  Decide how many servings you will have.  Multiply the number of servings by the number of carbohydrates per serving.  The resulting number is the total amount of carbohydrates that you will be having. Learning standard serving sizes of other foods When you eat carbohydrate foods that are not packaged or do not include "Nutrition Facts" on the label, you need to measure the servings in order to count the amount of carbohydrates:  Measure the foods that you will eat with a food scale or measuring cup, if needed.   Decide how many standard-size servings you will eat.  Multiply the number of servings by 15. Most carbohydrate-rich foods have about 15 g of carbohydrates per serving. ? For example, if you eat 8 oz (170 g) of strawberries, you will have eaten 2 servings and 30 g of carbohydrates (2 servings x 15 g = 30 g).  For foods that have more than one food mixed, such as soups and casseroles, you must count the carbohydrates in each food that is included. The following list contains standard serving sizes of common carbohydrate-rich foods. Each of these servings has about 15 g of carbohydrates:   hamburger bun or  English muffin.   oz (15 mL) syrup.   oz (14 g) jelly.  1 slice of bread.  1 six-inch tortilla.  3 oz (85 g) cooked rice or pasta.  4 oz (113 g) cooked dried beans.  4 oz (113 g) starchy vegetable, such as peas, corn, or potatoes.  4 oz (113 g) hot cereal.  4 oz (113 g) mashed potatoes or  of a large baked potato.  4 oz (113 g) canned or frozen fruit.  4 oz (120 mL) fruit juice.  4-6 crackers.  6 chicken nuggets.  6 oz (170 g) unsweetened dry cereal.  6 oz (170 g) plain fat-free yogurt or yogurt sweetened with artificial sweeteners.  8 oz (240 mL) milk.  8 oz (170 g) fresh fruit or one small piece of fruit.  24 oz (680 g) popped popcorn. Example of carbohydrate counting Sample meal  3 oz (85 g) chicken breast.  6 oz (170 g)   brown rice.  4 oz (113 g) corn.  8 oz (240 mL) milk.  8 oz (170 g) strawberries with sugar-free whipped topping. Carbohydrate calculation 1. Identify the foods that contain carbohydrates: ? Rice. ? Corn. ? Milk. ? Strawberries. 2. Calculate how many servings you have of each food: ? 2 servings rice. ? 1 serving corn. ? 1 serving milk. ? 1 serving strawberries. 3. Multiply each number of servings by 15 g: ? 2 servings rice x 15 g = 30 g. ? 1 serving corn x 15 g = 15 g. ? 1 serving milk x 15 g = 15 g. ? 1 serving  strawberries x 15 g = 15 g. 4. Add together all of the amounts to find the total grams of carbohydrates eaten: ? 30 g + 15 g + 15 g + 15 g = 75 g of carbohydrates total. Summary  Carbohydrate counting is a method of keeping track of how many carbohydrates you eat.  Eating carbohydrates naturally increases the amount of sugar (glucose) in the blood.  Counting how many carbohydrates you eat helps keep your blood glucose within normal limits, which helps you manage your diabetes.  A diet and nutrition specialist (registered dietitian) can help you make a meal plan and calculate how many carbohydrates you should have at each meal and snack. This information is not intended to replace advice given to you by your health care provider. Make sure you discuss any questions you have with your health care provider. Document Released: 02/18/2005 Document Revised: 08/28/2016 Document Reviewed: 08/02/2015 Elsevier Interactive Patient Education  2019 Elsevier Inc.  

## 2018-07-01 NOTE — Assessment & Plan Note (Signed)
Continue daily weight bearing exercise Consider adding Vit D since she is still weight bearing

## 2018-07-01 NOTE — Assessment & Plan Note (Signed)
Continue Rosuvastatin for now unless deteriorates

## 2018-07-01 NOTE — Assessment & Plan Note (Signed)
Consider stopping Glimepiride at next visit Continue Glimepiride and Metformin for now Consume a low carb diet

## 2018-07-01 NOTE — Assessment & Plan Note (Signed)
Controlled off meds  Will monitor 

## 2018-07-01 NOTE — Progress Notes (Signed)
Subjective:    Patient ID: Carrie Pratt, female    DOB: Sep 23, 1924, 85 y.o.   MRN: [id number]  HPI  Resident seen in [address] Reviewed with RN. She reports resident having some vertigo this morning, not severe.  Resident reports she intermittently has vertigo. No recent falls due to this. No nausea, no vomiting Otherwise doing well. Sleeps fine. Walks with a rollator. Independent with ADL's. Appetite is good, she denies weight loss. She has some urinary leakage, wears pads. Her bowels are okay. She denies pain, chest pain, reflux or SOB.  DM 2 with Polyneuropathy: Her last A1C was 6%, 03/2018. She is taking Metformin and Glimepiride as prescribed. She is not taking anything for neuropathic pain at this time.  HLD with History of TIA: Mostly cognitive. She is taking Rosuvastatin and Plavix as prescribed. Appreciate ALF care.  Osteoporosis: She is getting weight bearing exercise daily. She is not taking any Calcium or Vit D OTC.  HTN: Her BP has been running 104/62--140/70. She in not currently taking Lisinopril HCT.  Migraines: Currently not an issue.  Review of Systems  Past Medical History:  Diagnosis Date  . Asthma    as a child  . Bell's palsy   . Diverticulosis   . Environmental and seasonal allergies   . Hyperlipidemia   . Hypertension   . IBS (irritable bowel syndrome)   . Lower extremity edema   . Macular degeneration, wet ([place])   . Migraines   . Osteoporosis   . TIA (transient ischemic attack)   . Type 2 diabetes mellitus with polyneuropathy (HCC)     Current Outpatient Medications  Medication Sig Dispense Refill  . clopidogrel (PLAVIX) 75 MG tablet Take 75 mg by mouth daily.     Marland Kitchen glimepiride (AMARYL) 4 MG tablet Take 4 mg by mouth daily with breakfast.    . metFORMIN (GLUCOPHAGE-XR) 500 MG 24 hr tablet Take 1,000 mg by mouth daily at 6 PM. at    . polyethylene glycol (MIRALAX / GLYCOLAX) packet Take 17 g by mouth daily. 14 each 0  . acetaminophen (TYLENOL) 325  MG tablet Take 650 mg by mouth every 4 (four) hours as needed.    Marland Kitchen albuterol (VENTOLIN HFA) 108 (90 Base) MCG/ACT inhaler USE 1-2 PUFFS AS NEEDED EVERY 4-6 HRS AS NEEDED FOR COUGH OR WHEEZE **MUST LAST 90DAYS PER MD**    . cetirizine (ZYRTEC) 10 MG tablet Take 1 tablet (10 mg total) by mouth daily. (Patient not taking: Reported on 07/01/2018) 1 tablet 0  . hydrocortisone (ANUSOL-HC) 25 MG suppository Place 1 suppository (25 mg total) rectally 2 (two) times daily. (Patient not taking: Reported on 07/01/2018) 12 suppository 0  . lisinopril-hydrochlorothiazide (PRINZIDE,ZESTORETIC) 20-12.5 MG tablet Take 1 tablet by mouth daily.     . montelukast (SINGULAIR) 10 MG tablet Take 10 mg by mouth daily as needed (allergies).    . ondansetron (ZOFRAN) 4 MG tablet Take 4 mg by mouth 3 (three) times daily.    . psyllium (HYDROCIL/METAMUCIL) 95 % PACK Take 1 packet by mouth daily. (Patient not taking: Reported on 05/02/2018) 240 each 0  . rosuvastatin (CRESTOR) 20 MG tablet Take 20 mg by mouth daily.      No current facility-administered medications for this visit.     Allergies  Allergen Reactions  . Erythromycin Diarrhea    History reviewed. No pertinent family history.  Social History   Socioeconomic History  . Marital status: Widowed    Spouse name: Not  on file  . Number of children: 2  . Years of education: Not on file  . Highest education level: Not on file  Occupational History  . 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 Financial resource strain: Not on file  . Food insecurity:    Worry: Not on file    Inability: Not on file  . Transportation needs:    Medical: Not on file    Non-medical: Not on file  Tobacco Use  . Smoking status: Former Smoker    Last attempt to quit: 03/04/1998    Years since quitting: 20.3  . Smokeless tobacco: Never Used  Substance and Sexual Activity  . Alcohol use: Yes    Comment: occasional  . Drug use: No  .  Sexual activity: Not on file  Lifestyle  . Physical activity:    Days per week: Not on file    Minutes per session: Not on file  . Stress: Not on file  Relationships  . Social connections:    Talks on phone: Not on file    Gets together: Not on file    Attends religious service: Not on file    Active member of club or organization: Not on file    Attends meetings of clubs or organizations: Not on file    Relationship status: Not on file  . Intimate partner violence:    Fear of current or ex partner: Not on file    Emotionally abused: Not on file    Physically abused: Not on file    Forced sexual activity: Not on file  Other Topics Concern  . Not on file  Social History Narrative   Widowed 1977   2 sons      Not sure about living will   Sons should be health care POA   Would accept resuscitation   No tube feeds if cognitively unaware     Constitutional: Denies fever, malaise, fatigue, headache or abrupt weight changes.  HEENT: Denies eye pain, eye redness, ear pain, ringing in the ears, wax buildup, runny nose, nasal congestion, bloody nose, or sore throat. Respiratory: Denies difficulty breathing, shortness of breath, cough or sputum production.   Cardiovascular: Denies chest pain, chest tightness, palpitations or swelling in the hands or feet.  Gastrointestinal: Denies abdominal pain, bloating, constipation, diarrhea or blood in the stool.  GU: Pt reports urge incontinence. Denies  frequency, pain with urination, burning sensation, blood in urine, odor or discharge. Musculoskeletal: Denies decrease in range of motion, difficulty with gait, muscle pain or joint pain and swelling.  Skin: Denies redness, rashes, lesions or ulcercations.  Neurological: Pt reports dizziness. Denies difficulty with memory, difficulty with speech or problems with balance and coordination.  Psych: Denies anxiety, depression, SI/HI.  No other specific complaints in a complete review of systems  (except as listed in HPI above).     Objective:   Physical Exam    BP 112/65   Pulse 72   Temp 98.1 F (36.7 C)   Resp 18  Wt Readings from Last 3 Encounters:  05/02/18 126 lb 9.6 oz (57.4 kg)  04/02/18 125 lb (56.7 kg)  03/05/18 122 lb 6.4 oz (55.5 kg)    General: Appears her stated age, well developed, well nourished in NAD. Skin: Warm, dry and intact.  Cardiovascular: Normal rate and rhythm. S1,S2 noted.  No murmur, rubs or gallops noted. No JVD or BLE edema.  Pulmonary/Chest: Normal effort and positive  vesicular breath sounds. No respiratory distress. No wheezes, rales or ronchi noted.  Abdomen: Soft and nontender. Normal bowel sounds.  Neurological: Alert and oriented.   Psychiatric: Mood and affect normal. Behavior is normal. Judgment and thought content normal.    BMET    Component Value Date/Time   NA 140 04/03/2018 0339   K 3.9 04/03/2018 0339   CL 109 04/03/2018 0339   CO2 26 04/03/2018 0339   GLUCOSE 77 04/03/2018 0339   BUN 26 (H) 04/03/2018 0339   CREATININE 1.00 04/03/2018 0339   CALCIUM 8.8 (L) 04/03/2018 0339   GFRNONAA 49 (L) 04/03/2018 0339   GFRAA 57 (L) 04/03/2018 0339    Lipid Panel  No results found for: CHOL, TRIG, HDL, CHOLHDL, VLDL, LDLCALC  CBC    Component Value Date/Time   WBC 6.9 04/03/2018 0339   RBC 3.51 (L) 04/03/2018 0339   HGB 8.4 (L) 04/03/2018 0339   HCT 27.4 (L) 04/03/2018 0339   PLT 377 04/03/2018 0339   MCV 78.1 (L) 04/03/2018 0339   MCH 23.9 (L) 04/03/2018 0339   MCHC 30.7 04/03/2018 0339   RDW 17.5 (H) 04/03/2018 0339    Hgb A1C No results found for: HGBA1C        Assessment & Plan:

## 2018-07-01 NOTE — Assessment & Plan Note (Signed)
Continue Rosuvastatin and Plavix Will monitor

## 2018-07-21 DIAGNOSIS — R309 Painful micturition, unspecified: Secondary | ICD-10-CM | POA: Diagnosis not present

## 2018-07-30 DIAGNOSIS — E1142 Type 2 diabetes mellitus with diabetic polyneuropathy: Secondary | ICD-10-CM | POA: Diagnosis not present

## 2018-07-30 DIAGNOSIS — I10 Essential (primary) hypertension: Secondary | ICD-10-CM | POA: Diagnosis not present

## 2018-08-18 DIAGNOSIS — R358 Other polyuria: Secondary | ICD-10-CM | POA: Diagnosis not present

## 2018-08-28 ENCOUNTER — Encounter: Payer: Self-pay | Admitting: Internal Medicine

## 2018-08-28 DIAGNOSIS — R6883 Chills (without fever): Secondary | ICD-10-CM | POA: Diagnosis not present

## 2018-08-28 DIAGNOSIS — R3915 Urgency of urination: Secondary | ICD-10-CM | POA: Diagnosis not present

## 2018-09-10 ENCOUNTER — Ambulatory Visit: Payer: Medicare Other | Admitting: Internal Medicine

## 2018-09-10 ENCOUNTER — Encounter: Payer: Self-pay | Admitting: Internal Medicine

## 2018-09-10 DIAGNOSIS — I10 Essential (primary) hypertension: Secondary | ICD-10-CM

## 2018-09-10 DIAGNOSIS — H353230 Exudative age-related macular degeneration, bilateral, stage unspecified: Secondary | ICD-10-CM

## 2018-09-10 DIAGNOSIS — J3089 Other allergic rhinitis: Secondary | ICD-10-CM | POA: Diagnosis not present

## 2018-09-10 DIAGNOSIS — Z8673 Personal history of transient ischemic attack (TIA), and cerebral infarction without residual deficits: Secondary | ICD-10-CM | POA: Diagnosis not present

## 2018-09-10 DIAGNOSIS — E1142 Type 2 diabetes mellitus with diabetic polyneuropathy: Secondary | ICD-10-CM | POA: Diagnosis not present

## 2018-09-10 NOTE — Assessment & Plan Note (Signed)
Last A1c 6.2% Will decrease glimepiride to avoid hypoglycemia Neuropathy is better

## 2018-09-10 NOTE — Assessment & Plan Note (Signed)
Does okay with zyrtec Asthma as child---no regular wheezing or symptoms of concern

## 2018-09-10 NOTE — Assessment & Plan Note (Signed)
No change in vision Not getting shots now

## 2018-09-10 NOTE — Assessment & Plan Note (Signed)
BP Readings from Last 3 Encounters:  09/10/18 137/65  07/01/18 112/65  05/02/18 (!) 88/52   Good control

## 2018-09-10 NOTE — Progress Notes (Signed)
Subjective:    Patient ID: Carrie Pratt, female    DOB: [birth year], 85 y.o.   MRN: [id number]  HPI Visit in assisted living apartment for review of chronic health conditions Reviewed status with Luellen Pucker RN  She notes some edema in feet Mostly at the end of the day Mild pain Seem to be better in the morning "I know I should prop my feet up" No ulcers Has some support hose--but hard to get them on  Walks with 3 wheeled walker Assist with shower Usually dresses herself Uses bathroom---sometimes gets urinary urgency--but not  Ditropan may have helped--but she notes bothersome dry mouth  Knee arthritis Stiff and painful mostly getting up Tylenol helps this  Last A1c 6.2% in May No low sugar reactions Neuropathic pain in legs seems better  No focal weakness No facial droop, aphasia, dysphagia or any neurologic symptoms  No chest pain Rare wheezing (asthma as a child) Does use zyrtec----and has singulair for prn use No sig SOB No palpitations  Current Outpatient Medications on File Prior to Visit  Medication Sig Dispense Refill  . acetaminophen (TYLENOL) 325 MG tablet Take 650 mg by mouth every 4 (four) hours as needed.    . clopidogrel (PLAVIX) 75 MG tablet Take 75 mg by mouth daily.     . Cranberry-Vitamin C (AZO CRANBERRY URINARY TRACT) 250-60 MG CAPS Take 2 capsules by mouth at bedtime.    Marland Kitchen glimepiride (AMARYL) 4 MG tablet Take 4 mg by mouth daily with breakfast.    . metFORMIN (GLUCOPHAGE-XR) 500 MG 24 hr tablet Take 1,000 mg by mouth daily at 6 PM. at    . polyethylene glycol (MIRALAX / GLYCOLAX) packet Take 17 g by mouth daily. 14 each 0  . psyllium (METAMUCIL) 58.6 % powder Take 1 packet by mouth daily.    . rosuvastatin (CRESTOR) 20 MG tablet Take 20 mg by mouth daily.      No current facility-administered medications on file prior to visit.     Allergies  Allergen Reactions  . Erythromycin Diarrhea    Past Medical History:  Diagnosis Date  . Asthma    as a child  . Bell's palsy   . Diverticulosis   . Environmental and seasonal allergies   . Hyperlipidemia   . Hypertension   . IBS (irritable bowel syndrome)   . Lower extremity edema   . Macular degeneration, wet ([place])   . Migraines   . Osteoporosis   . TIA (transient ischemic attack)   . Type 2 diabetes mellitus with polyneuropathy [facility])     Past Surgical History:  Procedure Laterality Date  . CATARACT EXTRACTION W/PHACO Left 05/29/2016   Procedure: CATARACT EXTRACTION PHACO AND INTRAOCULAR LENS PLACEMENT (IOC);  Surgeon: Estill Cotta, MD;  Location: [facility] ORS;  Service: Ophthalmology;  Laterality: Left;  [place]: 01:48.8 AP% 25.2 CDE: 50.41 XBL#[id number]  . CHOLECYSTECTOMY    . COLONOSCOPY    . OOPHORECTOMY    . TONSILLECTOMY      History reviewed. No pertinent family history.  Social History   Socioeconomic History  . Marital status: Widowed    Spouse name: Not on file  . Number of children: 2  . Years of education: Not on file  . Highest education level: Not on file  Occupational History  . 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 Financial resource strain: Not on file  . Food insecurity    Worry: Not on file  Inability: Not on file  . Transportation needs    Medical: Not on file    Non-medical: Not on file  Tobacco Use  . Smoking status: Former Smoker    Quit date: 03/04/1998    Years since quitting: 20.5  . Smokeless tobacco: Never Used  Substance and Sexual Activity  . Alcohol use: Yes    Comment: occasional  . Drug use: No  . Sexual activity: Not on file  Lifestyle  . Physical activity    Days per week: Not on file    Minutes per session: Not on file  . Stress: Not on file  Relationships  . Social Herbalist on phone: Not on file    Gets together: Not on file    Attends religious service: Not on file    Active member of club or organization: Not on file    Attends meetings of clubs or  organizations: Not on file    Relationship status: Not on file  . Intimate partner violence    Fear of current or ex partner: Not on file    Emotionally abused: Not on file    Physically abused: Not on file    Forced sexual activity: Not on file  Other Topics Concern  . Not on file  Social History Narrative   Widowed 1977   2 sons      Not sure about living will   Sons should be health care POA   Would accept resuscitation   No tube feeds if cognitively unaware   Review of Systems No longer gets migraine headaches Not much appetite but eats Weight is stable Sleeps okay Has patch of scaling skin on back--cream helps    Objective:   Physical Exam  Constitutional: No distress.  Neck: No thyromegaly present.  Cardiovascular: Normal rate, regular rhythm and normal heart sounds. Exam reveals no gallop.  No murmur heard. Respiratory: Effort normal and breath sounds normal. No respiratory distress. She has no wheezes. She has no rales.  GI: Soft. There is no abdominal tenderness.  Musculoskeletal:     Comments: 1+ edema in feet/ankles  Lymphadenopathy:    She has no cervical adenopathy.  Skin: No rash noted.  Psychiatric: She has a normal mood and affect. Her behavior is normal.           Assessment & Plan:

## 2018-09-10 NOTE — Assessment & Plan Note (Signed)
No new symptoms Continue plavix and statin

## 2018-11-23 ENCOUNTER — Emergency Department: Payer: Medicare Other

## 2018-11-23 ENCOUNTER — Other Ambulatory Visit: Payer: Self-pay

## 2018-11-23 ENCOUNTER — Emergency Department
Admission: EM | Admit: 2018-11-23 | Discharge: 2018-11-24 | Disposition: A | Discharge status: Home / Self Care | Payer: Medicare Other | Attending: Emergency Medicine | Admitting: Emergency Medicine

## 2018-11-23 ENCOUNTER — Encounter: Payer: Self-pay | Admitting: *Deleted

## 2018-11-23 DIAGNOSIS — Z8673 Personal history of transient ischemic attack (TIA), and cerebral infarction without residual deficits: Secondary | ICD-10-CM | POA: Insufficient documentation

## 2018-11-23 DIAGNOSIS — Z87891 Personal history of nicotine dependence: Secondary | ICD-10-CM | POA: Diagnosis not present

## 2018-11-23 DIAGNOSIS — I10 Essential (primary) hypertension: Secondary | ICD-10-CM | POA: Diagnosis not present

## 2018-11-23 DIAGNOSIS — Z79899 Other long term (current) drug therapy: Secondary | ICD-10-CM | POA: Diagnosis not present

## 2018-11-23 DIAGNOSIS — R0602 Shortness of breath: Secondary | ICD-10-CM | POA: Insufficient documentation

## 2018-11-23 DIAGNOSIS — Z20828 Contact with and (suspected) exposure to other viral communicable diseases: Secondary | ICD-10-CM | POA: Insufficient documentation

## 2018-11-23 DIAGNOSIS — Z7901 Long term (current) use of anticoagulants: Secondary | ICD-10-CM | POA: Diagnosis not present

## 2018-11-23 DIAGNOSIS — R000 Tachycardia, unspecified: Secondary | ICD-10-CM | POA: Diagnosis not present

## 2018-11-23 DIAGNOSIS — E1142 Type 2 diabetes mellitus with diabetic polyneuropathy: Secondary | ICD-10-CM | POA: Diagnosis not present

## 2018-11-23 DIAGNOSIS — R0689 Other abnormalities of breathing: Secondary | ICD-10-CM | POA: Diagnosis not present

## 2018-11-23 LAB — COMPREHENSIVE METABOLIC PANEL
ALT: 14 U/L (ref 0–44)
AST: 16 U/L (ref 15–41)
Albumin: 3.6 g/dL (ref 3.5–5.0)
Alkaline Phosphatase: 119 U/L (ref 38–126)
Anion gap: 9 (ref 5–15)
BUN: 19 mg/dL (ref 8–23)
CO2: 25 mmol/L (ref 22–32)
Calcium: 8.9 mg/dL (ref 8.9–10.3)
Chloride: 107 mmol/L (ref 98–111)
Creatinine, Ser: 0.72 mg/dL (ref 0.44–1.00)
GFR calc Af Amer: >60 mL/min (ref >60)
GFR calc non Af Amer: >60 mL/min (ref >60)
Glucose, Bld: 218 mg/dL — ABNORMAL HIGH (ref 70–99)
Potassium: 3.7 mmol/L (ref 3.5–5.1)
Sodium: 141 mmol/L (ref 135–145)
Total Bilirubin: 0.7 mg/dL (ref 0.3–1.2)
Total Protein: 6.7 g/dL (ref 6.5–8.1)

## 2018-11-23 LAB — CBC WITH DIFFERENTIAL/PLATELET
Abs Immature Granulocytes: 0.01 10*3/uL (ref 0.00–0.07)
Basophils Absolute: 0.1 10*3/uL (ref 0.0–0.1)
Basophils Relative: 1 %
Eosinophils Absolute: 0.3 10*3/uL (ref 0.0–0.5)
Eosinophils Relative: 4 %
HCT: 33.9 % — ABNORMAL LOW (ref 36.0–46.0)
Hemoglobin: 10.1 g/dL — ABNORMAL LOW (ref 12.0–15.0)
Immature Granulocytes: 0 %
Lymphocytes Relative: 22 %
Lymphs Abs: 1.6 10*3/uL (ref 0.7–4.0)
MCH: 22.7 pg — ABNORMAL LOW (ref 26.0–34.0)
MCHC: 29.8 g/dL — ABNORMAL LOW (ref 30.0–36.0)
MCV: 76.2 fL — ABNORMAL LOW (ref 80.0–100.0)
Monocytes Absolute: 0.6 10*3/uL (ref 0.1–1.0)
Monocytes Relative: 8 %
Neutro Abs: 4.6 10*3/uL (ref 1.7–7.7)
Neutrophils Relative %: 65 %
Platelets: 411 10*3/uL — ABNORMAL HIGH (ref 150–400)
RBC: 4.45 MIL/uL (ref 3.87–5.11)
RDW: 18.6 % — ABNORMAL HIGH (ref 11.5–15.5)
WBC: 7.1 10*3/uL (ref 4.0–10.5)
nRBC: 0 % (ref 0.0–0.2)

## 2018-11-23 LAB — URINALYSIS, ROUTINE W REFLEX MICROSCOPIC
Bacteria, UA: NONE SEEN
Bilirubin Urine: NEGATIVE
Glucose, UA: NEGATIVE mg/dL
Hgb urine dipstick: NEGATIVE
Ketones, ur: NEGATIVE mg/dL
Nitrite: NEGATIVE
Protein, ur: NEGATIVE mg/dL
Specific Gravity, Urine: 1.008 (ref 1.005–1.030)
pH: 6 (ref 5.0–8.0)

## 2018-11-23 LAB — TROPONIN I (HIGH SENSITIVITY)
Troponin I (High Sensitivity): 6 ng/L (ref <18)
Troponin I (High Sensitivity): 9 ng/L (ref <18)

## 2018-11-23 LAB — BRAIN NATRIURETIC PEPTIDE: B Natriuretic Peptide: 46 pg/mL (ref 0.0–100.0)

## 2018-11-23 LAB — SARS CORONAVIRUS 2 BY RT PCR (HOSPITAL ORDER, PERFORMED IN ~~LOC~~ HOSPITAL LAB): SARS Coronavirus 2: NEGATIVE

## 2018-11-23 MED ORDER — IOHEXOL 350 MG/ML SOLN
75.0000 mL | Freq: Once | INTRAVENOUS | Status: AC | PRN
  Administered 2018-11-23: 75 mL via INTRAVENOUS

## 2018-11-23 MED ORDER — OXYBUTYNIN CHLORIDE ER 5 MG PO TB24
5.0000 mg | ORAL_TABLET | Freq: Once | ORAL | Status: AC
  Administered 2018-11-24: 5 mg via ORAL
  Filled 2018-11-23: qty 1

## 2018-11-23 NOTE — ED Notes (Signed)
Patient transported to CT 

## 2018-11-23 NOTE — ED Notes (Addendum)
Per Kathline Magic [place] [phone] patient has been having episodes of vertigo.  Son updated on patient. Son would like updates when available.

## 2018-11-23 NOTE — Discharge Instructions (Addendum)
Your work-up was reassuring.  You should return to the ER for fevers, shortness of breath or any other concerns

## 2018-11-23 NOTE — ED Triage Notes (Signed)
Pt began to experience dyspnea x 45 mins prior to arrival. Pt placed on O2 by EMS, experienced relief. Pt does not chronically wear O2 @ asst. Living at [facility].

## 2018-11-23 NOTE — ED Provider Notes (Signed)
[facility] Emergency Department Provider Note  ____________________________________________   First MD Initiated Contact with Patient 11/23/18 1941     (approximate)  I have reviewed the triage vital signs and the nursing notes.   HISTORY  Chief Complaint Shortness of Breath    HPI Carrie Pratt is a 85 y.o. female with diabetes who presents with sudden onset of shortness of breath.  Patient had sudden onset of shortness of breath about 1 hour ago that was moderate, constant, better when EMS put her on 2 L, worse with moving around.  Patient is from [facility] [place] living facility.  She denies history of blood clots although is tachycardic.  She has baseline edema in her legs but is not on any medications for that.    Past Medical History:  Diagnosis Date  . Asthma    as a child  . Bell's palsy   . Diverticulosis   . Environmental and seasonal allergies   . Hyperlipidemia   . Hypertension   . IBS (irritable bowel syndrome)   . Lower extremity edema   . Macular degeneration, wet ([place])   . Migraines   . Osteoporosis   . TIA (transient ischemic attack)   . Type 2 diabetes mellitus with polyneuropathy [facility])     Patient Active Problem List   Diagnosis Date Noted  . History of CVA (cerebrovascular accident) 09/10/2018  . Osteoporosis 07/01/2018  . Symptomatic anemia 04/02/2018  . Bleeding internal hemorrhoids   . Advance directive discussed with patient 03/05/2018  . Type 2 diabetes mellitus with polyneuropathy ([place])   . Hypertension   . Hyperlipidemia   . Environmental and seasonal allergies   . Macular degeneration, wet ([place])   . TIA (transient ischemic attack)     Past Surgical History:  Procedure Laterality Date  . CATARACT EXTRACTION W/PHACO Left 05/29/2016   Procedure: CATARACT EXTRACTION PHACO AND INTRAOCULAR LENS PLACEMENT (IOC);  Surgeon: Estill Cotta, MD;  Location: [facility] ORS;  Service: Ophthalmology;  Laterality: Left;  [place]:  01:48.8 AP% 25.2 CDE: 50.41 [id number] H  . CHOLECYSTECTOMY    . COLONOSCOPY    . OOPHORECTOMY    . TONSILLECTOMY      Prior to Admission medications   Medication Sig Start Date End Date Taking? Authorizing Provider  acetaminophen (TYLENOL) 325 MG tablet Take 650 mg by mouth every 4 (four) hours as needed.    [provider]  cetirizine (ZYRTEC) 10 MG tablet Take 10 mg by mouth daily.    [provider]  clopidogrel (PLAVIX) 75 MG tablet Take 75 mg by mouth daily.  03/14/15   [provider]  Cranberry-Vitamin C (AZO CRANBERRY URINARY TRACT) 250-60 MG CAPS Take 2 capsules by mouth at bedtime.    [provider]  glimepiride (AMARYL) 4 MG tablet Take 2 mg by mouth daily with breakfast.    [provider]  metFORMIN (GLUCOPHAGE-XR) 500 MG 24 hr tablet Take 1,000 mg by mouth daily at 6 PM. at    [provider]  montelukast (SINGULAIR) 10 MG tablet Take 10 mg by mouth daily as needed.    [provider]  oxybutynin (DITROPAN-XL) 5 MG 24 hr tablet Take 5 mg by mouth at bedtime.    [provider]  polyethylene glycol (MIRALAX / GLYCOLAX) packet Take 17 g by mouth daily. 04/04/18   Epifanio Lesches, MD  psyllium (METAMUCIL) 58.6 % powder Take 1 packet by mouth daily.    [provider]  rosuvastatin (CRESTOR)  20 MG tablet Take 20 mg by mouth daily.  01/04/15 04/02/18  [provider]    Allergies Erythromycin  No family history on file.  Social History Social History   Tobacco Use  . Smoking status: Former Smoker    Quit date: 03/04/1998    Years since quitting: 20.7  . Smokeless tobacco: Never Used  Substance Use Topics  . Alcohol use: Yes    Comment: occasional  . Drug use: No      Review of Systems Constitutional: No fever/chills Eyes: No visual changes. ENT: No sore throat. Cardiovascular: No chest pain Respiratory: Positive for SOB Gastrointestinal: No abdominal pain.  No nausea,  no vomiting.  No diarrhea.  No constipation. Genitourinary: Negative for dysuria. Musculoskeletal: Negative for back pain. Skin: Negative for rash. Neurological: Negative for headaches, focal weakness or numbness. All other ROS negative ____________________________________________   PHYSICAL EXAM:  VITAL SIGNS: Blood pressure (!) 147/61, pulse 95, temperature 98.2 F (36.8 C), temperature source Oral, resp. rate 17, SpO2 97 %.  Constitutional: Alert and oriented. Well appearing and in no acute distress. Eyes: Conjunctivae are normal. EOMI. Head: Atraumatic. Nose: No congestion/rhinnorhea. Mouth/Throat: Mucous membranes are moist.   Neck: No stridor. Trachea Midline. FROM Cardiovascular: tachycardiac, regular rhythm. Grossly normal heart sounds.  Good peripheral circulation. Respiratory: Clear lungs bilaterally.  On room air, no increased work of breathing Gastrointestinal: Soft and nontender. No distention. No abdominal bruits.  Musculoskeletal: No lower extremity tenderness nor edema.  No joint effusions. Neurologic:  Normal speech and language. No gross focal neurologic deficits are appreciated.  Skin:  Skin is warm, dry and intact. No rash noted. Psychiatric: Mood and affect are normal. Speech and behavior are normal. GU: Deferred   ____________________________________________   LABS (all labs ordered are listed, but only abnormal results are displayed)  Labs Reviewed  CBC WITH DIFFERENTIAL/PLATELET - Abnormal; Notable for the following components:      Result Value   Hemoglobin 10.1 (*)    HCT 33.9 (*)    MCV 76.2 (*)    MCH 22.7 (*)    MCHC 29.8 (*)    RDW 18.6 (*)    Platelets 411 (*)    All other components within normal limits  COMPREHENSIVE METABOLIC PANEL - Abnormal; Notable for the following components:   Glucose, Bld 218 (*)    All other components within normal limits  URINALYSIS, ROUTINE W REFLEX MICROSCOPIC - Abnormal; Notable for the following  components:   Color, Urine STRAW (*)    APPearance CLEAR (*)    Leukocytes,Ua TRACE (*)    All other components within normal limits  SARS CORONAVIRUS 2 ([facility])  BRAIN NATRIURETIC PEPTIDE  TROPONIN I (HIGH SENSITIVITY)  TROPONIN I (HIGH SENSITIVITY)   ____________________________________________   ED ECG REPORT I, Vanessa , the attending physician, personally viewed and interpreted this ECG.  EKG is sinus tachycardia rate of 103, no ST elevation, no T wave inversion, normal intervals ____________________________________________  RADIOLOGY  Chest xray no PNA my read.  ____________________________________________   PROCEDURES  Procedure(s) performed (including Critical Care):  Procedures   ____________________________________________   INITIAL IMPRESSION / ASSESSMENT AND PLAN / ED COURSE   Carrie Pratt was evaluated in Emergency Department on 11/23/2018 for the symptoms described in the history of present illness. She was evaluated in the context of the global COVID-19 pandemic, which necessitated consideration that the patient might be at risk for infection with the SARS-CoV-2 virus  that causes COVID-19. Institutional protocols and algorithms that pertain to the evaluation of patients at risk for COVID-19 are in a state of rapid change based on information released by regulatory bodies including the CDC and federal and state organizations. These policies and algorithms were followed during the patient's care in the ED.     Pt presents with SOB. Differential includes: PNA-will get xray to evaluation Anemia-CBC to evaluate ACS- will get trops Arrhythmia-Will get EKG and keep on monitor.  COVID- will get testing per algorithm. PE-we will get CT PE given tachycardia and immobility.  White Count is normal which is reassuring.  No evidence of anemia.  Kidney function is normal.  Initial troponin is 6.  BNP is negative.  UA  without signs of UTI  Chest xray no PNA.  Re-evalated pt and continues to do well off oxygen.  Pt endorses SOB is better.  Pt handed off pending CT PE, Trop, Covid swab and ambulation trial if workup negative with her baseline walker.  Pt feels comfortable with this plan.       ____________________________________________   FINAL CLINICAL IMPRESSION(S) / ED DIAGNOSES   Final diagnoses:  SOB (shortness of breath)     MEDICATIONS GIVEN DURING THIS VISIT:  Medications - No data to display   ED Discharge Orders    None       Note:  This document was prepared using Dragon voice recognition software and may include unintentional dictation errors.   Vanessa [place], MD 11/23/18 2134

## 2018-11-23 NOTE — ED Notes (Signed)
Pt normally uses a walker and lives in an asst. Living facility where she asks for help w/ ambulation or toileting as needed. Pt is not confused and is able to make adequate safety decisions. Pt states she normally does have difficulty w/ balance and does not usually wear pants as it is difficult for her to pull them up and down and retain a grip on assistive devices present in her home.

## 2018-11-23 NOTE — ED Notes (Signed)
Pt ambulated to bathroom with assistance.  Placed back in bed, reattached monitor.  Pt given a cup of juice.  Xray at bedside

## 2018-11-23 NOTE — ED Provider Notes (Signed)
Assumed care of this patient from Dr. Nickolas Madrid.  Briefly, 85 year old here with transient shortness of breath.  It is now completely resolved.  Her lab work is very reassuring.  She lives in independent living but is not interested in seeking additional care.  EKG nonischemic.  Plan to follow-up troponin and CT angios with discharge if negative.  Second troponin remains negative.  COVID negative.  CT angios shows no significant abnormality.  No PE.  No evidence of significant cardiovascular disease.  I reviewed her labs and records.  She is ambulatory with sats greater than 95%.  She would like to return home which given her well appearance and labs, I feel is reasonable.  I discussed my concerns about her age and living in independent living with high falls risk, but she would like to return home and is not interested in seeking observation admission.  Return precautions given.  We also discussed with the nurse at her facility, who will check on her throughout the night and attempt to arrange more frequent checks.   Duffy Bruce, MD 11/23/18 [phone]

## 2018-11-24 ENCOUNTER — Other Ambulatory Visit: Payer: Self-pay | Admitting: Internal Medicine

## 2018-11-24 DIAGNOSIS — R279 Unspecified lack of coordination: Secondary | ICD-10-CM | POA: Diagnosis not present

## 2018-11-24 DIAGNOSIS — Z743 Need for continuous supervision: Secondary | ICD-10-CM | POA: Diagnosis not present

## 2018-11-24 DIAGNOSIS — R0602 Shortness of breath: Secondary | ICD-10-CM | POA: Diagnosis not present

## 2018-11-24 NOTE — ED Notes (Signed)
Esign pad not functional. Pt verbalized understanding of safety and discharge instructions. DC instructions sent w/ EMS. Spoke to son on the phone regarding her possible need for increased level of care. Spoke w/ caregiver at facility regarding safety and increased observation tonight and for the next several days.

## 2018-12-04 ENCOUNTER — Other Ambulatory Visit: Payer: Self-pay

## 2018-12-04 ENCOUNTER — Ambulatory Visit: Payer: Medicare Other | Admitting: Internal Medicine

## 2018-12-04 VITALS — BP 110/60 | HR 58

## 2018-12-04 DIAGNOSIS — R42 Dizziness and giddiness: Secondary | ICD-10-CM | POA: Diagnosis not present

## 2018-12-04 DIAGNOSIS — R296 Repeated falls: Secondary | ICD-10-CM

## 2018-12-05 ENCOUNTER — Encounter: Payer: Self-pay | Admitting: Internal Medicine

## 2018-12-05 NOTE — Patient Instructions (Signed)

## 2018-12-05 NOTE — Progress Notes (Signed)
Subjective:    Patient ID: Carrie Pratt, female    DOB: 1924-05-30, 85 y.o.   MRN: [id number]  HPI  Asked to see resident in [address] Has been having vertigo (chronic issue) She has had multiple falls with no injury. She has had success with Epley maneuver and Meclinzine in the past She denies visual changes, extreme weakness  Review of Systems  Past Medical History:  Diagnosis Date  . Asthma    as a child  . Bell's palsy   . Diverticulosis   . Environmental and seasonal allergies   . Hyperlipidemia   . Hypertension   . IBS (irritable bowel syndrome)   . Lower extremity edema   . Macular degeneration, wet ([place])   . Migraines   . Osteoporosis   . TIA (transient ischemic attack)   . Type 2 diabetes mellitus with polyneuropathy ([facility])     Current Outpatient Medications  Medication Sig Dispense Refill  . acetaminophen (TYLENOL) 325 MG tablet Take 650 mg by mouth every 4 (four) hours as needed.    . cetirizine (ZYRTEC) 10 MG tablet Take 10 mg by mouth daily.    . clopidogrel (PLAVIX) 75 MG tablet Take 75 mg by mouth daily.     . Cranberry-Vitamin C (AZO CRANBERRY URINARY TRACT) 250-60 MG CAPS Take 2 capsules by mouth at bedtime.    Marland Kitchen glimepiride (AMARYL) 4 MG tablet Take 2 mg by mouth daily with breakfast.    . metFORMIN (GLUCOPHAGE-XR) 500 MG 24 hr tablet Take 1,000 mg by mouth daily at 6 PM. at    . montelukast (SINGULAIR) 10 MG tablet Take 10 mg by mouth daily as needed.    Marland Kitchen oxybutynin (DITROPAN-XL) 5 MG 24 hr tablet Take 5 mg by mouth at bedtime.    . polyethylene glycol (MIRALAX / GLYCOLAX) packet Take 17 g by mouth daily. 14 each 0  . psyllium (METAMUCIL) 58.6 % powder Take 1 packet by mouth daily.    . rosuvastatin (CRESTOR) 20 MG tablet Take 20 mg by mouth daily.      No current facility-administered medications for this visit.     Allergies  Allergen Reactions  . Erythromycin Diarrhea    History reviewed. No pertinent family history.  Social History    Socioeconomic History  . Marital status: Widowed    Spouse name: Not on file  . Number of children: 2  . Years of education: Not on file  . Highest education level: Not on file  Occupational History  . 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 Financial resource strain: Not on file  . Food insecurity    Worry: Not on file    Inability: Not on file  . Transportation needs    Medical: Not on file    Non-medical: Not on file  Tobacco Use  . Smoking status: Former Smoker    Quit date: 03/04/1998    Years since quitting: 20.7  . Smokeless tobacco: Never Used  Substance and Sexual Activity  . Alcohol use: Yes    Comment: occasional  . Drug use: No  . Sexual activity: Not on file  Lifestyle  . Physical activity    Days per week: Not on file    Minutes per session: Not on file  . Stress: Not on file  Relationships  . Social Herbalist on phone: Not on file    Gets together: Not on file  Attends religious service: Not on file    Active member of club or organization: Not on file    Attends meetings of clubs or organizations: Not on file    Relationship status: Not on file  . Intimate partner violence    Fear of current or ex partner: Not on file    Emotionally abused: Not on file    Physically abused: Not on file    Forced sexual activity: Not on file  Other Topics Concern  . Not on file  Social History Narrative   Widowed 1977   2 sons      Not sure about living will   Sons should be health care POA   Would accept resuscitation   No tube feeds if cognitively unaware     Constitutional: Denies fever, malaise, fatigue, headache or abrupt weight changes.  HEENT: Denies eye pain, eye redness, ear pain, ringing in the ears, wax buildup, runny nose, nasal congestion, bloody nose, or sore throat. Respiratory: Denies difficulty breathing, shortness of breath, cough or sputum production.   Cardiovascular: Denies chest  pain, chest tightness, palpitations or swelling in the hands or feet.  Musculoskeletal: RN reports frequent falls. Denies decrease in range of motion,  muscle pain or joint pain and swelling.  Skin: Denies redness, rashes, lesions or ulcercations.  Neurological: Pt reports dizziness. Denies dizziness, difficulty with memory, difficulty with speech.    No other specific complaints in a complete review of systems (except as listed in HPI above).     Objective:   Physical Exam   BP 110/60   Pulse (!) 58  Wt Readings from Last 3 Encounters:  09/10/18 132 lb 6.4 oz (60.1 kg)  05/02/18 126 lb 9.6 oz (57.4 kg)  04/02/18 125 lb (56.7 kg)    General: Appears her stated age, well developed, well nourished in NAD. Skin: Warm, dry and intact. No abrasions noted. HEENT: Head: normal shape and size; Eyes: sclera white, no icterus, conjunctiva pink, and EOMs intact; Cardiovascular: Normal rate and rhythm.  Pulmonary/Chest: Normal effort and positive vesicular breath sounds. No respiratory distress. No wheezes, rales or ronchi noted.  Musculoskeletal: Decreased flexion and rotation of the cervical spine. Normal extension. No bony tenderness noted over the cervical spine. Gait slow and steady with use of rolling walkers.  Neurological: Alert and oriented.  EKG:  BMET    Component Value Date/Time   NA 141 11/23/2018 1953   K 3.7 11/23/2018 1953   CL 107 11/23/2018 1953   CO2 25 11/23/2018 1953   GLUCOSE 218 (H) 11/23/2018 1953   BUN 19 11/23/2018 1953   CREATININE 0.72 11/23/2018 1953   CALCIUM 8.9 11/23/2018 1953   GFRNONAA >60 11/23/2018 1953   GFRAA >60 11/23/2018 1953    Lipid Panel  No results found for: CHOL, TRIG, HDL, CHOLHDL, VLDL, LDLCALC  CBC    Component Value Date/Time   WBC 7.1 11/23/2018 1953   RBC 4.45 11/23/2018 1953   HGB 10.1 (L) 11/23/2018 1953   HCT 33.9 (L) 11/23/2018 1953   PLT 411 (H) 11/23/2018 1953   MCV 76.2 (L) 11/23/2018 1953   MCH 22.7 (L)  11/23/2018 1953   MCHC 29.8 (L) 11/23/2018 1953   RDW 18.6 (H) 11/23/2018 1953   LYMPHSABS 1.6 11/23/2018 1953   MONOABS 0.6 11/23/2018 1953   EOSABS 0.3 11/23/2018 1953   BASOSABS 0.1 11/23/2018 1953    Hgb A1C No results found for: HGBA1C         Assessment &  Plan:   Vertigo:  Epley maneuver attempted Encouraged her to make position changes slowly RN to get orthostatics Encouraged adequate water intake Meclizine prn Consider vestibular rehab if symptoms persists  Frequent Falls:  PT/OT eval  Will reassess as needed Webb Silversmith, NP

## 2018-12-17 DIAGNOSIS — Z03818 Encounter for observation for suspected exposure to other biological agents ruled out: Secondary | ICD-10-CM | POA: Diagnosis not present

## 2018-12-31 DIAGNOSIS — Z03818 Encounter for observation for suspected exposure to other biological agents ruled out: Secondary | ICD-10-CM | POA: Diagnosis not present

## 2019-01-06 DIAGNOSIS — Z03818 Encounter for observation for suspected exposure to other biological agents ruled out: Secondary | ICD-10-CM | POA: Diagnosis not present

## 2019-01-14 DIAGNOSIS — E1142 Type 2 diabetes mellitus with diabetic polyneuropathy: Secondary | ICD-10-CM | POA: Diagnosis not present

## 2019-01-14 DIAGNOSIS — I10 Essential (primary) hypertension: Secondary | ICD-10-CM | POA: Diagnosis not present

## 2019-01-20 ENCOUNTER — Ambulatory Visit: Payer: Medicare Other | Admitting: Internal Medicine

## 2019-01-20 ENCOUNTER — Other Ambulatory Visit: Payer: Self-pay

## 2019-01-21 NOTE — Progress Notes (Signed)
Erroneous encounter

## 2019-02-03 ENCOUNTER — Ambulatory Visit: Payer: Medicare Other | Admitting: Internal Medicine

## 2019-02-03 ENCOUNTER — Other Ambulatory Visit: Payer: Self-pay

## 2019-02-03 DIAGNOSIS — N3281 Overactive bladder: Secondary | ICD-10-CM | POA: Diagnosis not present

## 2019-02-03 DIAGNOSIS — E1142 Type 2 diabetes mellitus with diabetic polyneuropathy: Secondary | ICD-10-CM

## 2019-02-03 DIAGNOSIS — G459 Transient cerebral ischemic attack, unspecified: Secondary | ICD-10-CM | POA: Diagnosis not present

## 2019-02-03 DIAGNOSIS — E781 Pure hyperglyceridemia: Secondary | ICD-10-CM

## 2019-02-03 DIAGNOSIS — M810 Age-related osteoporosis without current pathological fracture: Secondary | ICD-10-CM | POA: Diagnosis not present

## 2019-02-03 DIAGNOSIS — I10 Essential (primary) hypertension: Secondary | ICD-10-CM | POA: Diagnosis not present

## 2019-02-03 NOTE — Progress Notes (Signed)
Subjective:    Patient ID: Carrie Pratt, female    DOB: 1924-05-22, 85 y.o.   MRN: [id number]  HPI  Resident seen in [address] Reviewed with RN. No new concerns Resident reports she intermittently has vertigo. No recent falls due to this. No nausea, no vomiting. Otherwise doing well. Sleeps fine. Walks with a rollator. Independent with ADL's. Appetite is good, she denies weight loss. She has some urinary leakage, wears pads. Her bowels are okay. She denies pain, chest pain, reflux or SOB.  DM 2 with Polyneuropathy: Her last A1C was 6.2%, 07/2018. She is taking Metformin and Glimepiride as prescribed. She is not taking anything for neuropathic pain at this time.  TIA: She denies residual weakness, cognitive issues. She does have vertigo which could be related. She is not taking ASA only Plavix and Rosuvastatin.   Osteoporosis: She is getting weight bearing exercise daily. She is not taking any Calcium or Vit D OTC.  HTN: Her BP has been running 110/60--145/70. She in not currently taking any medication for this.   OAB: Controlled on Ditropan.    Review of Systems      Past Medical History:  Diagnosis Date  . Asthma    as a child  . Bell's palsy   . Diverticulosis   . Environmental and seasonal allergies   . Hyperlipidemia   . Hypertension   . IBS (irritable bowel syndrome)   . Lower extremity edema   . Macular degeneration, wet ([place])   . Migraines   . Osteoporosis   . TIA (transient ischemic attack)   . Type 2 diabetes mellitus with polyneuropathy (HCC)     Current Outpatient Medications  Medication Sig Dispense Refill  . acetaminophen (TYLENOL) 325 MG tablet Take 650 mg by mouth every 4 (four) hours as needed.    . cetirizine (ZYRTEC) 10 MG tablet Take 10 mg by mouth daily.    . clopidogrel (PLAVIX) 75 MG tablet Take 75 mg by mouth daily.     . Cranberry-Vitamin C (AZO CRANBERRY URINARY TRACT) 250-60 MG CAPS Take 2 capsules by mouth at bedtime.    Marland Kitchen glimepiride  (AMARYL) 4 MG tablet Take 2 mg by mouth daily with breakfast.    . metFORMIN (GLUCOPHAGE-XR) 500 MG 24 hr tablet Take 1,000 mg by mouth daily at 6 PM. at    . montelukast (SINGULAIR) 10 MG tablet Take 10 mg by mouth daily as needed.    Marland Kitchen oxybutynin (DITROPAN-XL) 5 MG 24 hr tablet Take 5 mg by mouth at bedtime.    . polyethylene glycol (MIRALAX / GLYCOLAX) packet Take 17 g by mouth daily. 14 each 0  . psyllium (METAMUCIL) 58.6 % powder Take 1 packet by mouth daily.    . rosuvastatin (CRESTOR) 20 MG tablet Take 20 mg by mouth daily.      No current facility-administered medications for this visit.     Allergies  Allergen Reactions  . Erythromycin Diarrhea    No family history on file.  Social History   Socioeconomic History  . Marital status: Widowed    Spouse name: Not on file  . Number of children: 2  . Years of education: Not on file  . Highest education level: Not on file  Occupational History  . 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 Financial resource strain: Not on file  . Food insecurity    Worry: Not on file    Inability: Not  on file  . Transportation needs    Medical: Not on file    Non-medical: Not on file  Tobacco Use  . Smoking status: Former Smoker    Quit date: 03/04/1998    Years since quitting: 20.9  . Smokeless tobacco: Never Used  Substance and Sexual Activity  . Alcohol use: Yes    Comment: occasional  . Drug use: No  . Sexual activity: Not on file  Lifestyle  . Physical activity    Days per week: Not on file    Minutes per session: Not on file  . Stress: Not on file  Relationships  . Social Herbalist on phone: Not on file    Gets together: Not on file    Attends religious service: Not on file    Active member of club or organization: Not on file    Attends meetings of clubs or organizations: Not on file    Relationship status: Not on file  . Intimate partner violence    Fear of  current or ex partner: Not on file    Emotionally abused: Not on file    Physically abused: Not on file    Forced sexual activity: Not on file  Other Topics Concern  . Not on file  Social History Narrative   Widowed 1977   2 sons      Not sure about living will   Sons should be health care POA   Would accept resuscitation   No tube feeds if cognitively unaware     Constitutional: Denies fever, malaise, fatigue, headache or abrupt weight changes.  HEENT: Denies eye pain, eye redness, ear pain, ringing in the ears, wax buildup, runny nose, nasal congestion, bloody nose, or sore throat. Respiratory: Denies difficulty breathing, shortness of breath, cough or sputum production.   Cardiovascular: Denies chest pain, chest tightness, palpitations or swelling in the hands or feet.  Gastrointestinal: Pt reports intermittent constipation. Denies abdominal pain, bloating, constipation, diarrhea or blood in the stool.  GU: Denies urgency, frequency, pain with urination, burning sensation, blood in urine, odor or discharge. Musculoskeletal: Pt reports some difficulty with gait, needs walker. Denies decrease in range of motion, muscle pain or joint pain and swelling.  Skin: Denies redness, rashes, lesions or ulcercations.  Neurological: Pt reports intermittent dizziness. Denies difficulty with memory, difficulty with speech or problems with balance and coordination.  Psych: Denies anxiety, depression, SI/HI.  No other specific complaints in a complete review of systems (except as listed in HPI above).  Objective:   Physical Exam  BP 118/72   Pulse 74   Temp 97.9 F (36.6 C)   Resp 19   Wt 131 lb 12.8 oz (59.8 kg)   BMI 24.11 kg/m   Wt Readings from Last 3 Encounters:  09/10/18 132 lb 6.4 oz (60.1 kg)  05/02/18 126 lb 9.6 oz (57.4 kg)  04/02/18 125 lb (56.7 kg)    General: Appears her stated age, in NAD. Skin: Warm, dry and intact. No ulcerations noted. Neck:  Neck supple, trachea  midline. No masses, lumps or thyromegaly present.  Cardiovascular: Normal rate and rhythm. S1,S2 noted.  No murmur, rubs or gallops noted. No JVD or BLE edema.  Pulmonary/Chest: Normal effort and positive vesicular breath sounds. No respiratory distress. No wheezes, rales or ronchi noted.  Abdomen: Soft and nontender. Normal bowel sounds.  Musculoskeletal: Gait slow and steady with the use of a rolling walker.  Neurological: Alert and oriented.  BMET    Component Value Date/Time   NA 141 11/23/2018 1953   K 3.7 11/23/2018 1953   CL 107 11/23/2018 1953   CO2 25 11/23/2018 1953   GLUCOSE 218 (H) 11/23/2018 1953   BUN 19 11/23/2018 1953   CREATININE 0.72 11/23/2018 1953   CALCIUM 8.9 11/23/2018 1953   GFRNONAA >60 11/23/2018 1953   GFRAA >60 11/23/2018 1953    Lipid Panel  No results found for: CHOL, TRIG, HDL, CHOLHDL, VLDL, LDLCALC  CBC    Component Value Date/Time   WBC 7.1 11/23/2018 1953   RBC 4.45 11/23/2018 1953   HGB 10.1 (L) 11/23/2018 1953   HCT 33.9 (L) 11/23/2018 1953   PLT 411 (H) 11/23/2018 1953   MCV 76.2 (L) 11/23/2018 1953   MCH 22.7 (L) 11/23/2018 1953   MCHC 29.8 (L) 11/23/2018 1953   RDW 18.6 (H) 11/23/2018 1953   LYMPHSABS 1.6 11/23/2018 1953   MONOABS 0.6 11/23/2018 1953   EOSABS 0.3 11/23/2018 1953   BASOSABS 0.1 11/23/2018 1953    Hgb A1C No results found for: HGBA1C         Assessment & Plan:    Webb Silversmith, NP This visit occurred during the SARS-CoV-2 public health emergency.  Safety protocols were in place, including screening questions prior to the visit, additional usage of staff PPE, and extensive cleaning of exam room while observing appropriate contact time as indicated for disinfecting solutions.

## 2019-02-03 NOTE — Assessment & Plan Note (Addendum)
Controlled off meds Will continue to monitor 

## 2019-02-03 NOTE — Assessment & Plan Note (Addendum)
Continue Rosuvastatin and Plavix Appreciate SNF care

## 2019-02-04 ENCOUNTER — Encounter: Payer: Self-pay | Admitting: Internal Medicine

## 2019-02-04 DIAGNOSIS — N3281 Overactive bladder: Secondary | ICD-10-CM | POA: Insufficient documentation

## 2019-02-04 NOTE — Assessment & Plan Note (Signed)
Encouraged daily weight bearing exercise 

## 2019-02-04 NOTE — Assessment & Plan Note (Signed)
Continue Ditropan Wear incontinence pads

## 2019-02-04 NOTE — Assessment & Plan Note (Signed)
Continue Rosuvastatin.  °

## 2019-02-04 NOTE — Patient Instructions (Signed)

## 2019-02-04 NOTE — Assessment & Plan Note (Signed)
Continue Metformin and Glimeperide If A1C continues to be < 7, consider stopping Glimeperide Encouraged her to consume a low carb diet Foot exam today Eye exams as needed Flu and pneumonia vaccines UTD

## 2019-03-15 DIAGNOSIS — Z23 Encounter for immunization: Secondary | ICD-10-CM | POA: Diagnosis not present

## 2019-04-12 DIAGNOSIS — Z23 Encounter for immunization: Secondary | ICD-10-CM | POA: Diagnosis not present

## 2019-04-15 ENCOUNTER — Ambulatory Visit: Payer: Medicare Other | Admitting: Internal Medicine

## 2019-04-15 ENCOUNTER — Other Ambulatory Visit: Payer: Self-pay

## 2019-04-15 ENCOUNTER — Encounter: Payer: Self-pay | Admitting: Internal Medicine

## 2019-04-15 VITALS — BP 148/72 | HR 87 | Temp 97.9°F | Resp 18 | Wt 126.2 lb

## 2019-04-15 DIAGNOSIS — I10 Essential (primary) hypertension: Secondary | ICD-10-CM

## 2019-04-15 DIAGNOSIS — I700 Atherosclerosis of aorta: Secondary | ICD-10-CM

## 2019-04-15 DIAGNOSIS — H353230 Exudative age-related macular degeneration, bilateral, stage unspecified: Secondary | ICD-10-CM | POA: Diagnosis not present

## 2019-04-15 DIAGNOSIS — E1142 Type 2 diabetes mellitus with diabetic polyneuropathy: Secondary | ICD-10-CM

## 2019-04-15 DIAGNOSIS — N3281 Overactive bladder: Secondary | ICD-10-CM

## 2019-04-15 NOTE — Assessment & Plan Note (Signed)
Control is excellent Minimal neuro findings now--no Rx needed If any hypoglycemic symptoms, would cut the glimepiride

## 2019-04-15 NOTE — Assessment & Plan Note (Signed)
Seen on CT angiogram in July Is on statin now (for this and diabetes)

## 2019-04-15 NOTE — Assessment & Plan Note (Signed)
BP Readings from Last 3 Encounters:  04/15/19 (!) 148/72  02/04/19 118/72  12/05/18 110/60   Reasonable control for age Labs in May

## 2019-04-15 NOTE — Assessment & Plan Note (Signed)
Has not required the injections for some time

## 2019-04-15 NOTE — Assessment & Plan Note (Signed)
Seems to get some symptom relief from the oxybutynin Okay to continue for now--no apparent problems with it

## 2019-04-15 NOTE — Progress Notes (Signed)
Subjective:    Patient ID: Carrie Pratt, female    DOB: 05/02/1924, 86 y.o.   MRN: [id number]  HPI Visit in assisted living apartment for review of chronic health conditions Reviewed status with Luellen Pucker RN  Doing fine Sugars 89-115 or so  No hypoglycemic reactions No foot pain --she feels the sensation is pretty normal now  No chest pain No SOB No dizziness or syncope Some edema----occasional pain. Intermittent  Still on medication for overactive bladder Occasional dry mouth--but not a problem Nocturia x 1-2 Despite urgency--no incontinence  Walks with 3 wheeled walker Help with showers and stand by with dressing Independent with bathroom  Reviewed CT scan last July Fairly benign but did have aortic atherosclerosis  Current Outpatient Medications on File Prior to Visit  Medication Sig Dispense Refill  . acetaminophen (TYLENOL) 325 MG tablet Take 650 mg by mouth every 4 (four) hours as needed.    Marland Kitchen acetaminophen (TYLENOL) 650 MG CR tablet Take 650 mg by mouth 2 (two) times daily.    . cetirizine (ZYRTEC) 10 MG tablet Take 10 mg by mouth daily.    . clopidogrel (PLAVIX) 75 MG tablet Take 75 mg by mouth daily.     . Cranberry-Vitamin C (AZO CRANBERRY URINARY TRACT) 250-60 MG CAPS Take 2 capsules by mouth at bedtime.    Marland Kitchen glimepiride (AMARYL) 4 MG tablet Take 2 mg by mouth daily with breakfast.    . metFORMIN (GLUCOPHAGE-XR) 500 MG 24 hr tablet Take 1,000 mg by mouth daily at 6 PM. at    . montelukast (SINGULAIR) 10 MG tablet Take 10 mg by mouth daily as needed.    Marland Kitchen oxybutynin (DITROPAN-XL) 5 MG 24 hr tablet Take 5 mg by mouth at bedtime.    . polyethylene glycol (MIRALAX / GLYCOLAX) packet Take 17 g by mouth daily. 14 each 0  . psyllium (METAMUCIL) 58.6 % powder Take 1 packet by mouth daily.    . rosuvastatin (CRESTOR) 20 MG tablet Take 20 mg by mouth daily.      No current facility-administered medications on file prior to visit.    Allergies  Allergen Reactions  .  Erythromycin Diarrhea    Past Medical History:  Diagnosis Date  . Asthma    as a child  . Bell's palsy   . Diverticulosis   . Environmental and seasonal allergies   . Hyperlipidemia   . Hypertension   . IBS (irritable bowel syndrome)   . Lower extremity edema   . Macular degeneration, wet ([place])   . Migraines   . Osteoporosis   . TIA (transient ischemic attack)   . Type 2 diabetes mellitus with polyneuropathy [facility])     Past Surgical History:  Procedure Laterality Date  . CATARACT EXTRACTION W/PHACO Left 05/29/2016   Procedure: CATARACT EXTRACTION PHACO AND INTRAOCULAR LENS PLACEMENT (IOC);  Surgeon: Estill Cotta, MD;  Location: [facility] ORS;  Service: Ophthalmology;  Laterality: Left;  [place]: 01:48.8 AP% 25.2 CDE: 50.41 [id number]  . CHOLECYSTECTOMY    . COLONOSCOPY    . OOPHORECTOMY    . TONSILLECTOMY      History reviewed. No pertinent family history.  Social History   Socioeconomic History  . Marital status: Widowed    Spouse name: Not on file  . Number of children: 2  . Years of education: Not on file  . Highest education level: Not on file  Occupational History  . Occupation: Teacher--private school (all levels)    Comment: Retired  Tobacco Use  .  Smoking status: Former Smoker    Quit date: 03/04/1998    Years since quitting: 21.1  . Smokeless tobacco: Never Used  Substance and Sexual Activity  . Alcohol use: Yes    Comment: occasional  . Drug use: No  . Sexual activity: Not on file  Other Topics Concern  . Not on file  Social History Narrative   Widowed 1977   2 sons      Not sure about living will   Sons should be health care POA   Would accept resuscitation   No tube feeds if cognitively unaware   Social Determinants of Health   Financial Resource Strain:   . Difficulty of Paying Living Expenses: Not on file  Food Insecurity:   . Worried About Charity fundraiser in the Last Year: Not on file  . Ran Out of Food in the Last Year: Not on  file  Transportation Needs:   . Lack of Transportation (Medical): Not on file  . Lack of Transportation (Non-Medical): Not on file  Physical Activity:   . Days of Exercise per Week: Not on file  . Minutes of Exercise per Session: Not on file  Stress:   . Feeling of Stress : Not on file  Social Connections:   . Frequency of Communication with Friends and Family: Not on file  . Frequency of Social Gatherings with Friends and Family: Not on file  . Attends Religious Services: Not on file  . Active Member of Clubs or Organizations: Not on file  . Attends Archivist Meetings: Not on file  . Marital Status: Not on file  Intimate Partner Violence:   . Fear of Current or Ex-Partner: Not on file  . Emotionally Abused: Not on file  . Physically Abused: Not on file  . Sexually Abused: Not on file   Review of Systems Appetite is "so-so" Weight is stable Sleeps okay No regular depression or anxiety Bowels move okay Vision okay---no longer on injections for the MD now    Objective:   Physical Exam  Constitutional: She appears well-developed. No distress.  Neck: No thyromegaly present.  Cardiovascular: Normal rate, regular rhythm, normal heart sounds and intact distal pulses. Exam reveals no gallop.  No murmur heard. Respiratory: Effort normal and breath sounds normal. No respiratory distress. She has no wheezes. She has no rales.  GI: Soft. There is no abdominal tenderness.  Musculoskeletal:        General: No tenderness.     Comments: Trace - 1+ pitting edema in ankles  Lymphadenopathy:    She has no cervical adenopathy.  Skin:  No foot lesions  Psychiatric: She has a normal mood and affect. Her behavior is normal.           Assessment & Plan:

## 2019-04-27 ENCOUNTER — Other Ambulatory Visit: Payer: Self-pay

## 2019-04-27 ENCOUNTER — Emergency Department: Payer: Medicare Other

## 2019-04-27 ENCOUNTER — Emergency Department
Admission: EM | Admit: 2019-04-27 | Discharge: 2019-04-27 | Payer: Medicare Other | Attending: Emergency Medicine | Admitting: Emergency Medicine

## 2019-04-27 DIAGNOSIS — Z9049 Acquired absence of other specified parts of digestive tract: Secondary | ICD-10-CM | POA: Diagnosis not present

## 2019-04-27 DIAGNOSIS — U071 COVID-19: Secondary | ICD-10-CM | POA: Diagnosis present

## 2019-04-27 DIAGNOSIS — E119 Type 2 diabetes mellitus without complications: Secondary | ICD-10-CM | POA: Insufficient documentation

## 2019-04-27 DIAGNOSIS — J45909 Unspecified asthma, uncomplicated: Secondary | ICD-10-CM | POA: Diagnosis present

## 2019-04-27 DIAGNOSIS — E118 Type 2 diabetes mellitus with unspecified complications: Secondary | ICD-10-CM | POA: Diagnosis not present

## 2019-04-27 DIAGNOSIS — Z8673 Personal history of transient ischemic attack (TIA), and cerebral infarction without residual deficits: Secondary | ICD-10-CM | POA: Insufficient documentation

## 2019-04-27 DIAGNOSIS — R000 Tachycardia, unspecified: Secondary | ICD-10-CM | POA: Diagnosis not present

## 2019-04-27 DIAGNOSIS — X58XXXA Exposure to other specified factors, initial encounter: Secondary | ICD-10-CM | POA: Diagnosis not present

## 2019-04-27 DIAGNOSIS — Z7902 Long term (current) use of antithrombotics/antiplatelets: Secondary | ICD-10-CM | POA: Diagnosis not present

## 2019-04-27 DIAGNOSIS — W010XXA Fall on same level from slipping, tripping and stumbling without subsequent striking against object, initial encounter: Secondary | ICD-10-CM | POA: Diagnosis not present

## 2019-04-27 DIAGNOSIS — M8588 Other specified disorders of bone density and structure, other site: Secondary | ICD-10-CM | POA: Diagnosis not present

## 2019-04-27 DIAGNOSIS — R42 Dizziness and giddiness: Secondary | ICD-10-CM | POA: Diagnosis not present

## 2019-04-27 DIAGNOSIS — W19XXXA Unspecified fall, initial encounter: Secondary | ICD-10-CM | POA: Diagnosis not present

## 2019-04-27 DIAGNOSIS — S2241XA Multiple fractures of ribs, right side, initial encounter for closed fracture: Secondary | ICD-10-CM

## 2019-04-27 DIAGNOSIS — Z79899 Other long term (current) drug therapy: Secondary | ICD-10-CM | POA: Diagnosis not present

## 2019-04-27 DIAGNOSIS — Z23 Encounter for immunization: Secondary | ICD-10-CM | POA: Diagnosis not present

## 2019-04-27 DIAGNOSIS — M1990 Unspecified osteoarthritis, unspecified site: Secondary | ICD-10-CM | POA: Diagnosis not present

## 2019-04-27 DIAGNOSIS — E559 Vitamin D deficiency, unspecified: Secondary | ICD-10-CM | POA: Diagnosis not present

## 2019-04-27 DIAGNOSIS — Y929 Unspecified place or not applicable: Secondary | ICD-10-CM | POA: Insufficient documentation

## 2019-04-27 DIAGNOSIS — I10 Essential (primary) hypertension: Secondary | ICD-10-CM | POA: Insufficient documentation

## 2019-04-27 DIAGNOSIS — E1165 Type 2 diabetes mellitus with hyperglycemia: Secondary | ICD-10-CM | POA: Diagnosis not present

## 2019-04-27 DIAGNOSIS — Z7984 Long term (current) use of oral hypoglycemic drugs: Secondary | ICD-10-CM | POA: Diagnosis not present

## 2019-04-27 DIAGNOSIS — S0990XA Unspecified injury of head, initial encounter: Secondary | ICD-10-CM | POA: Diagnosis not present

## 2019-04-27 DIAGNOSIS — Z9181 History of falling: Secondary | ICD-10-CM | POA: Diagnosis not present

## 2019-04-27 DIAGNOSIS — M810 Age-related osteoporosis without current pathological fracture: Secondary | ICD-10-CM | POA: Diagnosis not present

## 2019-04-27 DIAGNOSIS — S3993XA Unspecified injury of pelvis, initial encounter: Secondary | ICD-10-CM | POA: Diagnosis not present

## 2019-04-27 DIAGNOSIS — Z7982 Long term (current) use of aspirin: Secondary | ICD-10-CM | POA: Diagnosis not present

## 2019-04-27 DIAGNOSIS — Z7951 Long term (current) use of inhaled steroids: Secondary | ICD-10-CM | POA: Diagnosis not present

## 2019-04-27 DIAGNOSIS — R54 Age-related physical debility: Secondary | ICD-10-CM | POA: Diagnosis not present

## 2019-04-27 DIAGNOSIS — R109 Unspecified abdominal pain: Secondary | ICD-10-CM | POA: Diagnosis not present

## 2019-04-27 DIAGNOSIS — Z20822 Contact with and (suspected) exposure to covid-19: Secondary | ICD-10-CM | POA: Diagnosis not present

## 2019-04-27 DIAGNOSIS — Z9189 Other specified personal risk factors, not elsewhere classified: Secondary | ICD-10-CM | POA: Diagnosis not present

## 2019-04-27 DIAGNOSIS — S299XXA Unspecified injury of thorax, initial encounter: Secondary | ICD-10-CM | POA: Diagnosis present

## 2019-04-27 DIAGNOSIS — R1030 Lower abdominal pain, unspecified: Secondary | ICD-10-CM | POA: Diagnosis not present

## 2019-04-27 DIAGNOSIS — Y33XXXA Other specified events, undetermined intent, initial encounter: Secondary | ICD-10-CM | POA: Diagnosis not present

## 2019-04-27 DIAGNOSIS — R296 Repeated falls: Secondary | ICD-10-CM | POA: Diagnosis not present

## 2019-04-27 DIAGNOSIS — Y999 Unspecified external cause status: Secondary | ICD-10-CM | POA: Insufficient documentation

## 2019-04-27 DIAGNOSIS — Z791 Long term (current) use of non-steroidal anti-inflammatories (NSAID): Secondary | ICD-10-CM | POA: Diagnosis not present

## 2019-04-27 DIAGNOSIS — N390 Urinary tract infection, site not specified: Secondary | ICD-10-CM | POA: Diagnosis not present

## 2019-04-27 DIAGNOSIS — M47816 Spondylosis without myelopathy or radiculopathy, lumbar region: Secondary | ICD-10-CM | POA: Diagnosis not present

## 2019-04-27 DIAGNOSIS — J9811 Atelectasis: Secondary | ICD-10-CM | POA: Diagnosis not present

## 2019-04-27 DIAGNOSIS — G43909 Migraine, unspecified, not intractable, without status migrainosus: Secondary | ICD-10-CM | POA: Diagnosis not present

## 2019-04-27 DIAGNOSIS — R0902 Hypoxemia: Secondary | ICD-10-CM | POA: Diagnosis not present

## 2019-04-27 DIAGNOSIS — J942 Hemothorax: Secondary | ICD-10-CM | POA: Diagnosis not present

## 2019-04-27 DIAGNOSIS — Z87891 Personal history of nicotine dependence: Secondary | ICD-10-CM | POA: Diagnosis not present

## 2019-04-27 DIAGNOSIS — E785 Hyperlipidemia, unspecified: Secondary | ICD-10-CM | POA: Diagnosis present

## 2019-04-27 DIAGNOSIS — K581 Irritable bowel syndrome with constipation: Secondary | ICD-10-CM | POA: Diagnosis not present

## 2019-04-27 DIAGNOSIS — W1839XA Other fall on same level, initial encounter: Secondary | ICD-10-CM | POA: Diagnosis not present

## 2019-04-27 DIAGNOSIS — Y9301 Activity, walking, marching and hiking: Secondary | ICD-10-CM | POA: Insufficient documentation

## 2019-04-27 DIAGNOSIS — N3281 Overactive bladder: Secondary | ICD-10-CM | POA: Diagnosis not present

## 2019-04-27 DIAGNOSIS — R41841 Cognitive communication deficit: Secondary | ICD-10-CM | POA: Diagnosis not present

## 2019-04-27 DIAGNOSIS — R4182 Altered mental status, unspecified: Secondary | ICD-10-CM | POA: Diagnosis not present

## 2019-04-27 DIAGNOSIS — S199XXA Unspecified injury of neck, initial encounter: Secondary | ICD-10-CM | POA: Diagnosis not present

## 2019-04-27 DIAGNOSIS — R52 Pain, unspecified: Secondary | ICD-10-CM | POA: Diagnosis not present

## 2019-04-27 DIAGNOSIS — K5909 Other constipation: Secondary | ICD-10-CM | POA: Diagnosis not present

## 2019-04-27 DIAGNOSIS — S272XXA Traumatic hemopneumothorax, initial encounter: Secondary | ICD-10-CM | POA: Diagnosis present

## 2019-04-27 DIAGNOSIS — S3991XA Unspecified injury of abdomen, initial encounter: Secondary | ICD-10-CM | POA: Diagnosis not present

## 2019-04-27 DIAGNOSIS — J90 Pleural effusion, not elsewhere classified: Secondary | ICD-10-CM | POA: Diagnosis not present

## 2019-04-27 DIAGNOSIS — R0781 Pleurodynia: Secondary | ICD-10-CM | POA: Diagnosis not present

## 2019-04-27 DIAGNOSIS — R918 Other nonspecific abnormal finding of lung field: Secondary | ICD-10-CM | POA: Diagnosis not present

## 2019-04-27 LAB — URINALYSIS, COMPLETE (UACMP) WITH MICROSCOPIC
Bilirubin Urine: NEGATIVE
Glucose, UA: NEGATIVE mg/dL
Hgb urine dipstick: NEGATIVE
Ketones, ur: NEGATIVE mg/dL
Leukocytes,Ua: NEGATIVE
Nitrite: NEGATIVE
Protein, ur: NEGATIVE mg/dL
Specific Gravity, Urine: 1.011 (ref 1.005–1.030)
Squamous Epithelial / HPF: NONE SEEN (ref 0–5)
pH: 5 (ref 5.0–8.0)

## 2019-04-27 LAB — COMPREHENSIVE METABOLIC PANEL
ALT: 12 U/L (ref 0–44)
AST: 18 U/L (ref 15–41)
Albumin: 4.2 g/dL (ref 3.5–5.0)
Alkaline Phosphatase: 80 U/L (ref 38–126)
Anion gap: 11 (ref 5–15)
BUN: 13 mg/dL (ref 8–23)
CO2: 24 mmol/L (ref 22–32)
Calcium: 9.2 mg/dL (ref 8.9–10.3)
Chloride: 104 mmol/L (ref 98–111)
Creatinine, Ser: 0.74 mg/dL (ref 0.44–1.00)
GFR calc Af Amer: >60 mL/min (ref >60)
GFR calc non Af Amer: >60 mL/min (ref >60)
Glucose, Bld: 122 mg/dL — ABNORMAL HIGH (ref 70–99)
Potassium: 3.6 mmol/L (ref 3.5–5.1)
Sodium: 139 mmol/L (ref 135–145)
Total Bilirubin: 1 mg/dL (ref 0.3–1.2)
Total Protein: 7.2 g/dL (ref 6.5–8.1)

## 2019-04-27 LAB — CBC WITH DIFFERENTIAL/PLATELET
Abs Immature Granulocytes: 0.06 10*3/uL (ref 0.00–0.07)
Basophils Absolute: 0.1 10*3/uL (ref 0.0–0.1)
Basophils Relative: 1 %
Eosinophils Absolute: 0.2 10*3/uL (ref 0.0–0.5)
Eosinophils Relative: 4 %
HCT: 40.9 % (ref 36.0–46.0)
Hemoglobin: 13.1 g/dL (ref 12.0–15.0)
Immature Granulocytes: 1 %
Lymphocytes Relative: 19 %
Lymphs Abs: 1.2 10*3/uL (ref 0.7–4.0)
MCH: 27.5 pg (ref 26.0–34.0)
MCHC: 32 g/dL (ref 30.0–36.0)
MCV: 85.9 fL (ref 80.0–100.0)
Monocytes Absolute: 0.6 10*3/uL (ref 0.1–1.0)
Monocytes Relative: 9 %
Neutro Abs: 4.4 10*3/uL (ref 1.7–7.7)
Neutrophils Relative %: 66 %
Platelets: 186 10*3/uL (ref 150–400)
RBC: 4.76 MIL/uL (ref 3.87–5.11)
RDW: 15.2 % (ref 11.5–15.5)
WBC: 6.5 10*3/uL (ref 4.0–10.5)
nRBC: 0 % (ref 0.0–0.2)

## 2019-04-27 LAB — RESPIRATORY PANEL BY RT PCR (FLU A&B, COVID)
Influenza A by PCR: NEGATIVE
Influenza B by PCR: NEGATIVE
SARS Coronavirus 2 by RT PCR: NEGATIVE

## 2019-04-27 LAB — LIPASE, BLOOD: Lipase: 111 U/L — ABNORMAL HIGH (ref 11–51)

## 2019-04-27 MED ORDER — IOHEXOL 300 MG/ML  SOLN
100.0000 mL | Freq: Once | INTRAMUSCULAR | Status: AC | PRN
  Administered 2019-04-27: 100 mL via INTRAVENOUS

## 2019-04-27 MED ORDER — MORPHINE SULFATE (PF) 4 MG/ML IV SOLN
4.0000 mg | Freq: Once | INTRAVENOUS | Status: AC
  Administered 2019-04-27: 10:00:00 4 mg via INTRAVENOUS
  Filled 2019-04-27: qty 1

## 2019-04-27 MED ORDER — MORPHINE SULFATE (PF) 4 MG/ML IV SOLN
4.0000 mg | Freq: Once | INTRAVENOUS | Status: AC
  Administered 2019-04-27: 08:00:00 4 mg via INTRAVENOUS
  Filled 2019-04-27: qty 1

## 2019-04-27 NOTE — ED Notes (Signed)
[place]  Per  Dr Charna Archer MD

## 2019-04-27 NOTE — ED Notes (Signed)
Pt stating that she thought she already peed. Pt bladder scanned at this time. Pt dry. Pt cath at this time and attempted to drain bladder. Pt unable to stay still. Changed pt and cleaned up linens. Clean chux and brief applied to pt. Pt pulled up in bed and resting at this time. MD informed of bladder scan info

## 2019-04-27 NOTE — ED Notes (Signed)
Pt signed paper consent to be transferred to [facility]. Consent given to Secretary.

## 2019-04-27 NOTE — ED Notes (Signed)
EMS at bedside to transport pt to [facility]. Called [facility] and informed Joseph Pierini of pt on her way.

## 2019-04-27 NOTE — ED Provider Notes (Signed)
[facility] Emergency Department Provider Note   ____________________________________________   First MD Initiated Contact with Patient 04/27/19 [phone]     (approximate)  I have reviewed the triage vital signs and the nursing notes.   HISTORY  Chief Complaint Fall    HPI Carrie Pratt is a 86 y.o. female with possible history of hypertension, diabetes, and TIA who presents to the ED complaining of fall.  Patient reports that just prior to arrival she felt dizzy while walking to the bathroom and fell to the ground.  She reports striking her right flank and chest wall, is not sure whether she hit her head or lost consciousness.  She continues to state that she feels lightheaded and dizzy here in the ED along with significant pain around her right flank and posterior chest wall.  She does state that she has been able to ambulate since the fall and denies any extremity pain, headache, neck pain, or abdominal pain.  She does take Plavix on a regular basis.        Past Medical History:  Diagnosis Date  . Asthma    as a child  . Bell's palsy   . Diverticulosis   . Environmental and seasonal allergies   . Hyperlipidemia   . Hypertension   . IBS (irritable bowel syndrome)   . Lower extremity edema   . Macular degeneration, wet ([place])   . Migraines   . Osteoporosis   . TIA (transient ischemic attack)   . Type 2 diabetes mellitus with polyneuropathy [facility])     Patient Active Problem List   Diagnosis Date Noted  . Atherosclerosis of aorta ([place]) 04/15/2019  . OAB (overactive bladder) 02/04/2019  . Osteoporosis 07/01/2018  . Bleeding internal hemorrhoids   . Advance directive discussed with patient 03/05/2018  . Type 2 diabetes mellitus with polyneuropathy ([place])   . Hypertension   . Hyperlipidemia   . Environmental and seasonal allergies   . Macular degeneration, wet ([place])   . TIA (transient ischemic attack)     Past Surgical History:  Procedure  Laterality Date  . CATARACT EXTRACTION W/PHACO Left 05/29/2016   Procedure: CATARACT EXTRACTION PHACO AND INTRAOCULAR LENS PLACEMENT (IOC);  Surgeon: Estill Cotta, MD;  Location: [facility] ORS;  Service: Ophthalmology;  Laterality: Left;  [place]: 01:48.8 AP% 25.2 CDE: 50.41 [id number]  . CHOLECYSTECTOMY    . COLONOSCOPY    . OOPHORECTOMY    . TONSILLECTOMY      Prior to Admission medications   Medication Sig Start Date End Date Taking? Authorizing Provider  acetaminophen (TYLENOL) 325 MG tablet Take 650 mg by mouth every 4 (four) hours as needed.    [provider]  acetaminophen (TYLENOL) 650 MG CR tablet Take 650 mg by mouth 2 (two) times daily.    [provider]  cetirizine (ZYRTEC) 10 MG tablet Take 10 mg by mouth daily.    [provider]  clopidogrel (PLAVIX) 75 MG tablet Take 75 mg by mouth daily.  03/14/15   [provider]  Cranberry-Vitamin C (AZO CRANBERRY URINARY TRACT) 250-60 MG CAPS Take 2 capsules by mouth at bedtime.    [provider]  glimepiride (AMARYL) 4 MG tablet Take 2 mg by mouth daily with breakfast.    [provider]  metFORMIN (GLUCOPHAGE-XR) 500 MG 24 hr tablet Take 1,000 mg by mouth daily at 6 PM. at    [provider]  montelukast (SINGULAIR) 10 MG tablet Take 10 mg by mouth  daily as needed.    [provider]  oxybutynin (DITROPAN-XL) 5 MG 24 hr tablet Take 5 mg by mouth at bedtime.    [provider]  polyethylene glycol (MIRALAX / GLYCOLAX) packet Take 17 g by mouth daily. 04/04/18   Epifanio Lesches, MD  psyllium (METAMUCIL) 58.6 % powder Take 1 packet by mouth daily.    [provider]  rosuvastatin (CRESTOR) 20 MG tablet Take 20 mg by mouth daily.  01/04/15 04/02/18  [provider]    Allergies Erythromycin  No family history on file.  Social History Social History   Tobacco Use  . Smoking status: Former Smoker    Quit date: 03/04/1998    Years  since quitting: 21.1  . Smokeless tobacco: Never Used  Substance Use Topics  . Alcohol use: Yes    Comment: occasional  . Drug use: No    Review of Systems  Constitutional: No fever/chills Eyes: No visual changes. ENT: No sore throat. Cardiovascular: Positive for chest pain. Respiratory: Denies shortness of breath. Gastrointestinal: No abdominal pain.  No nausea, no vomiting.  No diarrhea.  No constipation.  Positive for flank pain. Genitourinary: Negative for dysuria. Musculoskeletal: Negative for back pain. Skin: Negative for rash. Neurological: Negative for headaches, focal weakness or numbness.  ____________________________________________   PHYSICAL EXAM:  VITAL SIGNS: ED Triage Vitals  Enc Vitals Group     BP 04/27/19 0700 (!) 172/93     Pulse --      Resp 04/27/19 0700 18     Temp 04/27/19 0700 97.7 F (36.5 C)     Temp Source 04/27/19 0700 Oral     SpO2 04/27/19 0659 96 %     Weight 04/27/19 0705 137 lb (62.1 kg)     Height 04/27/19 0713 5\' 2"  (1.575 m)     Head Circumference --      Peak Flow --      Pain Score 04/27/19 0704 7     Pain Loc --      Pain Edu? --      Excl. in [place]? --     Constitutional: Alert and oriented. Eyes: Conjunctivae are normal. Head: Atraumatic. Nose: No congestion/rhinnorhea. Mouth/Throat: Mucous membranes are moist. Neck: Normal ROM, no midline cervical spine tenderness. Cardiovascular: Normal rate, regular rhythm. Grossly normal heart sounds. Respiratory: Normal respiratory effort.  No retractions. Lungs CTAB.  Right flank and posterior chest wall tenderness to palpation with overlying abrasion. Gastrointestinal: Soft with distended lower abdomen and suprapubic tenderness to palpation. Genitourinary: deferred Musculoskeletal: No lower extremity tenderness nor edema.  Pelvis stable without tenderness.  No extremity bony tenderness. Neurologic:  Normal speech and language. No gross focal neurologic deficits are  appreciated. Skin:  Skin is warm, dry and intact. No rash noted. Psychiatric: Mood and affect are normal. Speech and behavior are normal.  ____________________________________________   LABS (all labs ordered are listed, but only abnormal results are displayed)  Labs Reviewed  URINALYSIS, COMPLETE (UACMP) WITH MICROSCOPIC - Abnormal; Notable for the following components:      Result Value   Color, Urine STRAW (*)    APPearance CLEAR (*)    Bacteria, UA RARE (*)    All other components within normal limits  COMPREHENSIVE METABOLIC PANEL - Abnormal; Notable for the following components:   Glucose, Bld 122 (*)    All other components within normal limits  LIPASE, BLOOD - Abnormal; Notable for the following components:   Lipase 111 (*)    All other  components within normal limits  RESPIRATORY PANEL BY RT PCR (FLU A&B, COVID)  CBC WITH DIFFERENTIAL/PLATELET   ____________________________________________  EKG  ED ECG REPORT I, Blake Divine, the attending physician, personally viewed and interpreted this ECG.   Date: 04/27/2019  EKG Time: 7:47  Rate: 90  Rhythm: normal sinus rhythm  Axis: Normal  Intervals:none  ST&T Change: None   PROCEDURES  Procedure(s) performed (including Critical Care):  Procedures   ____________________________________________   INITIAL IMPRESSION / ASSESSMENT AND PLAN / ED COURSE       86 year old female presents to the ED after feeling dizzy this morning and falling to the ground, striking her right flank and posterior chest wall.  She is unsure whether she hit her head or lost consciousness, but given she is anticoagulated on Plavix, we will check CT imaging of head, C-spine, chest, abdomen, and pelvis.  She does have some distention of her lower abdomen with suprapubic tenderness, would question whether she is retaining urine however she states she has been urinating without difficulty recently.  If she is unable to urinate here we will  check bladder scan determine need for Foley catheter.  Plan to evaluate dizziness with EKG, labs, and UA.  EKG and labs are unremarkable.  Imaging shows multiple right-sided rib fractures with small associated hemopneumothorax.  Patient remains stable at this time.  Case discussed with trauma service at [facility], who accepts patient for ED to ED transfer.  Patient transferred in stable condition.      ____________________________________________   FINAL CLINICAL IMPRESSION(S) / ED DIAGNOSES  Final diagnoses:  Fall, initial encounter  Closed fracture of multiple ribs of right side, initial encounter  Hemopneumothorax on right     ED Discharge Orders    None       Note:  This document was prepared using Dragon voice recognition software and may include unintentional dictation errors.   Blake Divine, MD 04/27/19 1538

## 2019-04-27 NOTE — ED Notes (Signed)
DON called from [facility] to check on pt. Spoke with pt and was given verbal consent to update DON. DON updated and states she will call son of pt at this time and update him.

## 2019-04-27 NOTE — ED Triage Notes (Signed)
BIB [facility], pt had a mechanical fall going to the bathroom. Pt c/o right flank pain. Pt unsure if she hit her head. Pt currently A&O x 4.

## 2019-04-27 NOTE — ED Notes (Signed)
XRAY  POWERSHARE  WITH  [facility] 

## 2019-05-12 DIAGNOSIS — R358 Other polyuria: Secondary | ICD-10-CM | POA: Diagnosis not present

## 2019-05-12 DIAGNOSIS — R309 Painful micturition, unspecified: Secondary | ICD-10-CM | POA: Diagnosis not present

## 2019-07-17 ENCOUNTER — Emergency Department
Admission: EM | Admit: 2019-07-17 | Discharge: 2019-07-17 | Disposition: A | Discharge status: Home / Self Care | Payer: Medicare Other | Attending: Emergency Medicine | Admitting: Emergency Medicine

## 2019-07-17 ENCOUNTER — Emergency Department: Payer: Medicare Other

## 2019-07-17 ENCOUNTER — Other Ambulatory Visit: Payer: Self-pay

## 2019-07-17 DIAGNOSIS — W01198A Fall on same level from slipping, tripping and stumbling with subsequent striking against other object, initial encounter: Secondary | ICD-10-CM | POA: Insufficient documentation

## 2019-07-17 DIAGNOSIS — R531 Weakness: Secondary | ICD-10-CM | POA: Diagnosis not present

## 2019-07-17 DIAGNOSIS — I10 Essential (primary) hypertension: Secondary | ICD-10-CM | POA: Diagnosis not present

## 2019-07-17 DIAGNOSIS — Y998 Other external cause status: Secondary | ICD-10-CM | POA: Diagnosis not present

## 2019-07-17 DIAGNOSIS — E1140 Type 2 diabetes mellitus with diabetic neuropathy, unspecified: Secondary | ICD-10-CM | POA: Diagnosis not present

## 2019-07-17 DIAGNOSIS — Z7984 Long term (current) use of oral hypoglycemic drugs: Secondary | ICD-10-CM | POA: Diagnosis not present

## 2019-07-17 DIAGNOSIS — Z8673 Personal history of transient ischemic attack (TIA), and cerebral infarction without residual deficits: Secondary | ICD-10-CM | POA: Insufficient documentation

## 2019-07-17 DIAGNOSIS — S6991XA Unspecified injury of right wrist, hand and finger(s), initial encounter: Secondary | ICD-10-CM | POA: Diagnosis not present

## 2019-07-17 DIAGNOSIS — Y92129 Unspecified place in nursing home as the place of occurrence of the external cause: Secondary | ICD-10-CM | POA: Diagnosis not present

## 2019-07-17 DIAGNOSIS — R000 Tachycardia, unspecified: Secondary | ICD-10-CM | POA: Diagnosis not present

## 2019-07-17 DIAGNOSIS — S2241XA Multiple fractures of ribs, right side, initial encounter for closed fracture: Secondary | ICD-10-CM | POA: Diagnosis not present

## 2019-07-17 DIAGNOSIS — R410 Disorientation, unspecified: Secondary | ICD-10-CM | POA: Diagnosis not present

## 2019-07-17 DIAGNOSIS — Z87891 Personal history of nicotine dependence: Secondary | ICD-10-CM | POA: Insufficient documentation

## 2019-07-17 DIAGNOSIS — S50311A Abrasion of right elbow, initial encounter: Secondary | ICD-10-CM | POA: Diagnosis not present

## 2019-07-17 DIAGNOSIS — S61210A Laceration without foreign body of right index finger without damage to nail, initial encounter: Secondary | ICD-10-CM | POA: Diagnosis not present

## 2019-07-17 DIAGNOSIS — Y9301 Activity, walking, marching and hiking: Secondary | ICD-10-CM | POA: Insufficient documentation

## 2019-07-17 DIAGNOSIS — W19XXXA Unspecified fall, initial encounter: Secondary | ICD-10-CM

## 2019-07-17 DIAGNOSIS — Z79899 Other long term (current) drug therapy: Secondary | ICD-10-CM | POA: Insufficient documentation

## 2019-07-17 DIAGNOSIS — S199XXA Unspecified injury of neck, initial encounter: Secondary | ICD-10-CM | POA: Diagnosis not present

## 2019-07-17 DIAGNOSIS — S51011A Laceration without foreign body of right elbow, initial encounter: Secondary | ICD-10-CM | POA: Diagnosis not present

## 2019-07-17 DIAGNOSIS — T07XXXA Unspecified multiple injuries, initial encounter: Secondary | ICD-10-CM

## 2019-07-17 DIAGNOSIS — S59901A Unspecified injury of right elbow, initial encounter: Secondary | ICD-10-CM | POA: Diagnosis not present

## 2019-07-17 DIAGNOSIS — S60410A Abrasion of right index finger, initial encounter: Secondary | ICD-10-CM | POA: Diagnosis not present

## 2019-07-17 DIAGNOSIS — S060X9A Concussion with loss of consciousness of unspecified duration, initial encounter: Secondary | ICD-10-CM | POA: Diagnosis not present

## 2019-07-17 LAB — COMPREHENSIVE METABOLIC PANEL
ALT: 15 U/L (ref 0–44)
AST: 21 U/L (ref 15–41)
Albumin: 4.4 g/dL (ref 3.5–5.0)
Alkaline Phosphatase: 115 U/L (ref 38–126)
Anion gap: 14 (ref 5–15)
BUN: 13 mg/dL (ref 8–23)
CO2: 23 mmol/L (ref 22–32)
Calcium: 9.3 mg/dL (ref 8.9–10.3)
Chloride: 103 mmol/L (ref 98–111)
Creatinine, Ser: 0.8 mg/dL (ref 0.44–1.00)
GFR calc Af Amer: >60 mL/min (ref >60)
GFR calc non Af Amer: >60 mL/min (ref >60)
Glucose, Bld: 159 mg/dL — ABNORMAL HIGH (ref 70–99)
Potassium: 3.5 mmol/L (ref 3.5–5.1)
Sodium: 140 mmol/L (ref 135–145)
Total Bilirubin: 0.8 mg/dL (ref 0.3–1.2)
Total Protein: 7.6 g/dL (ref 6.5–8.1)

## 2019-07-17 LAB — CBC WITH DIFFERENTIAL/PLATELET
Abs Immature Granulocytes: 0.08 10*3/uL — ABNORMAL HIGH (ref 0.00–0.07)
Basophils Absolute: 0.1 10*3/uL (ref 0.0–0.1)
Basophils Relative: 1 %
Eosinophils Absolute: 0.3 10*3/uL (ref 0.0–0.5)
Eosinophils Relative: 3 %
HCT: 43.2 % (ref 36.0–46.0)
Hemoglobin: 14.6 g/dL (ref 12.0–15.0)
Immature Granulocytes: 1 %
Lymphocytes Relative: 24 %
Lymphs Abs: 2.2 10*3/uL (ref 0.7–4.0)
MCH: 29 pg (ref 26.0–34.0)
MCHC: 33.8 g/dL (ref 30.0–36.0)
MCV: 85.9 fL (ref 80.0–100.0)
Monocytes Absolute: 1 10*3/uL (ref 0.1–1.0)
Monocytes Relative: 11 %
Neutro Abs: 5.6 10*3/uL (ref 1.7–7.7)
Neutrophils Relative %: 60 %
Platelets: 293 10*3/uL (ref 150–400)
RBC: 5.03 MIL/uL (ref 3.87–5.11)
RDW: 14 % (ref 11.5–15.5)
WBC: 9.3 10*3/uL (ref 4.0–10.5)
nRBC: 0 % (ref 0.0–0.2)

## 2019-07-17 LAB — URINALYSIS, COMPLETE (UACMP) WITH MICROSCOPIC
Bilirubin Urine: NEGATIVE
Glucose, UA: NEGATIVE mg/dL
Ketones, ur: NEGATIVE mg/dL
Leukocytes,Ua: NEGATIVE
Nitrite: NEGATIVE
Protein, ur: NEGATIVE mg/dL
Specific Gravity, Urine: 1.003 — ABNORMAL LOW (ref 1.005–1.030)
pH: 7 (ref 5.0–8.0)

## 2019-07-17 NOTE — ED Provider Notes (Signed)
[facility] Emergency Department Provider Note  ____________________________________________  Time seen: Approximately 6:38 PM  I have reviewed the triage vital signs and the nursing notes.   HISTORY  Chief Complaint Fall    HPI Carrie Pratt is a 86 y.o. female who presents the emergency department via EMS from long-term care facility where patient lives in independent living.  Reportedly tripped over her walker, fell injuring her elbow and finger.  Patient states that she feels very weak, slightly confused.  She denied any headache.  She does not believe she hit her head or lost consciousness but patient states that she cannot remember.  When asked the patient has any other complaints other than injuries to the elbow and finger on the right hand, she states that she does not have any other complaints.  Patient has had multiple falls recently.  She was recently seen in this department for a fall sustaining multiple rib fractures, hemopneumothorax and was transferred to [facility].  Patient states that she has been doing well over the past 2 months until today.  Patient states that she feels very weak, slightly confused.  Patient currently denies any headache, vision changes, chest pain, shortness of breath, abdominal pain.  Patient has very minimal pain to elbow  or hand.  Patient has a history of hypertension, macular degeneration, TIA, diabetes.        Past Medical History:  Diagnosis Date  . Asthma    as a child  . Bell's palsy   . Diverticulosis   . Environmental and seasonal allergies   . Hyperlipidemia   . Hypertension   . IBS (irritable bowel syndrome)   . Lower extremity edema   . Macular degeneration, wet ([place])   . Migraines   . Osteoporosis   . TIA (transient ischemic attack)   . Type 2 diabetes mellitus with polyneuropathy [facility])     Patient Active Problem List   Diagnosis Date Noted  . Atherosclerosis of aorta ([place]) 04/15/2019  . OAB  (overactive bladder) 02/04/2019  . Osteoporosis 07/01/2018  . Bleeding internal hemorrhoids   . Advance directive discussed with patient 03/05/2018  . Type 2 diabetes mellitus with polyneuropathy ([place])   . Hypertension   . Hyperlipidemia   . Environmental and seasonal allergies   . Macular degeneration, wet ([place])   . TIA (transient ischemic attack)     Past Surgical History:  Procedure Laterality Date  . CATARACT EXTRACTION W/PHACO Left 05/29/2016   Procedure: CATARACT EXTRACTION PHACO AND INTRAOCULAR LENS PLACEMENT (IOC);  Surgeon: Estill Cotta, MD;  Location: [facility] ORS;  Service: Ophthalmology;  Laterality: Left;  [place]: 01:48.8 AP% 25.2 CDE: 50.41 [id number]  . CHOLECYSTECTOMY    . COLONOSCOPY    . OOPHORECTOMY    . TONSILLECTOMY      Prior to Admission medications   Medication Sig Start Date End Date Taking? Authorizing Provider  acetaminophen (TYLENOL) 650 MG CR tablet Take 650 mg by mouth 2 (two) times daily.   Yes [provider]  cetirizine (ZYRTEC) 10 MG tablet Take 10 mg by mouth daily.   Yes [provider]  clopidogrel (PLAVIX) 75 MG tablet Take 75 mg by mouth daily.  03/14/15  Yes [provider]  Cranberry-Vitamin C (AZO CRANBERRY URINARY TRACT) 250-60 MG CAPS Take 2 capsules by mouth at bedtime.   Yes [provider]  glimepiride (AMARYL) 4 MG tablet Take 2 mg by mouth daily with breakfast.   Yes [provider]  hydrocortisone (PROCTOZONE-HC)  2.5 % rectal cream Place 1 application rectally at bedtime.   Yes [provider]  metFORMIN (GLUCOPHAGE-XR) 500 MG 24 hr tablet Take 1,000 mg by mouth daily at 6 PM. at   Yes [provider]  oxybutynin (DITROPAN) 5 MG tablet Take 5 mg by mouth at bedtime.   Yes [provider]  polyethylene glycol (MIRALAX / GLYCOLAX) packet Take 17 g by mouth daily. 04/04/18  Yes Epifanio Lesches, MD  psyllium (METAMUCIL) 58.6 % powder Take 1 packet by mouth daily.    Yes [provider]  rosuvastatin (CRESTOR) 20 MG tablet Take 20 mg by mouth daily.  01/04/15 07/17/19 Yes [provider]  Vitamin D, Ergocalciferol, (DRISDOL) 1.25 MG (50000 UNIT) CAPS capsule Take 50,000 Units by mouth every 7 (seven) days. Wednesday   Yes [provider]    Allergies Erythromycin  History reviewed. No pertinent family history.  Social History Social History   Tobacco Use  . Smoking status: Former Smoker    Quit date: 03/04/1998    Years since quitting: 21.3  . Smokeless tobacco: Never Used  Substance Use Topics  . Alcohol use: Yes    Comment: occasional  . Drug use: No     Review of Systems  Constitutional: No fever/chills Eyes: No visual changes. No discharge ENT: No upper respiratory complaints. Cardiovascular: no chest pain. Respiratory: no cough. No SOB. Gastrointestinal: No abdominal pain.  No nausea, no vomiting.  No diarrhea.  No constipation. Genitourinary: Negative for dysuria. No hematuria**} Musculoskeletal: Negative for musculoskeletal pain. Skin: Negative for rash, abrasions, lacerations, ecchymosis. Neurological: Negative for headaches, focal weakness or numbness. 10-point ROS otherwise negative.  ____________________________________________   PHYSICAL EXAM:  VITAL SIGNS: ED Triage Vitals  Enc Vitals Grou     BP 07/17/19 1820 (!) 177/92     Pulse Rate 07/17/19 1820 (!) 112     Resp 07/17/19 1820 (!) 22     Temp --      Temp src --      SpO2 07/17/19 1820 96 %     Weight 07/17/19 1821 140 lb (63.5 kg)     Height 07/17/19 1821 5\' 3"  (1.6 m)     Head Circumference --      Peak Flow --      Pain Score 07/17/19 1820 0     Pain Loc --      Pain Edu? --      Excl. in [place]? --      Constitutional: Alert and oriented. Well appearing and in no acute distress. Eyes: Conjunctivae are normal. PERRL. EOMI. Head: No visible signs of trauma.  Patient has a small palpable bump in the occipital region.  No  overlying abrasions, lacerations or significant ecchymosis.  Nontender in this region.  No other palpable findings about the skull or face.  No battle signs, raccoon eyes, serosanguineous fluid drainage from the ears or nares. ENT:      Ears:       Nose: No congestion/rhinnorhea.      Mouth/Throat: Mucous membranes are moist.  Neck: No stridor.  No cervical spine tenderness to palpation. Cardiovascular: Normal rate, regular rhythm. Normal S1 and S2.  Good peripheral circulation. Respiratory: Normal respiratory effort without tachypnea or retractions. Lungs CTAB. Good air entry to the bases with no decreased or absent breath sounds. Musculoskeletal: Full range of motion to all extremities. No gross deformities appreciated.  Visualization of the right elbow reveals superficial skin tear along the olecranon process region.  No visible foreign body.  No active bleeding.  Good range of motion to the elbow.  No tenderness to palpation of the osseous structures of the elbow.  Visualization of the right hand reveals a superficial skin tear to the index finger.  No deformity noted to the digit.  Full range of motion all 5 digits.  No tenderness to palpation of the osseous structures of the wrist, hand.  Sensation and capillary refill intact all digits. Neurologic:  Normal speech and language. No gross focal neurologic deficits are appreciated.  Skin:  Skin is warm, dry and intact. No rash noted. Psychiatric: Mood and affect are normal. Speech and behavior are normal. Patient exhibits appropriate insight and judgement.   ____________________________________________   LABS (all labs ordered are listed, but only abnormal results are displayed)  Labs Reviewed  URINALYSIS, COMPLETE (UACMP) WITH MICROSCOPIC - Abnormal; Notable for the following components:      Result Value   Color, Urine STRAW (*)    APPearance CLEAR (*)    Specific Gravity, Urine 1.003 (*)    Hgb urine dipstick SMALL (*)    Bacteria, UA  RARE (*)    All other components within normal limits  CBC WITH DIFFERENTIAL/PLATELET - Abnormal; Notable for the following components:   Abs Immature Granulocytes 0.08 (*)    All other components within normal limits  COMPREHENSIVE METABOLIC PANEL - Abnormal; Notable for the following components:   Glucose, Bld 159 (*)    All other components within normal limits   ____________________________________________  EKG   ____________________________________________  RADIOLOGY I personally viewed and evaluated these images as part of my medical decision making, as well as reviewing the written report by the radiologist.  DG Chest 1 View  Result Date: 07/17/2019 CLINICAL DATA:  Weakness, fall EXAM: CHEST  1 VIEW COMPARISON:  11/23/2018 FINDINGS: Stable mild cardiomegaly. Atherosclerotic calcification of the aortic knob. Chronic peribronchial thickening and biapical pleuroparenchymal scarring. No focal airspace consolidation, pleural effusion, or pneumothorax. Mildly displaced fractures involving the posterolateral right seventh in a ribs. IMPRESSION: 1. Mildly displaced fractures of the posterolateral right seventh and a right ribs. No pneumothorax. 2. Stable mild cardiomegaly. Electronically Signed   By: Davina Poke D.O.   On: 07/17/2019 19:10   DG Elbow Complete Right  Result Date: 07/17/2019 CLINICAL DATA:  Weakness and fall with rib fractures EXAM: RIGHT ELBOW - COMPLETE 3+ VIEW COMPARISON:  None. FINDINGS: There is no evidence of fracture, dislocation, or joint effusion. There is no evidence of arthropathy or other focal bone abnormality. Soft tissues are unremarkable. IMPRESSION: Negative. Electronically Signed   By: Donavan Foil M.D.   On: 07/17/2019 19:07   CT Head Wo Contrast  Result Date: 07/17/2019 CLINICAL DATA:  Fall, loss of consciousness, possible head strike EXAM: CT HEAD WITHOUT CONTRAST CT CERVICAL SPINE WITHOUT CONTRAST TECHNIQUE: Multidetector CT imaging of the head  and cervical spine was performed following the standard protocol without intravenous contrast. Multiplanar CT image reconstructions of the cervical spine were also generated. COMPARISON:  CT head and cervical spine 04/27/2019 FINDINGS: CT HEAD FINDINGS Brain: Stable regions of encephalomalacia and parenchymal calcification bilateral occipital lobes. Remote lacunar infarcts in the bilateral basal ganglia are noted as well. Senescent basal ganglia mineralization is present. No evidence of acute infarction, hemorrhage, hydrocephalus, extra-axial collection or mass lesion/mass effect. Symmetric prominence of the ventricles, cisterns and sulci compatible with parenchymal volume loss. Patchy areas of white matter hypoattenuation are most compatible with chronic microvascular angiopathy. Vascular: Extensive atherosclerotic  calcification of the carotid siphons and intradural vertebral arteries. No hyperdense vessel. Skull: Contusive changes of the left parieto-occipital scalp with small crescentic hematoma measuring up to 3 mm in maximal thickness. No subjacent calvarial fracture. No acute osseous injury or suspicious osseous lesions. Benign hyperostosis frontalis interna is noted incidentally. Sinuses/Orbits: Paranasal sinuses and mastoid air cells are predominantly clear. Insert mastoids middle ear cavities are clear. Orbital structures are unremarkable aside from prior left lens extraction. Other: Moderate bilateral TMJ arthrosis. CT CERVICAL SPINE FINDINGS Alignment: Stabilization collar absent at the time of examination. No evidence of traumatic listhesis. No abnormally widened, perched or jumped facets. Normal alignment of the craniocervical and atlantoaxial articulations. Skull base and vertebrae: No acute fracture. No primary bone lesion or focal pathologic process. Soft tissues and spinal canal: Mineralization about the dens likely reflect calcium pyrophosphate deposition. No pre or paravertebral fluid or swelling.  No visible canal hematoma. Disc levels: Multilevel intervertebral disc height loss with spondylitic endplate changes. Features most pronounced at C5-6 where a small posterior disc osteophyte complex effaces the ventral thecal sac with only mild canal stenosis. Multilevel uncinate spurring and facet hypertrophic changes are noted as well also most pronounced at C5-6 but with only mild to moderate left foraminal narrowing. Upper chest: Extensive biapical pleuroparenchymal scarring is noted. No acute abnormality in the upper chest or imaged lung apices. Other: Extensive atherosclerotic calcification of the thoracic aortic arch and proximal great vessels including the cervical carotids. 1.2 cm hypoattenuating left thyroid nodule. IMPRESSION: CT head: 1. Contusive changes of the left parieto-occipital scalp with scalp hematoma measuring up to 3 mm in maximal thickness. No subjacent calvarial fracture. 2. No acute intracranial abnormality. 3. Stable regions of encephalomalacia and parenchymal calcification bilateral occipital lobes and remote lacunar infarcts in the bilateral basal ganglia. 4. Moderate parenchymal volume loss and chronic microvascular ischemic changes. CT cervical spine: 1. No acute fracture or traumatic listhesis of the cervical spine. 2. Multilevel degenerative disc disease and facet hypertrophic changes of the cervical spine, most pronounced at C5-6 but with only mild canal stenosis and mild to moderate left foraminal narrowing. 3. 1.2 cm hypoattenuating left thyroid nodule. Per size criteria, no further imaging evaluation is warranted. This follows consensus guidelines: Managing Incidental Thyroid Nodules Detected on Imaging: White Paper of the ACR Incidental Thyroid Findings Committee. J Am Coll Radiol 2015; 12:143-150. and [facility] 3-tiered system for managing ITNs: J Am Coll Radiol. 2015; Feb;12(2): 143-50 4. Aortic Atherosclerosis (ICD10-I70.0). 5. Cervical and intracranial atherosclerosis.  Electronically Signed   By: Lovena Le M.D.   On: 07/17/2019 19:28   CT Cervical Spine Wo Contrast  Result Date: 07/17/2019 CLINICAL DATA:  Fall, loss of consciousness, possible head strike EXAM: CT HEAD WITHOUT CONTRAST CT CERVICAL SPINE WITHOUT CONTRAST TECHNIQUE: Multidetector CT imaging of the head and cervical spine was performed following the standard protocol without intravenous contrast. Multiplanar CT image reconstructions of the cervical spine were also generated. COMPARISON:  CT head and cervical spine 04/27/2019 FINDINGS: CT HEAD FINDINGS Brain: Stable regions of encephalomalacia and parenchymal calcification bilateral occipital lobes. Remote lacunar infarcts in the bilateral basal ganglia are noted as well. Senescent basal ganglia mineralization is present. No evidence of acute infarction, hemorrhage, hydrocephalus, extra-axial collection or mass lesion/mass effect. Symmetric prominence of the ventricles, cisterns and sulci compatible with parenchymal volume loss. Patchy areas of white matter hypoattenuation are most compatible with chronic microvascular angiopathy. Vascular: Extensive atherosclerotic calcification of the carotid siphons and intradural vertebral arteries. No hyperdense vessel. Skull: Contusive  changes of the left parieto-occipital scalp with small crescentic hematoma measuring up to 3 mm in maximal thickness. No subjacent calvarial fracture. No acute osseous injury or suspicious osseous lesions. Benign hyperostosis frontalis interna is noted incidentally. Sinuses/Orbits: Paranasal sinuses and mastoid air cells are predominantly clear. Insert mastoids middle ear cavities are clear. Orbital structures are unremarkable aside from prior left lens extraction. Other: Moderate bilateral TMJ arthrosis. CT CERVICAL SPINE FINDINGS Alignment: Stabilization collar absent at the time of examination. No evidence of traumatic listhesis. No abnormally widened, perched or jumped facets. Normal  alignment of the craniocervical and atlantoaxial articulations. Skull base and vertebrae: No acute fracture. No primary bone lesion or focal pathologic process. Soft tissues and spinal canal: Mineralization about the dens likely reflect calcium pyrophosphate deposition. No pre or paravertebral fluid or swelling. No visible canal hematoma. Disc levels: Multilevel intervertebral disc height loss with spondylitic endplate changes. Features most pronounced at C5-6 where a small posterior disc osteophyte complex effaces the ventral thecal sac with only mild canal stenosis. Multilevel uncinate spurring and facet hypertrophic changes are noted as well also most pronounced at C5-6 but with only mild to moderate left foraminal narrowing. Upper chest: Extensive biapical pleuroparenchymal scarring is noted. No acute abnormality in the upper chest or imaged lung apices. Other: Extensive atherosclerotic calcification of the thoracic aortic arch and proximal great vessels including the cervical carotids. 1.2 cm hypoattenuating left thyroid nodule. IMPRESSION: CT head: 1. Contusive changes of the left parieto-occipital scalp with scalp hematoma measuring up to 3 mm in maximal thickness. No subjacent calvarial fracture. 2. No acute intracranial abnormality. 3. Stable regions of encephalomalacia and parenchymal calcification bilateral occipital lobes and remote lacunar infarcts in the bilateral basal ganglia. 4. Moderate parenchymal volume loss and chronic microvascular ischemic changes. CT cervical spine: 1. No acute fracture or traumatic listhesis of the cervical spine. 2. Multilevel degenerative disc disease and facet hypertrophic changes of the cervical spine, most pronounced at C5-6 but with only mild canal stenosis and mild to moderate left foraminal narrowing. 3. 1.2 cm hypoattenuating left thyroid nodule. Per size criteria, no further imaging evaluation is warranted. This follows consensus guidelines: Managing Incidental  Thyroid Nodules Detected on Imaging: White Paper of the ACR Incidental Thyroid Findings Committee. J Am Coll Radiol 2015; 12:143-150. and [facility] 3-tiered system for managing ITNs: J Am Coll Radiol. 2015; Feb;12(2): 143-50 4. Aortic Atherosclerosis (ICD10-I70.0). 5. Cervical and intracranial atherosclerosis. Electronically Signed   By: Lovena Le M.D.   On: 07/17/2019 19:28   DG Hand Complete Right  Result Date: 07/17/2019 CLINICAL DATA:  Weakness and fall EXAM: RIGHT HAND - COMPLETE 3+ VIEW COMPARISON:  None. FINDINGS: Possible subacute to chronic fracture deformity of the fifth proximal phalanx. No subluxation. Joint space narrowing involving the D IP and PIP joints. Mild degenerative change at the first [facility] joint. IMPRESSION: 1. Possible subacute to chronic fracture deformity involving the fifth proximal phalanx. 2. Arthritis within the digits. Electronically Signed   By: Donavan Foil M.D.   On: 07/17/2019 19:10    ____________________________________________    PROCEDURES  Procedure(s) performed:    Procedures    Medications - No data to display   ____________________________________________   INITIAL IMPRESSION / ASSESSMENT AND PLAN / ED COURSE  Pertinent labs & imaging results that were available during my care of the patient were reviewed by me and considered in my medical decision making (see chart for details).  Review of the [place] CSRS was performed in accordance of the [place] prior  to dispensing any controlled drugs.           Patient's diagnosis is consistent with fall, weakness, abrasions of the elbow and index finger.  Patient presents emergency department via EMS after sustaining a reported mechanical fall.  Patient reportedly tripped over her walker.  She sustained abrasions to the elbow, index finger on the right upper extremity.  Patient states that she has felt more weak than normal.  Patient was able to answer questions appropriately.  Given the weakness, fall  patient was evaluated with imaging, labs, urinalysis.  Results are reassuring.  No evidence of acute traumatic injury.  Patient has rib fractures but this is from previous fall which time she had been transferred to [facility] for hemopneumothorax.  Patient at this time endorsed skin tears to the elbow and finger which are thoroughly cleansed and wrapped here in the emergency department.  Patient is stable for discharge at this time.  No indication for further work-up.  Follow-up with primary care as needed.. Patient is given ED precautions to return to the ED for any worsening or new symptoms.     ____________________________________________  FINAL CLINICAL IMPRESSION(S) / ED DIAGNOSES  Final diagnoses:  Fall, initial encounter  Weakness  Abrasions of multiple sites      NEW MEDICATIONS STARTED DURING THIS VISIT:  ED Discharge Orders    None          This chart was dictated using voice recognition software/Dragon. Despite best efforts to proofread, errors can occur which can change the meaning. Any change was purely unintentional.    Darletta Moll, PA-C 07/17/19 2037    Earleen Newport, MD 07/17/19 2253

## 2019-07-17 NOTE — ED Triage Notes (Signed)
Pt arrives via EMS from [facility] after having a fall- per EMS pt had a 3 wheel walker and thinks the front wheel hit the transition in the floor and pt fell- pt very unsteady on her feet- pt also confused and asking questions like "Why am I bleeding?"

## 2019-07-17 NOTE — ED Notes (Signed)
Pt assisted on bedpan.

## 2019-07-17 NOTE — ED Provider Notes (Signed)
Patient was seen and examined by me, I agree with the impression and plan as dictated by Betha Loa PA.  Patient fell today and otherwise has had an unremarkable work-up.  Rib fractures are old.  She is medically cleared for outpatient follow-up.   Earleen Newport, MD 07/17/19 2028

## 2019-07-21 ENCOUNTER — Encounter: Payer: Self-pay | Admitting: Internal Medicine

## 2019-07-21 ENCOUNTER — Other Ambulatory Visit: Payer: Self-pay

## 2019-07-21 ENCOUNTER — Ambulatory Visit: Payer: Medicare Other | Admitting: Internal Medicine

## 2019-07-21 VITALS — BP 155/87 | HR 77

## 2019-07-21 DIAGNOSIS — S5012XD Contusion of left forearm, subsequent encounter: Secondary | ICD-10-CM

## 2019-07-21 DIAGNOSIS — S0003XD Contusion of scalp, subsequent encounter: Secondary | ICD-10-CM | POA: Diagnosis not present

## 2019-07-21 DIAGNOSIS — M25512 Pain in left shoulder: Secondary | ICD-10-CM | POA: Diagnosis not present

## 2019-07-21 DIAGNOSIS — W19XXXA Unspecified fall, initial encounter: Secondary | ICD-10-CM | POA: Diagnosis not present

## 2019-07-21 DIAGNOSIS — R0781 Pleurodynia: Secondary | ICD-10-CM | POA: Diagnosis not present

## 2019-07-21 DIAGNOSIS — W19XXXD Unspecified fall, subsequent encounter: Secondary | ICD-10-CM

## 2019-07-21 DIAGNOSIS — Y92129 Unspecified place in nursing home as the place of occurrence of the external cause: Secondary | ICD-10-CM | POA: Diagnosis not present

## 2019-07-21 NOTE — Progress Notes (Signed)
Subjective:    Patient ID: Carrie Pratt, female    DOB: [birth year], 86 y.o.   MRN: [id number]  HPI  Resident seen in [address] for ER Followup. She presented to the ER 5/15 after a fall. She tripped over her walker. She was confused after the fall. Labs were unremarkable. Chest xray showed mildly displaced fractures of the right 7th and 8th ribs, which were felt to be old..She had a fall 2 months prior, multiple ride side rib fractures, which she still c/o pain in today. CT head showed a scalp hematoma. She was discharged back to [facility].   Today, she reports left shoulder pain and right side rib pain. She gets Tylenol as needed for pain.   Review of Systems      Past Medical History:  Diagnosis Date  . Asthma    as a child  . Bell's palsy   . Diverticulosis   . Environmental and seasonal allergies   . Hyperlipidemia   . Hypertension   . IBS (irritable bowel syndrome)   . Lower extremity edema   . Macular degeneration, wet ([facility])   . Migraines   . Osteoporosis   . TIA (transient ischemic attack)   . Type 2 diabetes mellitus with polyneuropathy ([facility])     Current Outpatient Medications  Medication Sig Dispense Refill  . acetaminophen (TYLENOL) 650 MG CR tablet Take 650 mg by mouth 2 (two) times daily.    . cetirizine (ZYRTEC) 10 MG tablet Take 10 mg by mouth daily.    . clopidogrel (PLAVIX) 75 MG tablet Take 75 mg by mouth daily.     . Cranberry-Vitamin C (AZO CRANBERRY URINARY TRACT) 250-60 MG CAPS Take 2 capsules by mouth at bedtime.    Marland Kitchen glimepiride (AMARYL) 4 MG tablet Take 2 mg by mouth daily with breakfast.    . hydrocortisone (PROCTOZONE-HC) 2.5 % rectal cream Place 1 application rectally at bedtime.    . metFORMIN (GLUCOPHAGE-XR) 500 MG 24 hr tablet Take 1,000 mg by mouth daily at 6 PM. at    . oxybutynin (DITROPAN) 5 MG tablet Take 5 mg by mouth at bedtime.    . polyethylene glycol (MIRALAX / GLYCOLAX) packet Take 17 g by mouth daily. 14 each 0  . psyllium (METAMUCIL)  58.6 % powder Take 1 packet by mouth daily.    . Vitamin D, Ergocalciferol, (DRISDOL) 1.25 MG (50000 UNIT) CAPS capsule Take 50,000 Units by mouth every 7 (seven) days. Wednesday    . rosuvastatin (CRESTOR) 20 MG tablet Take 20 mg by mouth daily.      No current facility-administered medications for this visit.    Allergies  Allergen Reactions  . Erythromycin Diarrhea    History reviewed. No pertinent family history.  Social History   Socioeconomic History  . Marital status: Widowed    Spouse name: Not on file  . Number of children: 2  . Years of education: Not on file  . Highest education level: Not on file  Occupational History  . Occupation: Teacher--private school (all levels)    Comment: Retired  Tobacco Use  . Smoking status: Former Smoker    Quit date: 03/04/1998    Years since quitting: 21.3  . Smokeless tobacco: Never Used  Substance and Sexual Activity  . Alcohol use: Yes    Comment: occasional  . Drug use: No  . Sexual activity: Not on file  Other Topics Concern  . Not on file  Social History Narrative   Widowed  1977   2 sons      Not sure about living will   Sons should be health care POA   Would accept resuscitation   No tube feeds if cognitively unaware   Social Determinants of Health   Financial Resource Strain:   . Difficulty of Paying Living Expenses:   Food Insecurity:   . Worried About Charity fundraiser in the Last Year:   . Arboriculturist in the Last Year:   Transportation Needs:   . Film/video editor (Medical):   Marland Kitchen Lack of Transportation (Non-Medical):   Physical Activity:   . Days of Exercise per Week:   . Minutes of Exercise per Session:   Stress:   . Feeling of Stress :   Social Connections:   . Frequency of Communication with Friends and Family:   . Frequency of Social Gatherings with Friends and Family:   . Attends Religious Services:   . Active Member of Clubs or Organizations:   . Attends Archivist  Meetings:   Marland Kitchen Marital Status:   Intimate Partner Violence:   . Fear of Current or Ex-Partner:   . Emotionally Abused:   Marland Kitchen Physically Abused:   . Sexually Abused:      Constitutional: Denies fever, malaise, fatigue, headache or abrupt weight changes.  Respiratory: Denies difficulty breathing, shortness of breath, cough or sputum production.   Cardiovascular: Denies chest pain, chest tightness, palpitations or swelling in the hands or feet.  Musculoskeletal: Pt reports left shoulder pain, right side rib pain. Denies decrease in range of motion,  muscle pain or joint swelling.  Neurological: Pt does not recall the fall or the ER visit. Denies dizziness, difficulty with speech or problems with balance and coordination.    No other specific complaints in a complete review of systems (except as listed in HPI above).  Objective:   Physical Exam   BP (!) 155/87   Pulse 77  Wt Readings from Last 3 Encounters:  07/17/19 140 lb (63.5 kg)  04/27/19 137 lb (62.1 kg)  04/15/19 126 lb 3.2 oz (57.2 kg)    General: Appears her stated age, well developed, well nourished in NAD. Skin: Abrasion noted to left forearm. Hematoma noted to left side of back of scalp. HEENT: Head: normal shape and size; Eyes: sclera white, no icterus, conjunctiva pink, PERRLA and EOMs intact;  Cardiovascular: Normal rate and rhythm. S1,S2 noted.  No murmur, rubs or gallops noted. Pulmonary/Chest: Normal effort and positive vesicular breath sounds. No respiratory distress. No wheezes, rales or ronchi noted.  Musculoskeletal: Decreased external rotation of the left shoulder, normal internal rotation. Pain with abduction of the left shoulder, normal adduction. Pain with palpation over the left AC joint. Pain with palpation over the right lower anterolateral ribs.  Neurological: Alert and oriented. Can not recall the fall or the ER visit.   BMET    Component Value Date/Time   NA 140 07/17/2019 1817   K 3.5 07/17/2019  1817   CL 103 07/17/2019 1817   CO2 23 07/17/2019 1817   GLUCOSE 159 (H) 07/17/2019 1817   BUN 13 07/17/2019 1817   CREATININE 0.80 07/17/2019 1817   CALCIUM 9.3 07/17/2019 1817   GFRNONAA >60 07/17/2019 1817   GFRAA >60 07/17/2019 1817    Lipid Panel  No results found for: CHOL, TRIG, HDL, CHOLHDL, VLDL, LDLCALC  CBC    Component Value Date/Time   WBC 9.3 07/17/2019 1817   RBC 5.03 07/17/2019 1817  HGB 14.6 07/17/2019 1817   HCT 43.2 07/17/2019 1817   PLT 293 07/17/2019 1817   MCV 85.9 07/17/2019 1817   MCH 29.0 07/17/2019 1817   MCHC 33.8 07/17/2019 1817   RDW 14.0 07/17/2019 1817   LYMPHSABS 2.2 07/17/2019 1817   MONOABS 1.0 07/17/2019 1817   EOSABS 0.3 07/17/2019 1817   BASOSABS 0.1 07/17/2019 1817    Hgb A1C No results found for: HGBA1C         Assessment & Plan:   ER Follow Up for Scalp Hematoma, Left Shoulder Pain, Right Side Rib Pain, Abrasion Left Forearm s/p Fall:  ER notes, labs and imaging reviewed. Given multiples falls, will check A1C to make sure she is not hypoglycemic Will d/w Dr. Silvio Pate about possibly stopping Plavix due to frequent falls PT/OT eval Meds reviewed, none that should be contributing to falls  Will follow up after labs are back, will reassess as needed Webb Silversmith, NP This visit occurred during the SARS-CoV-2 public health emergency.  Safety protocols were in place, including screening questions prior to the visit, additional usage of staff PPE, and extensive cleaning of exam room while observing appropriate contact time as indicated for disinfecting solutions.

## 2019-07-21 NOTE — Patient Instructions (Signed)

## 2019-07-22 ENCOUNTER — Emergency Department
Admission: EM | Admit: 2019-07-22 | Discharge: 2019-07-22 | Disposition: A | Discharge status: Other Acute Inpatient Hospital | Payer: Medicare Other | Attending: Emergency Medicine | Admitting: Emergency Medicine

## 2019-07-22 ENCOUNTER — Other Ambulatory Visit: Payer: Self-pay

## 2019-07-22 ENCOUNTER — Emergency Department: Payer: Medicare Other

## 2019-07-22 DIAGNOSIS — Y92099 Unspecified place in other non-institutional residence as the place of occurrence of the external cause: Secondary | ICD-10-CM | POA: Insufficient documentation

## 2019-07-22 DIAGNOSIS — Z79899 Other long term (current) drug therapy: Secondary | ICD-10-CM | POA: Insufficient documentation

## 2019-07-22 DIAGNOSIS — Z8673 Personal history of transient ischemic attack (TIA), and cerebral infarction without residual deficits: Secondary | ICD-10-CM | POA: Diagnosis not present

## 2019-07-22 DIAGNOSIS — S0990XA Unspecified injury of head, initial encounter: Secondary | ICD-10-CM | POA: Diagnosis present

## 2019-07-22 DIAGNOSIS — Z87891 Personal history of nicotine dependence: Secondary | ICD-10-CM | POA: Insufficient documentation

## 2019-07-22 DIAGNOSIS — I10 Essential (primary) hypertension: Secondary | ICD-10-CM | POA: Diagnosis not present

## 2019-07-22 DIAGNOSIS — Y999 Unspecified external cause status: Secondary | ICD-10-CM | POA: Diagnosis not present

## 2019-07-22 DIAGNOSIS — W19XXXA Unspecified fall, initial encounter: Secondary | ICD-10-CM | POA: Diagnosis not present

## 2019-07-22 DIAGNOSIS — R918 Other nonspecific abnormal finding of lung field: Secondary | ICD-10-CM | POA: Diagnosis not present

## 2019-07-22 DIAGNOSIS — R410 Disorientation, unspecified: Secondary | ICD-10-CM | POA: Diagnosis not present

## 2019-07-22 DIAGNOSIS — M160 Bilateral primary osteoarthritis of hip: Secondary | ICD-10-CM | POA: Diagnosis not present

## 2019-07-22 DIAGNOSIS — Z20822 Contact with and (suspected) exposure to covid-19: Secondary | ICD-10-CM | POA: Diagnosis not present

## 2019-07-22 DIAGNOSIS — R0902 Hypoxemia: Secondary | ICD-10-CM | POA: Diagnosis not present

## 2019-07-22 DIAGNOSIS — E785 Hyperlipidemia, unspecified: Secondary | ICD-10-CM | POA: Diagnosis not present

## 2019-07-22 DIAGNOSIS — Z743 Need for continuous supervision: Secondary | ICD-10-CM | POA: Diagnosis not present

## 2019-07-22 DIAGNOSIS — G43909 Migraine, unspecified, not intractable, without status migrainosus: Secondary | ICD-10-CM | POA: Diagnosis not present

## 2019-07-22 DIAGNOSIS — Y33XXXA Other specified events, undetermined intent, initial encounter: Secondary | ICD-10-CM | POA: Diagnosis not present

## 2019-07-22 DIAGNOSIS — R58 Hemorrhage, not elsewhere classified: Secondary | ICD-10-CM | POA: Diagnosis not present

## 2019-07-22 DIAGNOSIS — Z7902 Long term (current) use of antithrombotics/antiplatelets: Secondary | ICD-10-CM | POA: Diagnosis not present

## 2019-07-22 DIAGNOSIS — R279 Unspecified lack of coordination: Secondary | ICD-10-CM | POA: Diagnosis not present

## 2019-07-22 DIAGNOSIS — Y9301 Activity, walking, marching and hiking: Secondary | ICD-10-CM | POA: Insufficient documentation

## 2019-07-22 DIAGNOSIS — Z5181 Encounter for therapeutic drug level monitoring: Secondary | ICD-10-CM | POA: Diagnosis not present

## 2019-07-22 DIAGNOSIS — S3991XA Unspecified injury of abdomen, initial encounter: Secondary | ICD-10-CM | POA: Diagnosis not present

## 2019-07-22 DIAGNOSIS — S065X9A Traumatic subdural hemorrhage with loss of consciousness of unspecified duration, initial encounter: Secondary | ICD-10-CM | POA: Diagnosis not present

## 2019-07-22 DIAGNOSIS — M8588 Other specified disorders of bone density and structure, other site: Secondary | ICD-10-CM | POA: Diagnosis not present

## 2019-07-22 DIAGNOSIS — J45909 Unspecified asthma, uncomplicated: Secondary | ICD-10-CM | POA: Diagnosis not present

## 2019-07-22 DIAGNOSIS — M810 Age-related osteoporosis without current pathological fracture: Secondary | ICD-10-CM | POA: Diagnosis not present

## 2019-07-22 DIAGNOSIS — S065X0A Traumatic subdural hemorrhage without loss of consciousness, initial encounter: Secondary | ICD-10-CM | POA: Diagnosis not present

## 2019-07-22 DIAGNOSIS — S199XXA Unspecified injury of neck, initial encounter: Secondary | ICD-10-CM | POA: Diagnosis not present

## 2019-07-22 DIAGNOSIS — S299XXA Unspecified injury of thorax, initial encounter: Secondary | ICD-10-CM | POA: Diagnosis not present

## 2019-07-22 DIAGNOSIS — E041 Nontoxic single thyroid nodule: Secondary | ICD-10-CM | POA: Diagnosis not present

## 2019-07-22 DIAGNOSIS — Y92129 Unspecified place in nursing home as the place of occurrence of the external cause: Secondary | ICD-10-CM | POA: Diagnosis not present

## 2019-07-22 DIAGNOSIS — D649 Anemia, unspecified: Secondary | ICD-10-CM | POA: Diagnosis not present

## 2019-07-22 DIAGNOSIS — Z881 Allergy status to other antibiotic agents status: Secondary | ICD-10-CM | POA: Diagnosis not present

## 2019-07-22 DIAGNOSIS — K589 Irritable bowel syndrome without diarrhea: Secondary | ICD-10-CM | POA: Diagnosis not present

## 2019-07-22 DIAGNOSIS — S3993XA Unspecified injury of pelvis, initial encounter: Secondary | ICD-10-CM | POA: Diagnosis not present

## 2019-07-22 DIAGNOSIS — M4854XD Collapsed vertebra, not elsewhere classified, thoracic region, subsequent encounter for fracture with routine healing: Secondary | ICD-10-CM | POA: Diagnosis not present

## 2019-07-22 DIAGNOSIS — E119 Type 2 diabetes mellitus without complications: Secondary | ICD-10-CM | POA: Diagnosis not present

## 2019-07-22 LAB — COMPREHENSIVE METABOLIC PANEL
ALT: 14 U/L (ref 0–44)
AST: 22 U/L (ref 15–41)
Albumin: 4.2 g/dL (ref 3.5–5.0)
Alkaline Phosphatase: 89 U/L (ref 38–126)
Anion gap: 12 (ref 5–15)
BUN: 12 mg/dL (ref 8–23)
CO2: 24 mmol/L (ref 22–32)
Calcium: 9.1 mg/dL (ref 8.9–10.3)
Chloride: 105 mmol/L (ref 98–111)
Creatinine, Ser: 0.59 mg/dL (ref 0.44–1.00)
GFR calc Af Amer: >60 mL/min (ref >60)
GFR calc non Af Amer: >60 mL/min (ref >60)
Glucose, Bld: 141 mg/dL — ABNORMAL HIGH (ref 70–99)
Potassium: 3.3 mmol/L — ABNORMAL LOW (ref 3.5–5.1)
Sodium: 141 mmol/L (ref 135–145)
Total Bilirubin: 1.1 mg/dL (ref 0.3–1.2)
Total Protein: 7.2 g/dL (ref 6.5–8.1)

## 2019-07-22 LAB — CBC WITH DIFFERENTIAL/PLATELET
Abs Immature Granulocytes: 0.08 10*3/uL — ABNORMAL HIGH (ref 0.00–0.07)
Basophils Absolute: 0.1 10*3/uL (ref 0.0–0.1)
Basophils Relative: 1 %
Eosinophils Absolute: 0.2 10*3/uL (ref 0.0–0.5)
Eosinophils Relative: 2 %
HCT: 42.8 % (ref 36.0–46.0)
Hemoglobin: 13.7 g/dL (ref 12.0–15.0)
Immature Granulocytes: 1 %
Lymphocytes Relative: 10 %
Lymphs Abs: 1.1 10*3/uL (ref 0.7–4.0)
MCH: 28.1 pg (ref 26.0–34.0)
MCHC: 32 g/dL (ref 30.0–36.0)
MCV: 87.7 fL (ref 80.0–100.0)
Monocytes Absolute: 0.7 10*3/uL (ref 0.1–1.0)
Monocytes Relative: 7 %
Neutro Abs: 9 10*3/uL — ABNORMAL HIGH (ref 1.7–7.7)
Neutrophils Relative %: 79 %
Platelets: 263 10*3/uL (ref 150–400)
RBC: 4.88 MIL/uL (ref 3.87–5.11)
RDW: 13.7 % (ref 11.5–15.5)
WBC: 11.1 10*3/uL — ABNORMAL HIGH (ref 4.0–10.5)
nRBC: 0 % (ref 0.0–0.2)

## 2019-07-22 LAB — APTT: aPTT: 33 seconds (ref 24–36)

## 2019-07-22 LAB — URINALYSIS, COMPLETE (UACMP) WITH MICROSCOPIC
Bacteria, UA: NONE SEEN
Bilirubin Urine: NEGATIVE
Glucose, UA: NEGATIVE mg/dL
Hgb urine dipstick: NEGATIVE
Ketones, ur: NEGATIVE mg/dL
Leukocytes,Ua: NEGATIVE
Nitrite: NEGATIVE
Protein, ur: NEGATIVE mg/dL
Specific Gravity, Urine: 1.004 — ABNORMAL LOW (ref 1.005–1.030)
WBC, UA: NONE SEEN WBC/hpf (ref 0–5)
pH: 7 (ref 5.0–8.0)

## 2019-07-22 LAB — PROTIME-INR
INR: 1 (ref 0.8–1.2)
Prothrombin Time: 12.5 seconds (ref 11.4–15.2)

## 2019-07-22 LAB — MAGNESIUM: Magnesium: 1.8 mg/dL (ref 1.7–2.4)

## 2019-07-22 MED ORDER — ACETAMINOPHEN 325 MG PO TABS
975.00 | ORAL_TABLET | ORAL | Status: DC
Start: ? — End: 2019-07-22

## 2019-07-22 MED ORDER — LABETALOL HCL 5 MG/ML IV SOLN
20.0000 mg | Freq: Once | INTRAVENOUS | Status: AC
  Administered 2019-07-22: 20 mg via INTRAVENOUS
  Filled 2019-07-22: qty 4

## 2019-07-22 MED ORDER — POLYETHYLENE GLYCOL 3350 17 GM/SCOOP PO POWD
17.00 | ORAL | Status: DC
Start: ? — End: 2019-07-22

## 2019-07-22 MED ORDER — ONDANSETRON HCL 4 MG/2ML IJ SOLN
4.0000 mg | INTRAMUSCULAR | Status: AC
  Administered 2019-07-22: 4 mg via INTRAVENOUS
  Filled 2019-07-22: qty 2

## 2019-07-22 MED ORDER — GABAPENTIN 100 MG PO CAPS
100.00 | ORAL_CAPSULE | ORAL | Status: DC
Start: 2019-07-25 — End: 2019-07-22

## 2019-07-22 MED ORDER — ALBUTEROL SULFATE HFA 108 (90 BASE) MCG/ACT IN AERS
1.00 | INHALATION_SPRAY | RESPIRATORY_TRACT | Status: DC
Start: ? — End: 2019-07-22

## 2019-07-22 MED ORDER — LISINOPRIL 5 MG PO TABS
5.00 | ORAL_TABLET | ORAL | Status: DC
Start: 2019-07-26 — End: 2019-07-22

## 2019-07-22 MED ORDER — MORPHINE SULFATE (PF) 2 MG/ML IV SOLN
2.0000 mg | Freq: Once | INTRAVENOUS | Status: AC
  Administered 2019-07-22: 2 mg via INTRAVENOUS
  Filled 2019-07-22: qty 1

## 2019-07-22 MED ORDER — NICARDIPINE HCL IN NACL 20-0.86 MG/200ML-% IV SOLN: 0.0000 mg/h | INTRAVENOUS | Status: DC

## 2019-07-22 MED ORDER — SODIUM CHLORIDE FLUSH 0.9 % IV SOLN
5.00 | INTRAVENOUS | Status: DC
Start: 2019-07-25 — End: 2019-07-22

## 2019-07-22 MED ORDER — ROSUVASTATIN CALCIUM 20 MG PO TABS
20.00 | ORAL_TABLET | ORAL | Status: DC
Start: 2019-07-26 — End: 2019-07-22

## 2019-07-22 MED ORDER — MONTELUKAST SODIUM 10 MG PO TABS
10.00 | ORAL_TABLET | ORAL | Status: DC
Start: 2019-07-26 — End: 2019-07-22

## 2019-07-22 MED ORDER — ONDANSETRON HCL 4 MG/2ML IJ SOLN
4.00 | INTRAMUSCULAR | Status: DC
Start: ? — End: 2019-07-22

## 2019-07-22 MED ORDER — LIDOCAINE HCL 1 % IJ SOLN
0.50 | INTRAMUSCULAR | Status: DC
Start: ? — End: 2019-07-22

## 2019-07-22 MED ORDER — SENNOSIDES-DOCUSATE SODIUM 8.6-50 MG PO TABS
2.00 | ORAL_TABLET | ORAL | Status: DC
Start: ? — End: 2019-07-22

## 2019-07-22 MED ORDER — LEVETIRACETAM IN NACL 500 MG/100ML IV SOLN
500.0000 mg | INTRAVENOUS | Status: AC
  Administered 2019-07-22: 500 mg via INTRAVENOUS
  Filled 2019-07-22: qty 100

## 2019-07-22 NOTE — ED Triage Notes (Addendum)
PT to ED via EMS from [facility] independent living. PT had a fall while using walker to get to restroom this morning. Unsure why she fell. States she thinks her head hurts. PT has also stated multiple times that she has to urinate, even after urinating on self. Skin tear to elbow

## 2019-07-22 NOTE — ED Notes (Addendum)
MD Karma Greaser spoke with pt's son and updated and got verbal permission to transport pt at this time to [facility] ED

## 2019-07-22 NOTE — ED Notes (Signed)
[facility] called on order of Dr Karma Greaser for transfer for a traumatic subdural spoke with Shanon Brow in the transfer Center at [facility], films powershared to [facility] and demographic sheet faxed too

## 2019-07-22 NOTE — ED Notes (Signed)
PT very repetitive and anxious. Called [facility] to find out pt's baseline. States the repetitiveness is new and that pt is normally able to walk as opposed to how pt presented this AM to this RN

## 2019-07-22 NOTE — ED Provider Notes (Addendum)
[facility] Emergency Department Provider Note  ____________________________________________   First MD Initiated Contact with Patient 07/22/19 [phone]     (approximate)  I have reviewed the triage vital signs and the nursing notes.   HISTORY  Chief Complaint Fall  Level 5 caveat:  history/ROS limited by acute/critical illness  HPI Carrie Pratt is a 86 y.o. female  with extensive medical history as listed below which notably includes frequent falls and a transfer to [facility] as recently as about 3 months ago for a fall resulting in multiple rib fractures.  She comes in tonight reportedly after falling at her  living facility [facility]).  She reportedly had a fall using her walker to get to the restroom this morning.  She does not have a good memory of the incident.  She is mostly perseverating on her need to urinate at this time although she does have a documented history of urinary incontinence.  She denies chest pain, shortness of breath, nausea, vomiting, abdominal pain.  She is moving her arms and legs without any difficulty and has no pain in them.  She said that she has a headache and she indicates the left parietal region is the part that hurts.  She also says that she has some pain in her neck.    Of note, she seems confused, and there is no history of dementia documented in her chart.   Past Medical History:  Diagnosis Date  . Asthma    as a child  . Bell's palsy   . Diverticulosis   . Environmental and seasonal allergies   . Hyperlipidemia   . Hypertension   . IBS (irritable bowel syndrome)   . Lower extremity edema   . Macular degeneration, wet ([place])   . Migraines   . Osteoporosis   . TIA (transient ischemic attack)   . Type 2 diabetes mellitus with polyneuropathy [facility])     Patient Active Problem List   Diagnosis Date Noted  . Atherosclerosis of aorta ([place]) 04/15/2019  . OAB (overactive bladder) 02/04/2019  . Osteoporosis 07/01/2018  .  Bleeding internal hemorrhoids   . Advance directive discussed with patient 03/05/2018  . Type 2 diabetes mellitus with polyneuropathy ([place])   . Hypertension   . Hyperlipidemia   . Environmental and seasonal allergies   . Macular degeneration, wet ([place])   . TIA (transient ischemic attack)     Past Surgical History:  Procedure Laterality Date  . CATARACT EXTRACTION W/PHACO Left 05/29/2016   Procedure: CATARACT EXTRACTION PHACO AND INTRAOCULAR LENS PLACEMENT (IOC);  Surgeon: Estill Cotta, MD;  Location: [facility] ORS;  Service: Ophthalmology;  Laterality: Left;  [place]: 01:48.8 AP% 25.2 CDE: 50.41 [id number]  . CHOLECYSTECTOMY    . COLONOSCOPY    . OOPHORECTOMY    . TONSILLECTOMY      Prior to Admission medications   Medication Sig Start Date End Date Taking? Authorizing Provider  acetaminophen (TYLENOL) 650 MG CR tablet Take 650 mg by mouth 2 (two) times daily.    [provider]  cetirizine (ZYRTEC) 10 MG tablet Take 10 mg by mouth daily.    [provider]  clopidogrel (PLAVIX) 75 MG tablet Take 75 mg by mouth daily.  03/14/15   [provider]  Cranberry-Vitamin C (AZO CRANBERRY URINARY TRACT) 250-60 MG CAPS Take 2 capsules by mouth at bedtime.    [provider]  glimepiride (AMARYL) 4 MG tablet Take 2 mg by mouth daily with breakfast.  [provider]  hydrocortisone (PROCTOZONE-HC) 2.5 % rectal cream Place 1 application rectally at bedtime.    [provider]  metFORMIN (GLUCOPHAGE-XR) 500 MG 24 hr tablet Take 1,000 mg by mouth daily at 6 PM. at    [provider]  oxybutynin (DITROPAN) 5 MG tablet Take 5 mg by mouth at bedtime.    [provider]  polyethylene glycol (MIRALAX / GLYCOLAX) packet Take 17 g by mouth daily. 04/04/18   Epifanio Lesches, MD  psyllium (METAMUCIL) 58.6 % powder Take 1 packet by mouth daily.    [provider]  rosuvastatin (CRESTOR) 20 MG tablet Take 20 mg by mouth daily.   01/04/15 07/17/19  [provider]  Vitamin D, Ergocalciferol, (DRISDOL) 1.25 MG (50000 UNIT) CAPS capsule Take 50,000 Units by mouth every 7 (seven) days. Wednesday    [provider]    Allergies Erythromycin  History reviewed. No pertinent family history.  Social History Social History   Tobacco Use  . Smoking status: Former Smoker    Quit date: 03/04/1998    Years since quitting: 21.3  . Smokeless tobacco: Never Used  Substance Use Topics  . Alcohol use: Yes    Comment: occasional  . Drug use: No    Review of Systems Level 5 caveat:  history/ROS limited by acute/critical illness  Constitutional: +Fall.  No fever/chills Eyes: No visual changes. ENT: No sore throat. Cardiovascular: Denies chest pain. Respiratory: Denies shortness of breath. Gastrointestinal: No abdominal pain.  No nausea, no vomiting.  No diarrhea.  No constipation. Genitourinary: Urinary frequency and incontinence. Negative for dysuria. Musculoskeletal: Negative for neck pain.  Negative for back pain. Integumentary: +skin tear Neurological: Patient seems confused.  Generalized headache, worse on the left side of her head, no focal weakness or numbness.   ____________________________________________   PHYSICAL EXAM:  VITAL SIGNS: ED Triage Vitals  Enc Vitals Group     BP 07/22/19 0521 (!) 180/88     Pulse Rate 07/22/19 0521 (!) 105     Resp 07/22/19 0521 20     Temp 07/22/19 0525 98.1 F (36.7 C)     Temp Source 07/22/19 0525 Oral     SpO2 07/22/19 0521 99 %     Weight 07/22/19 0522 58.1 kg (128 lb)     Height 07/22/19 0522 1.6 m (5\' 3" )     Head Circumference --      Peak Flow --      Pain Score 07/22/19 0522 7     Pain Loc --      Pain Edu? --      Excl. in [place]? --     Constitutional: Awake and alert.  Slightly agitated but not in significant distress.  Eyes: Conjunctivae are normal.  Head: Contusion to left parietal region of scalp goal.  Tenderness to palpation.  No  laceration.  Negative battle sign, negative raccoon eyes. Nose: No congestion/rhinnorhea. Mouth/Throat: Patient is wearing a mask. Neck: No stridor.  No meningeal signs.   Cardiovascular: Mild tachycardia, regular rhythm. Good peripheral circulation. Grossly normal heart sounds. Respiratory: Normal respiratory effort.  No retractions. Gastrointestinal: Soft and nontender. No distention.  Musculoskeletal: No lower extremity tenderness nor edema. No gross deformities of extremities except for skin tears as described below. Neurologic:  GCS 14.  Normal speech and language. No gross focal neurologic deficits are appreciated.  She is moving all 4 extremities.  Unable to participate in directed neurological exam. Skin:  Skin is warm, dry and intact  except for skin tear near left elbow, no indication for sutures, wound care only.   ____________________________________________   LABS (all labs ordered are listed, but only abnormal results are displayed)  Labs Reviewed  URINALYSIS, COMPLETE (UACMP) WITH MICROSCOPIC - Abnormal; Notable for the following components:      Result Value   Color, Urine STRAW (*)    APPearance CLEAR (*)    Specific Gravity, Urine 1.004 (*)    All other components within normal limits  CBC WITH DIFFERENTIAL/PLATELET - Abnormal; Notable for the following components:   WBC 11.1 (*)    Neutro Abs 9.0 (*)    Abs Immature Granulocytes 0.08 (*)    All other components within normal limits  COMPREHENSIVE METABOLIC PANEL - Abnormal; Notable for the following components:   Potassium 3.3 (*)    Glucose, Bld 141 (*)    All other components within normal limits  PROTIME-INR  APTT  MAGNESIUM  TYPE AND SCREEN   ____________________________________________  EKG  No indication for emergent EKG ____________________________________________  RADIOLOGY Ursula Alert, personally viewed and evaluated these images (plain radiographs) as part of my medical decision making, as  well as reviewing the written report by the radiologist.  I also discussed the case with the radiologist by phone.  ED MD interpretation: Acute subdural hematoma with minimal mass-effect and midline shift.  Negative cervical spine fracture.  Official radiology report(s): CT Head Wo Contrast  Result Date: 07/22/2019 CLINICAL DATA:  Fall with head pain EXAM: CT HEAD WITHOUT CONTRAST CT CERVICAL SPINE WITHOUT CONTRAST TECHNIQUE: Multidetector CT imaging of the head and cervical spine was performed following the standard protocol without intravenous contrast. Multiplanar CT image reconstructions of the cervical spine were also generated. COMPARISON:  Five days ago FINDINGS: CT HEAD FINDINGS Brain: High-density subdural hematoma along the right cerebral convexity measuring up to 13 mm in thickness. Cortical mass effect is mild. Remote bilateral occipital cortex infarct. No evidence of acute infarct. Chronic small vessel ischemia in the deep cerebral white matter. Vascular: Atherosclerotic calcification. Skull: No acute fracture. Sinuses/Orbits: Left cataract resection.  No evidence of injury. CT CERVICAL SPINE FINDINGS Alignment: No traumatic malalignment. Skull base and vertebrae: No acute fracture. Mild T2 superior endplate concavity that is chronic. Soft tissues and spinal canal: No prevertebral fluid or swelling. No visible canal hematoma. Disc levels:  Ordinary degenerative changes for age. Upper chest: Biapical pleural based scarring.  No evidence of injury Critical Value/emergent results were called by telephone at the time of interpretation on 07/22/2019 at 7:14 am to provider [facility] , who verbally acknowledged these results. IMPRESSION: 1. Acute subdural hematoma along the right cerebral convexity measuring up to 15 mm in thickness. 2. Negative for cervical spine fracture. Electronically Signed   By: Monte Fantasia M.D.   On: 07/22/2019 07:15   CT Cervical Spine Wo Contrast  Result Date:  07/22/2019 CLINICAL DATA:  Fall with head pain EXAM: CT HEAD WITHOUT CONTRAST CT CERVICAL SPINE WITHOUT CONTRAST TECHNIQUE: Multidetector CT imaging of the head and cervical spine was performed following the standard protocol without intravenous contrast. Multiplanar CT image reconstructions of the cervical spine were also generated. COMPARISON:  Five days ago FINDINGS: CT HEAD FINDINGS Brain: High-density subdural hematoma along the right cerebral convexity measuring up to 13 mm in thickness. Cortical mass effect is mild. Remote bilateral occipital cortex infarct. No evidence of acute infarct. Chronic small vessel ischemia in the deep cerebral white matter. Vascular: Atherosclerotic calcification. Skull: No acute fracture. Sinuses/Orbits: Left  cataract resection.  No evidence of injury. CT CERVICAL SPINE FINDINGS Alignment: No traumatic malalignment. Skull base and vertebrae: No acute fracture. Mild T2 superior endplate concavity that is chronic. Soft tissues and spinal canal: No prevertebral fluid or swelling. No visible canal hematoma. Disc levels:  Ordinary degenerative changes for age. Upper chest: Biapical pleural based scarring.  No evidence of injury Critical Value/emergent results were called by telephone at the time of interpretation on 07/22/2019 at 7:14 am to provider [facility] , who verbally acknowledged these results. IMPRESSION: 1. Acute subdural hematoma along the right cerebral convexity measuring up to 15 mm in thickness. 2. Negative for cervical spine fracture. Electronically Signed   By: Monte Fantasia M.D.   On: 07/22/2019 07:15    ____________________________________________   PROCEDURES   Procedure(s) performed (including Critical Care):  .Critical Care Performed by: Hinda Kehr, MD Authorized by: Hinda Kehr, MD   Critical care provider statement:    Critical care time (minutes):  60   Critical care time was exclusive of:  Separately billable procedures and treating  other patients   Critical care was necessary to treat or prevent imminent or life-threatening deterioration of the following conditions:  Trauma and CNS failure or compromise   Critical care was time spent personally by me on the following activities:  Development of treatment plan with patient or surrogate, discussions with consultants, evaluation of patient's response to treatment, examination of patient, obtaining history from patient or surrogate, ordering and performing treatments and interventions, ordering and review of laboratory studies, ordering and review of radiographic studies, pulse oximetry, re-evaluation of patient's condition and review of old charts     ____________________________________________   [place] / MDM / [place] / ED COURSE  As part of my medical decision making, I reviewed the following data within the electronic MEDICAL RECORD NUMBER History obtained from family, Nursing notes reviewed and incorporated, Labs reviewed , Old EKG reviewed, Old chart reviewed, Discussed with neurosurgery (Dr. Izora Ribas), Discussed with radiologist and reviewed Notes from prior ED visits   Differential diagnosis includes, but is not limited to, delirium, acute infection, intracranial bleeding, urinary tract infection.  The patient has a history of frequent falls but no hx of altered mental status.  Lives in independent living.  Her current presentation is a change from her baseline.  Attempting to find more information from her living facility.  The patient is on the cardiac monitor to evaluate for evidence of arrhythmia and/or significant heart rate changes.     Clinical Course as of Jul 22 827  Thu Jul 22, 2019  0715 Dr. Pascal Lux with radiology called to report subdural hematoma.  Calling Dr. Izora Ribas to discuss.   [CF]  [id number] Discussed with Dr. Izora Ribas with NSG.  Recommends transfer, SBP <160, Keppra 500 mg IV bolus.  Calling [facility] transfer center   [CF]  [phone]  Patient remains awake and alert but altered   [CF]  [phone] I spoke by phone with a representative from the [facility] transfer center.  I explained the case and since she is a traumatic intracranial bleed, they are able to auto except the case as an ED to ED transfer.  Accepting physician is Dr. Paschal Dopp, [place], ED to ED, [place].   [CF]  [phone] I spoke by phone with the patient's son, Sudie Bailey.  I explained the situation including the guarded prognosis and the need for emergent transfer and he agrees.   [CF]  [phone] Transfer by Rob Hickman  [facility] (ground or air) was going to take about an hour.  We contacted [facility] for emergency traffic transfer and they were here within minutes because they were already in the parking lot.  They are going to wait for the patient to receive her medications prior to transfer.  She is receiving Keppra 500 mg IV as recommended and I have switched the order from nicardipine to labetalol 20 mg IV because EMS cannot transfer a patient on an infusion and she needs improved blood pressure control prior to transfer.   [CF]  0749 Patient also received morphine 2 mg IV and Zofran 4 mg IV.   [CF]  [id number] Lab work is stable.  Patient is receiving her medication and will be transferred.  Blood pressure status post labetalol 20 mg IV as 139/76 which is appropriate.  Patient stable for transfer.   [CF]  0807 Normal UA  Urinalysis, Complete w Microscopic(!) [CF]    Clinical Course User Index [CF] Hinda Kehr, MD     ____________________________________________  FINAL CLINICAL IMPRESSION(S) / ED DIAGNOSES  Final diagnoses:  Traumatic subdural hematoma with loss of consciousness, initial encounter ([place])  Confusion     MEDICATIONS GIVEN DURING THIS VISIT:  Medications  levETIRAcetam (KEPPRA) IVPB 500 mg/100 mL premix (0 mg Intravenous Stopped 07/22/19 0811)  morphine 2 MG/ML injection 2 mg (2 mg Intravenous Given 07/22/19 0744)  ondansetron  (ZOFRAN) injection 4 mg (4 mg Intravenous Given 07/22/19 0744)  labetalol (NORMODYNE) injection 20 mg (20 mg Intravenous Given 07/22/19 [id number])     ED Discharge Orders    None      *Please note:  Zyria Pomarico Stanforth was evaluated in Emergency Department on 07/22/2019 for the symptoms described in the history of present illness. She was evaluated in the context of the global COVID-19 pandemic, which necessitated consideration that the patient might be at risk for infection with the SARS-CoV-2 virus that causes COVID-19. Institutional protocols and algorithms that pertain to the evaluation of patients at risk for COVID-19 are in a state of rapid change based on information released by regulatory bodies including the CDC and federal and state organizations. These policies and algorithms were followed during the patient's care in the ED.  Some ED evaluations and interventions may be delayed as a result of limited staffing during the pandemic.*  Note:  This document was prepared using Dragon voice recognition software and may include unintentional dictation errors.   Hinda Kehr, MD 07/22/19 [id number]    Hinda Kehr, MD 07/22/19 [phone]

## 2019-07-22 NOTE — ED Notes (Signed)
XRAY  POWERSHARE  WITH  [facility] 

## 2019-07-22 NOTE — ED Notes (Signed)
Pt informed of situation and transfer to [facility].  EMS at bedside

## 2019-07-22 NOTE — ED Notes (Signed)
Attempted to assist pt to commode, pt totally unsteady on feet. Assisted back into bed and put pt on bed pan for bm. PT had soft BM.

## 2019-07-23 ENCOUNTER — Telehealth: Payer: Self-pay

## 2019-07-23 DIAGNOSIS — S065XAA Traumatic subdural hemorrhage with loss of consciousness status unknown, initial encounter: Secondary | ICD-10-CM | POA: Insufficient documentation

## 2019-07-23 DIAGNOSIS — S065X9A Traumatic subdural hemorrhage with loss of consciousness of unspecified duration, initial encounter: Secondary | ICD-10-CM | POA: Insufficient documentation

## 2019-07-23 MED ORDER — METFORMIN HCL ER 500 MG PO TB24
1000.00 | ORAL_TABLET | ORAL | Status: DC
Start: 2019-07-25 — End: 2019-07-23

## 2019-07-23 NOTE — Telephone Encounter (Signed)
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

## 2019-07-23 NOTE — Telephone Encounter (Signed)
Noted, yes I had wanted to stop the Plavix the night before her last fall. Will follow up with her when she returns to [facility]. Will review hospital notes and discuss new findings with family.

## 2019-07-25 DIAGNOSIS — S065X9A Traumatic subdural hemorrhage with loss of consciousness of unspecified duration, initial encounter: Secondary | ICD-10-CM | POA: Diagnosis not present

## 2019-07-26 MED ORDER — ENOXAPARIN SODIUM 30 MG/0.3ML ~~LOC~~ SOLN
30.00 | SUBCUTANEOUS | Status: DC
Start: 2019-07-26 — End: 2019-07-26

## 2019-07-27 DIAGNOSIS — I10 Essential (primary) hypertension: Secondary | ICD-10-CM | POA: Diagnosis not present

## 2019-07-27 DIAGNOSIS — S065X0A Traumatic subdural hemorrhage without loss of consciousness, initial encounter: Secondary | ICD-10-CM | POA: Diagnosis not present

## 2019-07-27 DIAGNOSIS — D120 Benign neoplasm of cecum: Secondary | ICD-10-CM | POA: Diagnosis not present

## 2019-07-27 DIAGNOSIS — N39498 Other specified urinary incontinence: Secondary | ICD-10-CM | POA: Diagnosis not present

## 2019-07-27 DIAGNOSIS — E1142 Type 2 diabetes mellitus with diabetic polyneuropathy: Secondary | ICD-10-CM | POA: Diagnosis not present

## 2019-08-06 DIAGNOSIS — B351 Tinea unguium: Secondary | ICD-10-CM | POA: Diagnosis not present

## 2019-08-09 ENCOUNTER — Telehealth: Payer: Self-pay | Admitting: Internal Medicine

## 2019-08-09 NOTE — Telephone Encounter (Signed)
Great thank you!

## 2019-08-09 NOTE — Telephone Encounter (Signed)
FYI for Dr Silvio Pate, [facility] ddi not have a Referral from [facility]. Your patient got scheduled with Dr Allen Norris for tomorrow 08/10/19 at 1:15pm and patients son has been notified.

## 2019-08-10 ENCOUNTER — Ambulatory Visit (INDEPENDENT_AMBULATORY_CARE_PROVIDER_SITE_OTHER): Payer: Medicare Other | Admitting: Gastroenterology

## 2019-08-10 ENCOUNTER — Other Ambulatory Visit: Payer: Self-pay

## 2019-08-10 ENCOUNTER — Encounter: Payer: Self-pay | Admitting: Gastroenterology

## 2019-08-10 VITALS — BP 119/66 | HR 83 | Ht 62.0 in | Wt 116.2 lb

## 2019-08-10 DIAGNOSIS — C189 Malignant neoplasm of colon, unspecified: Secondary | ICD-10-CM

## 2019-08-10 DIAGNOSIS — K6389 Other specified diseases of intestine: Secondary | ICD-10-CM

## 2019-08-10 NOTE — Progress Notes (Signed)
Gastroenterology Consultation  Referring Provider:     Venia Carbon, MD Primary Care Physician:  Venia Carbon, MD Primary Gastroenterologist:  Dr. Allen Norris     Reason for Consultation:     Cecal mass        HPI:   Carrie Pratt is a 86 y.o. y/o female referred for consultation & management of cecal mass by Dr. Silvio Pate, Theophilus Kinds, MD.  This patient comes to me after having an abnormal CT scan of the abdomen.  The patient had suffered a fall back in February and at that time had a CT scan of the abdomen and and one of the findings showed:  Nonspecific 3.2 x 2.4 cm soft tissue focus in the posterior cecal lumen, cannot exclude a colonic neoplasm. Colonoscopy correlation is indicated and may be performed as clinically warranted  The patient was again in the ER on May 15 and on May 20 for repeated falls.  The patient had been on Plavix.  It was recommended by [facility] neurology to have the patient stop her Plavix.  The patient has a history of anemia but the hemoglobin has increased and is normal since the patient has been off of Plavix.  There is no report of any nausea vomiting fevers chills black stools or bloody stools.  The patient also denies any change in bowel habits.  She did lose her husband at a young age to colon cancer.  The patient comes with her son today who reports that he had a polyp at his last colonoscopy.  Past Medical History:  Diagnosis Date  . Asthma    as a child  . Bell's palsy   . Diverticulosis   . Environmental and seasonal allergies   . Hyperlipidemia   . Hypertension   . IBS (irritable bowel syndrome)   . Lower extremity edema   . Macular degeneration, wet ([place])   . Migraines   . Osteoporosis   . TIA (transient ischemic attack)   . Type 2 diabetes mellitus with polyneuropathy [facility])     Past Surgical History:  Procedure Laterality Date  . CATARACT EXTRACTION W/PHACO Left 05/29/2016   Procedure: CATARACT EXTRACTION PHACO AND INTRAOCULAR LENS  PLACEMENT (IOC);  Surgeon: Estill Cotta, MD;  Location: [facility] ORS;  Service: Ophthalmology;  Laterality: Left;  [place]: 01:48.8 AP% 25.2 CDE: 50.41 [id number]  . CHOLECYSTECTOMY    . COLONOSCOPY    . OOPHORECTOMY    . TONSILLECTOMY      Prior to Admission medications   Medication Sig Start Date End Date Taking? Authorizing Provider  acetaminophen (TYLENOL) 650 MG CR tablet Take 650 mg by mouth 2 (two) times daily.    [provider]  cetirizine (ZYRTEC) 10 MG tablet Take 10 mg by mouth daily.    [provider]  clopidogrel (PLAVIX) 75 MG tablet Take 75 mg by mouth daily.  03/14/15   [provider]  Cranberry-Vitamin C (AZO CRANBERRY URINARY TRACT) 250-60 MG CAPS Take 2 capsules by mouth at bedtime.    [provider]  glimepiride (AMARYL) 4 MG tablet Take 2 mg by mouth daily with breakfast.    [provider]  hydrocortisone (PROCTOZONE-HC) 2.5 % rectal cream Place 1 application rectally at bedtime.    [provider]  metFORMIN (GLUCOPHAGE-XR) 500 MG 24 hr tablet Take 1,000 mg by mouth daily at 6 PM. at    [provider]  oxybutynin (DITROPAN) 5 MG tablet Take 5 mg by mouth at  bedtime.    [provider]  polyethylene glycol (MIRALAX / GLYCOLAX) packet Take 17 g by mouth daily. 04/04/18   Epifanio Lesches, MD  psyllium (METAMUCIL) 58.6 % powder Take 1 packet by mouth daily.    [provider]  rosuvastatin (CRESTOR) 20 MG tablet Take 20 mg by mouth daily.  01/04/15 07/17/19  [provider]  Vitamin D, Ergocalciferol, (DRISDOL) 1.25 MG (50000 UNIT) CAPS capsule Take 50,000 Units by mouth every 7 (seven) days. Wednesday    [provider]    No family history on file.   Social History   Tobacco Use  . Smoking status: Former Smoker    Quit date: 03/04/1998    Years since quitting: 21.4  . Smokeless tobacco: Never Used  Substance Use Topics  . Alcohol use: Yes    Comment: occasional   . Drug use: No    Allergies as of 08/10/2019 - Review Complete 07/22/2019  Allergen Reaction Noted  . Erythromycin Diarrhea 06/13/2015    Review of Systems:    All systems reviewed and negative except where noted in HPI.   Physical Exam:  There were no vitals taken for this visit. No LMP recorded. Patient is postmenopausal. General:   Alert,  Well-developed, well-nourished, pleasant and cooperative in NAD Head:  Normocephalic and atraumatic. Eyes:  Sclera clear, no icterus.   Conjunctiva pink. Ears:  Normal auditory acuity. Neck:  Supple; no masses or thyromegaly. Lungs:  Respirations even and unlabored.  Clear throughout to auscultation.   No wheezes, crackles, or rhonchi. No acute distress. Heart:  Regular rate and rhythm; no murmurs, clicks, rubs, or gallops. Abdomen:  Normal bowel sounds.  No bruits.  Soft, non-tender and non-distended without masses, hepatosplenomegaly or hernias noted.  No guarding or rebound tenderness.  Negative Carnett sign.   Rectal:  Deferred.  Pulses:  Normal pulses noted. Extremities:  No clubbing or edema.  No cyanosis. Neurologic:  Alert and oriented x3;  grossly normal neurologically. Skin:  Intact without significant lesions or rashes.  No jaundice. Lymph Nodes:  No significant cervical adenopathy. Psych:  Alert and cooperative. Normal mood and affect.  Imaging Studies: DG Chest 1 View  Result Date: 07/17/2019 CLINICAL DATA:  Weakness, fall EXAM: CHEST  1 VIEW COMPARISON:  11/23/2018 FINDINGS: Stable mild cardiomegaly. Atherosclerotic calcification of the aortic knob. Chronic peribronchial thickening and biapical pleuroparenchymal scarring. No focal airspace consolidation, pleural effusion, or pneumothorax. Mildly displaced fractures involving the posterolateral right seventh in a ribs. IMPRESSION: 1. Mildly displaced fractures of the posterolateral right seventh and a right ribs. No pneumothorax. 2. Stable mild cardiomegaly. Electronically Signed    By: Davina Poke D.O.   On: 07/17/2019 19:10   DG Elbow Complete Right  Result Date: 07/17/2019 CLINICAL DATA:  Weakness and fall with rib fractures EXAM: RIGHT ELBOW - COMPLETE 3+ VIEW COMPARISON:  None. FINDINGS: There is no evidence of fracture, dislocation, or joint effusion. There is no evidence of arthropathy or other focal bone abnormality. Soft tissues are unremarkable. IMPRESSION: Negative. Electronically Signed   By: Donavan Foil M.D.   On: 07/17/2019 19:07   CT Head Wo Contrast  Result Date: 07/22/2019 CLINICAL DATA:  Fall with head pain EXAM: CT HEAD WITHOUT CONTRAST CT CERVICAL SPINE WITHOUT CONTRAST TECHNIQUE: Multidetector CT imaging of the head and cervical spine was performed following the standard protocol without intravenous contrast. Multiplanar CT image reconstructions of the cervical spine were also generated. COMPARISON:  Five days ago FINDINGS: CT HEAD FINDINGS Brain:  High-density subdural hematoma along the right cerebral convexity measuring up to 13 mm in thickness. Cortical mass effect is mild. Remote bilateral occipital cortex infarct. No evidence of acute infarct. Chronic small vessel ischemia in the deep cerebral white matter. Vascular: Atherosclerotic calcification. Skull: No acute fracture. Sinuses/Orbits: Left cataract resection.  No evidence of injury. CT CERVICAL SPINE FINDINGS Alignment: No traumatic malalignment. Skull base and vertebrae: No acute fracture. Mild T2 superior endplate concavity that is chronic. Soft tissues and spinal canal: No prevertebral fluid or swelling. No visible canal hematoma. Disc levels:  Ordinary degenerative changes for age. Upper chest: Biapical pleural based scarring.  No evidence of injury Critical Value/emergent results were called by telephone at the time of interpretation on 07/22/2019 at 7:14 am to provider [facility] , who verbally acknowledged these results. IMPRESSION: 1. Acute subdural hematoma along the right cerebral  convexity measuring up to 15 mm in thickness. 2. Negative for cervical spine fracture. Electronically Signed   By: Monte Fantasia M.D.   On: 07/22/2019 07:15   CT Head Wo Contrast  Result Date: 07/17/2019 CLINICAL DATA:  Fall, loss of consciousness, possible head strike EXAM: CT HEAD WITHOUT CONTRAST CT CERVICAL SPINE WITHOUT CONTRAST TECHNIQUE: Multidetector CT imaging of the head and cervical spine was performed following the standard protocol without intravenous contrast. Multiplanar CT image reconstructions of the cervical spine were also generated. COMPARISON:  CT head and cervical spine 04/27/2019 FINDINGS: CT HEAD FINDINGS Brain: Stable regions of encephalomalacia and parenchymal calcification bilateral occipital lobes. Remote lacunar infarcts in the bilateral basal ganglia are noted as well. Senescent basal ganglia mineralization is present. No evidence of acute infarction, hemorrhage, hydrocephalus, extra-axial collection or mass lesion/mass effect. Symmetric prominence of the ventricles, cisterns and sulci compatible with parenchymal volume loss. Patchy areas of white matter hypoattenuation are most compatible with chronic microvascular angiopathy. Vascular: Extensive atherosclerotic calcification of the carotid siphons and intradural vertebral arteries. No hyperdense vessel. Skull: Contusive changes of the left parieto-occipital scalp with small crescentic hematoma measuring up to 3 mm in maximal thickness. No subjacent calvarial fracture. No acute osseous injury or suspicious osseous lesions. Benign hyperostosis frontalis interna is noted incidentally. Sinuses/Orbits: Paranasal sinuses and mastoid air cells are predominantly clear. Insert mastoids middle ear cavities are clear. Orbital structures are unremarkable aside from prior left lens extraction. Other: Moderate bilateral TMJ arthrosis. CT CERVICAL SPINE FINDINGS Alignment: Stabilization collar absent at the time of examination. No evidence of  traumatic listhesis. No abnormally widened, perched or jumped facets. Normal alignment of the craniocervical and atlantoaxial articulations. Skull base and vertebrae: No acute fracture. No primary bone lesion or focal pathologic process. Soft tissues and spinal canal: Mineralization about the dens likely reflect calcium pyrophosphate deposition. No pre or paravertebral fluid or swelling. No visible canal hematoma. Disc levels: Multilevel intervertebral disc height loss with spondylitic endplate changes. Features most pronounced at C5-6 where a small posterior disc osteophyte complex effaces the ventral thecal sac with only mild canal stenosis. Multilevel uncinate spurring and facet hypertrophic changes are noted as well also most pronounced at C5-6 but with only mild to moderate left foraminal narrowing. Upper chest: Extensive biapical pleuroparenchymal scarring is noted. No acute abnormality in the upper chest or imaged lung apices. Other: Extensive atherosclerotic calcification of the thoracic aortic arch and proximal great vessels including the cervical carotids. 1.2 cm hypoattenuating left thyroid nodule. IMPRESSION: CT head: 1. Contusive changes of the left parieto-occipital scalp with scalp hematoma measuring up to 3 mm in maximal thickness. No  subjacent calvarial fracture. 2. No acute intracranial abnormality. 3. Stable regions of encephalomalacia and parenchymal calcification bilateral occipital lobes and remote lacunar infarcts in the bilateral basal ganglia. 4. Moderate parenchymal volume loss and chronic microvascular ischemic changes. CT cervical spine: 1. No acute fracture or traumatic listhesis of the cervical spine. 2. Multilevel degenerative disc disease and facet hypertrophic changes of the cervical spine, most pronounced at C5-6 but with only mild canal stenosis and mild to moderate left foraminal narrowing. 3. 1.2 cm hypoattenuating left thyroid nodule. Per size criteria, no further imaging  evaluation is warranted. This follows consensus guidelines: Managing Incidental Thyroid Nodules Detected on Imaging: White Paper of the ACR Incidental Thyroid Findings Committee. J Am Coll Radiol 2015; 12:143-150. and [facility] 3-tiered system for managing ITNs: J Am Coll Radiol. 2015; Feb;12(2): 143-50 4. Aortic Atherosclerosis (ICD10-I70.0). 5. Cervical and intracranial atherosclerosis. Electronically Signed   By: Lovena Le M.D.   On: 07/17/2019 19:28   CT Cervical Spine Wo Contrast  Result Date: 07/22/2019 CLINICAL DATA:  Fall with head pain EXAM: CT HEAD WITHOUT CONTRAST CT CERVICAL SPINE WITHOUT CONTRAST TECHNIQUE: Multidetector CT imaging of the head and cervical spine was performed following the standard protocol without intravenous contrast. Multiplanar CT image reconstructions of the cervical spine were also generated. COMPARISON:  Five days ago FINDINGS: CT HEAD FINDINGS Brain: High-density subdural hematoma along the right cerebral convexity measuring up to 13 mm in thickness. Cortical mass effect is mild. Remote bilateral occipital cortex infarct. No evidence of acute infarct. Chronic small vessel ischemia in the deep cerebral white matter. Vascular: Atherosclerotic calcification. Skull: No acute fracture. Sinuses/Orbits: Left cataract resection.  No evidence of injury. CT CERVICAL SPINE FINDINGS Alignment: No traumatic malalignment. Skull base and vertebrae: No acute fracture. Mild T2 superior endplate concavity that is chronic. Soft tissues and spinal canal: No prevertebral fluid or swelling. No visible canal hematoma. Disc levels:  Ordinary degenerative changes for age. Upper chest: Biapical pleural based scarring.  No evidence of injury Critical Value/emergent results were called by telephone at the time of interpretation on 07/22/2019 at 7:14 am to provider [facility] , who verbally acknowledged these results. IMPRESSION: 1. Acute subdural hematoma along the right cerebral convexity measuring up  to 15 mm in thickness. 2. Negative for cervical spine fracture. Electronically Signed   By: Monte Fantasia M.D.   On: 07/22/2019 07:15   CT Cervical Spine Wo Contrast  Result Date: 07/17/2019 CLINICAL DATA:  Fall, loss of consciousness, possible head strike EXAM: CT HEAD WITHOUT CONTRAST CT CERVICAL SPINE WITHOUT CONTRAST TECHNIQUE: Multidetector CT imaging of the head and cervical spine was performed following the standard protocol without intravenous contrast. Multiplanar CT image reconstructions of the cervical spine were also generated. COMPARISON:  CT head and cervical spine 04/27/2019 FINDINGS: CT HEAD FINDINGS Brain: Stable regions of encephalomalacia and parenchymal calcification bilateral occipital lobes. Remote lacunar infarcts in the bilateral basal ganglia are noted as well. Senescent basal ganglia mineralization is present. No evidence of acute infarction, hemorrhage, hydrocephalus, extra-axial collection or mass lesion/mass effect. Symmetric prominence of the ventricles, cisterns and sulci compatible with parenchymal volume loss. Patchy areas of white matter hypoattenuation are most compatible with chronic microvascular angiopathy. Vascular: Extensive atherosclerotic calcification of the carotid siphons and intradural vertebral arteries. No hyperdense vessel. Skull: Contusive changes of the left parieto-occipital scalp with small crescentic hematoma measuring up to 3 mm in maximal thickness. No subjacent calvarial fracture. No acute osseous injury or suspicious osseous lesions. Benign hyperostosis frontalis interna is  noted incidentally. Sinuses/Orbits: Paranasal sinuses and mastoid air cells are predominantly clear. Insert mastoids middle ear cavities are clear. Orbital structures are unremarkable aside from prior left lens extraction. Other: Moderate bilateral TMJ arthrosis. CT CERVICAL SPINE FINDINGS Alignment: Stabilization collar absent at the time of examination. No evidence of traumatic  listhesis. No abnormally widened, perched or jumped facets. Normal alignment of the craniocervical and atlantoaxial articulations. Skull base and vertebrae: No acute fracture. No primary bone lesion or focal pathologic process. Soft tissues and spinal canal: Mineralization about the dens likely reflect calcium pyrophosphate deposition. No pre or paravertebral fluid or swelling. No visible canal hematoma. Disc levels: Multilevel intervertebral disc height loss with spondylitic endplate changes. Features most pronounced at C5-6 where a small posterior disc osteophyte complex effaces the ventral thecal sac with only mild canal stenosis. Multilevel uncinate spurring and facet hypertrophic changes are noted as well also most pronounced at C5-6 but with only mild to moderate left foraminal narrowing. Upper chest: Extensive biapical pleuroparenchymal scarring is noted. No acute abnormality in the upper chest or imaged lung apices. Other: Extensive atherosclerotic calcification of the thoracic aortic arch and proximal great vessels including the cervical carotids. 1.2 cm hypoattenuating left thyroid nodule. IMPRESSION: CT head: 1. Contusive changes of the left parieto-occipital scalp with scalp hematoma measuring up to 3 mm in maximal thickness. No subjacent calvarial fracture. 2. No acute intracranial abnormality. 3. Stable regions of encephalomalacia and parenchymal calcification bilateral occipital lobes and remote lacunar infarcts in the bilateral basal ganglia. 4. Moderate parenchymal volume loss and chronic microvascular ischemic changes. CT cervical spine: 1. No acute fracture or traumatic listhesis of the cervical spine. 2. Multilevel degenerative disc disease and facet hypertrophic changes of the cervical spine, most pronounced at C5-6 but with only mild canal stenosis and mild to moderate left foraminal narrowing. 3. 1.2 cm hypoattenuating left thyroid nodule. Per size criteria, no further imaging evaluation is  warranted. This follows consensus guidelines: Managing Incidental Thyroid Nodules Detected on Imaging: White Paper of the ACR Incidental Thyroid Findings Committee. J Am Coll Radiol 2015; 12:143-150. and [facility] 3-tiered system for managing ITNs: J Am Coll Radiol. 2015; Feb;12(2): 143-50 4. Aortic Atherosclerosis (ICD10-I70.0). 5. Cervical and intracranial atherosclerosis. Electronically Signed   By: Lovena Le M.D.   On: 07/17/2019 19:28   DG Hand Complete Right  Result Date: 07/17/2019 CLINICAL DATA:  Weakness and fall EXAM: RIGHT HAND - COMPLETE 3+ VIEW COMPARISON:  None. FINDINGS: Possible subacute to chronic fracture deformity of the fifth proximal phalanx. No subluxation. Joint space narrowing involving the D IP and PIP joints. Mild degenerative change at the first [facility] joint. IMPRESSION: 1. Possible subacute to chronic fracture deformity involving the fifth proximal phalanx. 2. Arthritis within the digits. Electronically Signed   By: Donavan Foil M.D.   On: 07/17/2019 19:10    Assessment and Plan:   Carrie Pratt is a 86 y.o. y/o female who comes in today with 2 separate imaging studies one done in February and one in May that showed a cecal mass.  The patient is apprehensive whether she would want any invasive treatment or procedures at this time.  After much discussion with the patient and her son it was agreed upon that we will do a CEA and if positive and suggestive of colon cancer than they would more strongly consider undergoing investigative work-up.  The patient and her son have been explained the plan and will be contacted with the results.    Lucilla Lame, MD.  FACG    Note: This dictation was prepared with Dragon dictation along with smaller phrase technology. Any transcriptional errors that result from this process are unintentional.

## 2019-08-11 ENCOUNTER — Telehealth: Payer: Self-pay

## 2019-08-11 ENCOUNTER — Other Ambulatory Visit: Payer: Self-pay

## 2019-08-11 LAB — CEA: CEA: 2.3 ng/mL (ref 0.0–4.7)

## 2019-08-11 NOTE — Telephone Encounter (Signed)
Pt's son notified of lab results. 

## 2019-08-11 NOTE — Telephone Encounter (Signed)
-----   Message from Lucilla Lame, MD sent at 08/11/2019  7:17 AM EDT ----- Please let the patient's son know that the tumor marker was negative which does not rule out the mass being a cancer but now gives [place] more information to make a decision whether she would like to proceed with a colonoscopy or not.  If she does decide to have a colonoscopy please contact our office and we can set that up.

## 2019-08-12 DIAGNOSIS — S065X0A Traumatic subdural hemorrhage without loss of consciousness, initial encounter: Secondary | ICD-10-CM | POA: Diagnosis not present

## 2019-08-12 DIAGNOSIS — D120 Benign neoplasm of cecum: Secondary | ICD-10-CM | POA: Diagnosis not present

## 2019-08-12 DIAGNOSIS — E1159 Type 2 diabetes mellitus with other circulatory complications: Secondary | ICD-10-CM | POA: Diagnosis not present

## 2019-08-12 DIAGNOSIS — I10 Essential (primary) hypertension: Secondary | ICD-10-CM | POA: Diagnosis not present

## 2019-08-13 ENCOUNTER — Other Ambulatory Visit: Payer: Self-pay

## 2019-08-13 ENCOUNTER — Emergency Department
Admission: EM | Admit: 2019-08-13 | Discharge: 2019-08-14 | Disposition: A | Discharge status: Other Acute Inpatient Hospital | Payer: Medicare Other | Attending: Emergency Medicine | Admitting: Emergency Medicine

## 2019-08-13 ENCOUNTER — Emergency Department: Payer: Medicare Other

## 2019-08-13 ENCOUNTER — Encounter: Payer: Self-pay | Admitting: Emergency Medicine

## 2019-08-13 DIAGNOSIS — X58XXXA Exposure to other specified factors, initial encounter: Secondary | ICD-10-CM | POA: Insufficient documentation

## 2019-08-13 DIAGNOSIS — S065X9A Traumatic subdural hemorrhage with loss of consciousness of unspecified duration, initial encounter: Secondary | ICD-10-CM | POA: Insufficient documentation

## 2019-08-13 DIAGNOSIS — R519 Headache, unspecified: Secondary | ICD-10-CM | POA: Diagnosis not present

## 2019-08-13 DIAGNOSIS — Z7984 Long term (current) use of oral hypoglycemic drugs: Secondary | ICD-10-CM | POA: Insufficient documentation

## 2019-08-13 DIAGNOSIS — I10 Essential (primary) hypertension: Secondary | ICD-10-CM | POA: Insufficient documentation

## 2019-08-13 DIAGNOSIS — E119 Type 2 diabetes mellitus without complications: Secondary | ICD-10-CM | POA: Insufficient documentation

## 2019-08-13 DIAGNOSIS — Z7901 Long term (current) use of anticoagulants: Secondary | ICD-10-CM | POA: Diagnosis not present

## 2019-08-13 DIAGNOSIS — R4182 Altered mental status, unspecified: Secondary | ICD-10-CM | POA: Diagnosis not present

## 2019-08-13 DIAGNOSIS — Y999 Unspecified external cause status: Secondary | ICD-10-CM | POA: Insufficient documentation

## 2019-08-13 DIAGNOSIS — R531 Weakness: Secondary | ICD-10-CM

## 2019-08-13 DIAGNOSIS — Z20822 Contact with and (suspected) exposure to covid-19: Secondary | ICD-10-CM | POA: Diagnosis not present

## 2019-08-13 DIAGNOSIS — S065XAA Traumatic subdural hemorrhage with loss of consciousness status unknown, initial encounter: Secondary | ICD-10-CM

## 2019-08-13 DIAGNOSIS — Y92129 Unspecified place in nursing home as the place of occurrence of the external cause: Secondary | ICD-10-CM | POA: Insufficient documentation

## 2019-08-13 DIAGNOSIS — Y9389 Activity, other specified: Secondary | ICD-10-CM | POA: Insufficient documentation

## 2019-08-13 DIAGNOSIS — Z79899 Other long term (current) drug therapy: Secondary | ICD-10-CM | POA: Diagnosis not present

## 2019-08-13 DIAGNOSIS — R404 Transient alteration of awareness: Secondary | ICD-10-CM | POA: Insufficient documentation

## 2019-08-13 LAB — BASIC METABOLIC PANEL
Anion gap: 10 (ref 5–15)
BUN: 17 mg/dL (ref 8–23)
CO2: 27 mmol/L (ref 22–32)
Calcium: 9.2 mg/dL (ref 8.9–10.3)
Chloride: 99 mmol/L (ref 98–111)
Creatinine, Ser: 0.96 mg/dL (ref 0.44–1.00)
GFR calc Af Amer: 59 mL/min — ABNORMAL LOW (ref >60)
GFR calc non Af Amer: 51 mL/min — ABNORMAL LOW (ref >60)
Glucose, Bld: 146 mg/dL — ABNORMAL HIGH (ref 70–99)
Potassium: 4 mmol/L (ref 3.5–5.1)
Sodium: 136 mmol/L (ref 135–145)

## 2019-08-13 LAB — SARS CORONAVIRUS 2 BY RT PCR (HOSPITAL ORDER, PERFORMED IN ~~LOC~~ HOSPITAL LAB): SARS Coronavirus 2: NEGATIVE

## 2019-08-13 LAB — CBC
HCT: 39.8 % (ref 36.0–46.0)
Hemoglobin: 13.1 g/dL (ref 12.0–15.0)
MCH: 28.8 pg (ref 26.0–34.0)
MCHC: 32.9 g/dL (ref 30.0–36.0)
MCV: 87.5 fL (ref 80.0–100.0)
Platelets: 351 10*3/uL (ref 150–400)
RBC: 4.55 MIL/uL (ref 3.87–5.11)
RDW: 13.8 % (ref 11.5–15.5)
WBC: 11.3 10*3/uL — ABNORMAL HIGH (ref 4.0–10.5)
nRBC: 0 % (ref 0.0–0.2)

## 2019-08-13 LAB — TROPONIN I (HIGH SENSITIVITY): Troponin I (High Sensitivity): 4 ng/L (ref <18)

## 2019-08-13 NOTE — ED Triage Notes (Signed)
Pt arrival via [facility] from [facility] due to increased weakness. EMS states that the facility wanted her to be seen due to what they think has been TIA's leading to her having increased weakness and drooling. They also state that patient has been more lethargic than baseline. On arrival, patient is A&Ox3 and has equal moderate grip strength. Stroke screen neg with EMS. CBG 150 with EMS, BP 13050, HR 90.  Pt a&ox4, NAD noted. Patient states she has a headache.

## 2019-08-13 NOTE — ED Provider Notes (Signed)
[facility] Emergency Department Provider Note   ____________________________________________   I have reviewed the triage vital signs and the nursing notes.   HISTORY  Chief Complaint Weakness   History limited by and level 5 caveat due to: weakness, AMS   HPI Carrie Pratt is a 86 y.o. female who presents to the emergency department today from living facility because of concerns for increased weakness and some change in her responsiveness.  They also had some con concern for some drooling and facial weakness.  On exam patient is somewhat somnolent.  She will wake up with verbal stimuli however cannot answer many questions.  Patient not give any history as to why she is here in the emergency department.  She does however deny any pain.   Records reviewed. Per medical record review patient has a history of subdural hematoma.  Past Medical History:  Diagnosis Date  . Asthma    as a child  . Bell's palsy   . Diverticulosis   . Environmental and seasonal allergies   . Hyperlipidemia   . Hypertension   . IBS (irritable bowel syndrome)   . Lower extremity edema   . Macular degeneration, wet ([place])   . Migraines   . Osteoporosis   . TIA (transient ischemic attack)   . Type 2 diabetes mellitus with polyneuropathy [facility])     Patient Active Problem List   Diagnosis Date Noted  . Subdural hematoma ([place]) 07/23/2019  . Atherosclerosis of aorta ([place]) 04/15/2019  . OAB (overactive bladder) 02/04/2019  . Osteoporosis 07/01/2018  . Bleeding internal hemorrhoids   . Advance directive discussed with patient 03/05/2018  . Type 2 diabetes mellitus with polyneuropathy ([place])   . Hypertension   . Hyperlipidemia   . Environmental and seasonal allergies   . Macular degeneration, wet ([place])   . TIA (transient ischemic attack)   . Benign paroxysmal positional vertigo of left ear 11/01/2014  . Senile osteoporosis 11/26/2013    Past Surgical History:  Procedure  Laterality Date  . CATARACT EXTRACTION W/PHACO Left 05/29/2016   Procedure: CATARACT EXTRACTION PHACO AND INTRAOCULAR LENS PLACEMENT (IOC);  Surgeon: Estill Cotta, MD;  Location: [facility] ORS;  Service: Ophthalmology;  Laterality: Left;  [place]: 01:48.8 AP% 25.2 CDE: 50.41 [id number]  . CHOLECYSTECTOMY    . COLONOSCOPY    . OOPHORECTOMY    . TONSILLECTOMY      Prior to Admission medications   Medication Sig Start Date End Date Taking? Authorizing Provider  acetaminophen (TYLENOL) 650 MG CR tablet Take 650 mg by mouth 2 (two) times daily.    [provider]  albuterol (VENTOLIN HFA) 108 (90 Base) MCG/ACT inhaler Inhale into the lungs every 6 (six) hours as needed for wheezing or shortness of breath.    [provider]  cetirizine (ZYRTEC) 10 MG tablet Take 10 mg by mouth daily.    [provider]  clopidogrel (PLAVIX) 75 MG tablet Take 75 mg by mouth daily.  03/14/15   [provider]  Cranberry-Vitamin C (AZO CRANBERRY URINARY TRACT) 250-60 MG CAPS Take 2 capsules by mouth at bedtime.    [provider]  glimepiride (AMARYL) 4 MG tablet Take 2 mg by mouth daily with breakfast.    [provider]  hydrocortisone (PROCTOZONE-HC) 2.5 % rectal cream Place 1 application rectally at bedtime.    [provider]  Lidocaine HCl (ASPERCREME LIDOCAINE) 4 % CREA Apply topically every 4 (four) hours as needed (apply to right shoulder topically every  4 hours as needed for pain).    [provider]  lisinopril (ZESTRIL) 5 MG tablet Take 5 mg by mouth daily.    [provider]  metFORMIN (GLUCOPHAGE-XR) 500 MG 24 hr tablet Take 1,000 mg by mouth daily at 6 PM. at    [provider]  montelukast (SINGULAIR) 10 MG tablet Take 10 mg by mouth at bedtime.    [provider]  oxybutynin (DITROPAN) 5 MG tablet Take 5 mg by mouth at bedtime.    [provider]  polyethylene glycol (MIRALAX / GLYCOLAX) packet  Take 17 g by mouth daily. Patient not taking: Reported on 08/10/2019 04/04/18   Epifanio Lesches, MD  psyllium (METAMUCIL) 58.6 % powder Take 1 packet by mouth daily.    [provider]  rosuvastatin (CRESTOR) 20 MG tablet Take 20 mg by mouth daily.  01/04/15 07/17/19  [provider]  sennosides-docusate sodium (SENOKOT-S) 8.6-50 MG tablet Take 1 tablet by mouth daily.    [provider]  Vitamin D, Ergocalciferol, (DRISDOL) 1.25 MG (50000 UNIT) CAPS capsule Take 50,000 Units by mouth every 7 (seven) days. Wednesday    [provider]    Allergies Erythromycin  History reviewed. No pertinent family history.  Social History Social History   Tobacco Use  . Smoking status: Former Smoker    Quit date: 03/04/1998    Years since quitting: 21.4  . Smokeless tobacco: Never Used  Substance Use Topics  . Alcohol use: Yes    Comment: occasional  . Drug use: No    Review of Systems Unable to obtain reliable ROS secondary to medical condition. ____________________________________________   PHYSICAL EXAM:  VITAL SIGNS: ED Triage Vitals  Enc Vitals Group     BP 08/13/19 1904 (!) 114/54     Pulse Rate 08/13/19 1904 85     Resp 08/13/19 1904 (!) 22     Temp 08/13/19 1904 98.4 F (36.9 C)     Temp Source 08/13/19 1904 Oral     SpO2 08/13/19 1904 96 %     Weight 08/13/19 1906 137 lb (62.1 kg)     Height 08/13/19 1906 5\' 2"  (1.575 m)     Head Circumference --      Peak Flow --      Pain Score 08/13/19 1906 5   Constitutional: Awake and alert.  Eyes: Conjunctivae are normal.  ENT      Head: Normocephalic and atraumatic.      Nose: No congestion/rhinnorhea.      Mouth/Throat: Mucous membranes are moist.      Neck: No stridor. Hematological/Lymphatic/Immunilogical: No cervical lymphadenopathy. Cardiovascular: Normal rate, regular rhythm.  No murmurs, rubs, or gallops. Respiratory: Normal respiratory effort without tachypnea nor retractions. Breath  sounds are clear and equal bilaterally. No wheezes/rales/rhonchi. Gastrointestinal: Soft and non tender. Slightly distended.  Genitourinary: Deferred Musculoskeletal: Normal range of motion in all extremities. No lower extremity edema. Neurologic:  Somnolent. Awakens to verbal stimuli. Not oriented. Appears to move all extremities. No appreciable facial droop. Skin:  Skin is warm, dry and intact. No rash noted. Psychiatric: Mood and affect are normal. Speech and behavior are normal. Patient exhibits appropriate insight and judgment.  ____________________________________________    LABS (pertinent positives/negatives)  CBC wbc 11.3, hgb 13.1, plt 351 Trop hs 4 COVID neg BMP wnl except glu 146  ____________________________________________   EKG  I, Nance Pear, attending physician, personally viewed and interpreted this EKG  EKG Time: 1909 Rate: 83 Rhythm: sinus rhythm  Axis: normal Intervals: qtc 454 QRS: narrow, q waves III ST changes: no st elevation Impression: abnormal ekg   ____________________________________________    RADIOLOGY  CT head Enlarged subdural hematoma with 9 mm midline shift  DG acute abd Non obstructed gas bowel pattern  ____________________________________________   PROCEDURES  Procedures  CRITICAL CARE Performed by: Nance Pear   Total critical care time: 30 minutes  Critical care time was exclusive of separately billable procedures and treating other patients.  Critical care was necessary to treat or prevent imminent or life-threatening deterioration.  Critical care was time spent personally by me on the following activities: development of treatment plan with patient and/or surrogate as well as nursing, discussions with consultants, evaluation of patient's response to treatment, examination of patient, obtaining history from patient or surrogate, ordering and performing treatments and interventions, ordering and review of  laboratory studies, ordering and review of radiographic studies, pulse oximetry and re-evaluation of patient's condition.  ____________________________________________   INITIAL IMPRESSION / ASSESSMENT AND PLAN / ED COURSE  Pertinent labs & imaging results that were available during my care of the patient were reviewed by me and considered in my medical decision making (see chart for details).   Patient presented from her living facility today because of concerns for increased weakness and possibly some facial droop.  On my exam patient is somewhat somnolent.  She will awaken up to verbal stimuli.  Patient did have history of subdural hematoma.  CT head here for site shows a large subdural now with midline shift.  Did discuss with patient's son.  Will plan on transferring to [facility] for further work-up and management.   ____________________________________________   FINAL CLINICAL IMPRESSION(S) / ED DIAGNOSES  Final diagnoses:  Weakness  Subdural hematoma ([place])     Note: This dictation was prepared with Dragon dictation. Any transcriptional errors that result from this process are unintentional     Nance Pear, MD 08/13/19 2334

## 2019-08-14 DIAGNOSIS — R296 Repeated falls: Secondary | ICD-10-CM | POA: Diagnosis not present

## 2019-08-14 DIAGNOSIS — Z9049 Acquired absence of other specified parts of digestive tract: Secondary | ICD-10-CM | POA: Diagnosis not present

## 2019-08-14 DIAGNOSIS — R4781 Slurred speech: Secondary | ICD-10-CM | POA: Diagnosis not present

## 2019-08-14 DIAGNOSIS — J4521 Mild intermittent asthma with (acute) exacerbation: Secondary | ICD-10-CM | POA: Diagnosis not present

## 2019-08-14 DIAGNOSIS — D649 Anemia, unspecified: Secondary | ICD-10-CM | POA: Diagnosis not present

## 2019-08-14 DIAGNOSIS — I6203 Nontraumatic chronic subdural hemorrhage: Secondary | ICD-10-CM | POA: Diagnosis present

## 2019-08-14 DIAGNOSIS — R4189 Other symptoms and signs involving cognitive functions and awareness: Secondary | ICD-10-CM | POA: Diagnosis not present

## 2019-08-14 DIAGNOSIS — R278 Other lack of coordination: Secondary | ICD-10-CM | POA: Diagnosis not present

## 2019-08-14 DIAGNOSIS — Z9181 History of falling: Secondary | ICD-10-CM | POA: Diagnosis not present

## 2019-08-14 DIAGNOSIS — Z87891 Personal history of nicotine dependence: Secondary | ICD-10-CM | POA: Diagnosis not present

## 2019-08-14 DIAGNOSIS — S065X0A Traumatic subdural hemorrhage without loss of consciousness, initial encounter: Secondary | ICD-10-CM | POA: Diagnosis not present

## 2019-08-14 DIAGNOSIS — S065X9D Traumatic subdural hemorrhage with loss of consciousness of unspecified duration, subsequent encounter: Secondary | ICD-10-CM | POA: Diagnosis not present

## 2019-08-14 DIAGNOSIS — S065X0D Traumatic subdural hemorrhage without loss of consciousness, subsequent encounter: Secondary | ICD-10-CM | POA: Diagnosis not present

## 2019-08-14 DIAGNOSIS — H8110 Benign paroxysmal vertigo, unspecified ear: Secondary | ICD-10-CM | POA: Diagnosis not present

## 2019-08-14 DIAGNOSIS — R569 Unspecified convulsions: Secondary | ICD-10-CM | POA: Diagnosis not present

## 2019-08-14 DIAGNOSIS — W19XXXD Unspecified fall, subsequent encounter: Secondary | ICD-10-CM | POA: Diagnosis not present

## 2019-08-14 DIAGNOSIS — Z8673 Personal history of transient ischemic attack (TIA), and cerebral infarction without residual deficits: Secondary | ICD-10-CM | POA: Diagnosis not present

## 2019-08-14 DIAGNOSIS — R1311 Dysphagia, oral phase: Secondary | ICD-10-CM | POA: Diagnosis not present

## 2019-08-14 DIAGNOSIS — I6200 Nontraumatic subdural hemorrhage, unspecified: Secondary | ICD-10-CM | POA: Diagnosis not present

## 2019-08-14 DIAGNOSIS — J4520 Mild intermittent asthma, uncomplicated: Secondary | ICD-10-CM | POA: Diagnosis present

## 2019-08-14 DIAGNOSIS — Y33XXXA Other specified events, undetermined intent, initial encounter: Secondary | ICD-10-CM | POA: Diagnosis not present

## 2019-08-14 DIAGNOSIS — J45909 Unspecified asthma, uncomplicated: Secondary | ICD-10-CM | POA: Diagnosis not present

## 2019-08-14 DIAGNOSIS — G9389 Other specified disorders of brain: Secondary | ICD-10-CM | POA: Diagnosis not present

## 2019-08-14 DIAGNOSIS — R29810 Facial weakness: Secondary | ICD-10-CM | POA: Diagnosis not present

## 2019-08-14 DIAGNOSIS — M6281 Muscle weakness (generalized): Secondary | ICD-10-CM | POA: Diagnosis not present

## 2019-08-14 DIAGNOSIS — I10 Essential (primary) hypertension: Secondary | ICD-10-CM | POA: Diagnosis present

## 2019-08-14 DIAGNOSIS — M1990 Unspecified osteoarthritis, unspecified site: Secondary | ICD-10-CM | POA: Diagnosis not present

## 2019-08-14 DIAGNOSIS — Z20822 Contact with and (suspected) exposure to covid-19: Secondary | ICD-10-CM | POA: Diagnosis present

## 2019-08-14 DIAGNOSIS — E119 Type 2 diabetes mellitus without complications: Secondary | ICD-10-CM | POA: Diagnosis present

## 2019-08-14 DIAGNOSIS — Z741 Need for assistance with personal care: Secondary | ICD-10-CM | POA: Diagnosis not present

## 2019-08-14 DIAGNOSIS — M810 Age-related osteoporosis without current pathological fracture: Secondary | ICD-10-CM | POA: Diagnosis present

## 2019-08-14 DIAGNOSIS — R2689 Other abnormalities of gait and mobility: Secondary | ICD-10-CM | POA: Diagnosis not present

## 2019-08-14 DIAGNOSIS — E785 Hyperlipidemia, unspecified: Secondary | ICD-10-CM | POA: Diagnosis present

## 2019-08-14 DIAGNOSIS — M5412 Radiculopathy, cervical region: Secondary | ICD-10-CM | POA: Diagnosis not present

## 2019-08-14 DIAGNOSIS — S065X9A Traumatic subdural hemorrhage with loss of consciousness of unspecified duration, initial encounter: Secondary | ICD-10-CM | POA: Diagnosis not present

## 2019-08-14 DIAGNOSIS — W1839XA Other fall on same level, initial encounter: Secondary | ICD-10-CM | POA: Diagnosis not present

## 2019-08-14 DIAGNOSIS — D72829 Elevated white blood cell count, unspecified: Secondary | ICD-10-CM | POA: Diagnosis not present

## 2019-08-14 NOTE — ED Notes (Signed)
Pt's glasses only possession not on pt given to EMS with transfer paperwork

## 2019-08-14 NOTE — ED Notes (Signed)
Nelva Nay, RN charge at [facility] called to notify of pt coming via [facility]

## 2019-08-14 NOTE — ED Notes (Signed)
PUR wic removed, pt with zero urine output  Pt moved to EMS stretcher att

## 2019-08-17 DIAGNOSIS — R2689 Other abnormalities of gait and mobility: Secondary | ICD-10-CM | POA: Diagnosis not present

## 2019-08-17 DIAGNOSIS — R4189 Other symptoms and signs involving cognitive functions and awareness: Secondary | ICD-10-CM | POA: Diagnosis not present

## 2019-08-17 DIAGNOSIS — D649 Anemia, unspecified: Secondary | ICD-10-CM | POA: Diagnosis not present

## 2019-08-17 DIAGNOSIS — R29810 Facial weakness: Secondary | ICD-10-CM | POA: Diagnosis not present

## 2019-08-17 DIAGNOSIS — Z741 Need for assistance with personal care: Secondary | ICD-10-CM | POA: Diagnosis not present

## 2019-08-17 DIAGNOSIS — M1990 Unspecified osteoarthritis, unspecified site: Secondary | ICD-10-CM | POA: Diagnosis not present

## 2019-08-17 DIAGNOSIS — J4521 Mild intermittent asthma with (acute) exacerbation: Secondary | ICD-10-CM | POA: Diagnosis not present

## 2019-08-17 DIAGNOSIS — I6203 Nontraumatic chronic subdural hemorrhage: Secondary | ICD-10-CM | POA: Diagnosis not present

## 2019-08-17 DIAGNOSIS — S065X0A Traumatic subdural hemorrhage without loss of consciousness, initial encounter: Secondary | ICD-10-CM | POA: Diagnosis not present

## 2019-08-17 DIAGNOSIS — W19XXXD Unspecified fall, subsequent encounter: Secondary | ICD-10-CM | POA: Diagnosis not present

## 2019-08-17 DIAGNOSIS — E119 Type 2 diabetes mellitus without complications: Secondary | ICD-10-CM | POA: Diagnosis not present

## 2019-08-17 DIAGNOSIS — M810 Age-related osteoporosis without current pathological fracture: Secondary | ICD-10-CM | POA: Diagnosis not present

## 2019-08-17 DIAGNOSIS — Z8673 Personal history of transient ischemic attack (TIA), and cerebral infarction without residual deficits: Secondary | ICD-10-CM | POA: Diagnosis not present

## 2019-08-17 DIAGNOSIS — E785 Hyperlipidemia, unspecified: Secondary | ICD-10-CM | POA: Diagnosis not present

## 2019-08-17 DIAGNOSIS — S065X9A Traumatic subdural hemorrhage with loss of consciousness of unspecified duration, initial encounter: Secondary | ICD-10-CM | POA: Diagnosis not present

## 2019-08-17 DIAGNOSIS — S065X0D Traumatic subdural hemorrhage without loss of consciousness, subsequent encounter: Secondary | ICD-10-CM | POA: Diagnosis not present

## 2019-08-17 DIAGNOSIS — M6281 Muscle weakness (generalized): Secondary | ICD-10-CM | POA: Diagnosis not present

## 2019-08-17 DIAGNOSIS — R296 Repeated falls: Secondary | ICD-10-CM | POA: Diagnosis not present

## 2019-08-17 DIAGNOSIS — S065X9D Traumatic subdural hemorrhage with loss of consciousness of unspecified duration, subsequent encounter: Secondary | ICD-10-CM | POA: Diagnosis not present

## 2019-08-17 DIAGNOSIS — J4520 Mild intermittent asthma, uncomplicated: Secondary | ICD-10-CM | POA: Diagnosis not present

## 2019-08-17 DIAGNOSIS — R278 Other lack of coordination: Secondary | ICD-10-CM | POA: Diagnosis not present

## 2019-08-17 DIAGNOSIS — R1311 Dysphagia, oral phase: Secondary | ICD-10-CM | POA: Diagnosis not present

## 2019-08-17 DIAGNOSIS — I10 Essential (primary) hypertension: Secondary | ICD-10-CM | POA: Diagnosis not present

## 2019-08-17 DIAGNOSIS — H8110 Benign paroxysmal vertigo, unspecified ear: Secondary | ICD-10-CM | POA: Diagnosis not present

## 2019-09-13 LAB — HM DIABETES EYE EXAM

## 2019-09-22 DIAGNOSIS — I10 Essential (primary) hypertension: Secondary | ICD-10-CM | POA: Diagnosis not present

## 2019-09-22 DIAGNOSIS — E1159 Type 2 diabetes mellitus with other circulatory complications: Secondary | ICD-10-CM | POA: Diagnosis not present

## 2019-09-22 DIAGNOSIS — G609 Hereditary and idiopathic neuropathy, unspecified: Secondary | ICD-10-CM | POA: Diagnosis not present

## 2019-09-22 DIAGNOSIS — S065X0A Traumatic subdural hemorrhage without loss of consciousness, initial encounter: Secondary | ICD-10-CM | POA: Diagnosis not present

## 2019-09-22 DIAGNOSIS — D120 Benign neoplasm of cecum: Secondary | ICD-10-CM | POA: Diagnosis not present

## 2019-10-18 DIAGNOSIS — M79644 Pain in right finger(s): Secondary | ICD-10-CM | POA: Diagnosis not present

## 2019-10-18 DIAGNOSIS — E1142 Type 2 diabetes mellitus with diabetic polyneuropathy: Secondary | ICD-10-CM | POA: Diagnosis not present

## 2019-10-18 DIAGNOSIS — S60121A Contusion of right index finger with damage to nail, initial encounter: Secondary | ICD-10-CM | POA: Diagnosis not present

## 2019-10-18 DIAGNOSIS — S065X0A Traumatic subdural hemorrhage without loss of consciousness, initial encounter: Secondary | ICD-10-CM | POA: Diagnosis not present

## 2019-10-18 DIAGNOSIS — I10 Essential (primary) hypertension: Secondary | ICD-10-CM | POA: Diagnosis not present

## 2019-10-20 DIAGNOSIS — H814 Vertigo of central origin: Secondary | ICD-10-CM | POA: Diagnosis not present

## 2019-10-22 DIAGNOSIS — B351 Tinea unguium: Secondary | ICD-10-CM | POA: Diagnosis not present

## 2019-10-25 DIAGNOSIS — S065X9D Traumatic subdural hemorrhage with loss of consciousness of unspecified duration, subsequent encounter: Secondary | ICD-10-CM | POA: Diagnosis not present

## 2019-10-25 DIAGNOSIS — R278 Other lack of coordination: Secondary | ICD-10-CM | POA: Diagnosis not present

## 2019-10-25 DIAGNOSIS — I10 Essential (primary) hypertension: Secondary | ICD-10-CM | POA: Diagnosis not present

## 2019-10-25 DIAGNOSIS — D649 Anemia, unspecified: Secondary | ICD-10-CM | POA: Diagnosis not present

## 2019-10-25 DIAGNOSIS — W19XXXD Unspecified fall, subsequent encounter: Secondary | ICD-10-CM | POA: Diagnosis not present

## 2019-10-25 DIAGNOSIS — R1311 Dysphagia, oral phase: Secondary | ICD-10-CM | POA: Diagnosis not present

## 2019-10-25 DIAGNOSIS — M6281 Muscle weakness (generalized): Secondary | ICD-10-CM | POA: Diagnosis not present

## 2019-10-25 DIAGNOSIS — R42 Dizziness and giddiness: Secondary | ICD-10-CM | POA: Diagnosis not present

## 2019-10-25 DIAGNOSIS — E785 Hyperlipidemia, unspecified: Secondary | ICD-10-CM | POA: Diagnosis not present

## 2019-10-25 DIAGNOSIS — R4189 Other symptoms and signs involving cognitive functions and awareness: Secondary | ICD-10-CM | POA: Diagnosis not present

## 2019-10-25 DIAGNOSIS — S065X0D Traumatic subdural hemorrhage without loss of consciousness, subsequent encounter: Secondary | ICD-10-CM | POA: Diagnosis not present

## 2019-10-25 DIAGNOSIS — H8110 Benign paroxysmal vertigo, unspecified ear: Secondary | ICD-10-CM | POA: Diagnosis not present

## 2019-10-25 DIAGNOSIS — M1990 Unspecified osteoarthritis, unspecified site: Secondary | ICD-10-CM | POA: Diagnosis not present

## 2019-10-25 DIAGNOSIS — E119 Type 2 diabetes mellitus without complications: Secondary | ICD-10-CM | POA: Diagnosis not present

## 2019-10-25 DIAGNOSIS — Z8673 Personal history of transient ischemic attack (TIA), and cerebral infarction without residual deficits: Secondary | ICD-10-CM | POA: Diagnosis not present

## 2019-10-25 DIAGNOSIS — Z741 Need for assistance with personal care: Secondary | ICD-10-CM | POA: Diagnosis not present

## 2019-10-25 DIAGNOSIS — M810 Age-related osteoporosis without current pathological fracture: Secondary | ICD-10-CM | POA: Diagnosis not present

## 2019-12-29 DIAGNOSIS — S065X0A Traumatic subdural hemorrhage without loss of consciousness, initial encounter: Secondary | ICD-10-CM | POA: Diagnosis not present

## 2019-12-29 DIAGNOSIS — I700 Atherosclerosis of aorta: Secondary | ICD-10-CM | POA: Diagnosis not present

## 2019-12-29 DIAGNOSIS — B351 Tinea unguium: Secondary | ICD-10-CM | POA: Diagnosis not present

## 2019-12-29 DIAGNOSIS — C180 Malignant neoplasm of cecum: Secondary | ICD-10-CM | POA: Diagnosis not present

## 2019-12-29 DIAGNOSIS — E1140 Type 2 diabetes mellitus with diabetic neuropathy, unspecified: Secondary | ICD-10-CM | POA: Diagnosis not present

## 2019-12-29 DIAGNOSIS — H353290 Exudative age-related macular degeneration, unspecified eye, stage unspecified: Secondary | ICD-10-CM | POA: Diagnosis not present

## 2019-12-29 DIAGNOSIS — I69354 Hemiplegia and hemiparesis following cerebral infarction affecting left non-dominant side: Secondary | ICD-10-CM | POA: Diagnosis not present

## 2019-12-29 DIAGNOSIS — I10 Essential (primary) hypertension: Secondary | ICD-10-CM | POA: Diagnosis not present

## 2020-01-04 DIAGNOSIS — Z8673 Personal history of transient ischemic attack (TIA), and cerebral infarction without residual deficits: Secondary | ICD-10-CM | POA: Diagnosis not present

## 2020-01-04 DIAGNOSIS — F39 Unspecified mood [affective] disorder: Secondary | ICD-10-CM | POA: Diagnosis not present

## 2020-01-04 DIAGNOSIS — E1142 Type 2 diabetes mellitus with diabetic polyneuropathy: Secondary | ICD-10-CM | POA: Diagnosis not present

## 2020-01-04 DIAGNOSIS — I700 Atherosclerosis of aorta: Secondary | ICD-10-CM | POA: Diagnosis not present

## 2020-01-04 DIAGNOSIS — Z87820 Personal history of traumatic brain injury: Secondary | ICD-10-CM | POA: Diagnosis not present

## 2020-01-07 DIAGNOSIS — B351 Tinea unguium: Secondary | ICD-10-CM | POA: Diagnosis not present

## 2020-01-13 DIAGNOSIS — R42 Dizziness and giddiness: Secondary | ICD-10-CM | POA: Diagnosis not present

## 2020-01-14 DIAGNOSIS — S065X0D Traumatic subdural hemorrhage without loss of consciousness, subsequent encounter: Secondary | ICD-10-CM | POA: Diagnosis not present

## 2020-01-14 DIAGNOSIS — H8110 Benign paroxysmal vertigo, unspecified ear: Secondary | ICD-10-CM | POA: Diagnosis not present

## 2020-01-14 DIAGNOSIS — R2681 Unsteadiness on feet: Secondary | ICD-10-CM | POA: Diagnosis not present

## 2020-01-14 DIAGNOSIS — R1311 Dysphagia, oral phase: Secondary | ICD-10-CM | POA: Diagnosis not present

## 2020-01-14 DIAGNOSIS — I10 Essential (primary) hypertension: Secondary | ICD-10-CM | POA: Diagnosis not present

## 2020-01-14 DIAGNOSIS — E785 Hyperlipidemia, unspecified: Secondary | ICD-10-CM | POA: Diagnosis not present

## 2020-01-14 DIAGNOSIS — Z741 Need for assistance with personal care: Secondary | ICD-10-CM | POA: Diagnosis not present

## 2020-01-14 DIAGNOSIS — R42 Dizziness and giddiness: Secondary | ICD-10-CM | POA: Diagnosis not present

## 2020-01-14 DIAGNOSIS — M1990 Unspecified osteoarthritis, unspecified site: Secondary | ICD-10-CM | POA: Diagnosis not present

## 2020-01-14 DIAGNOSIS — E119 Type 2 diabetes mellitus without complications: Secondary | ICD-10-CM | POA: Diagnosis not present

## 2020-01-14 DIAGNOSIS — M810 Age-related osteoporosis without current pathological fracture: Secondary | ICD-10-CM | POA: Diagnosis not present

## 2020-01-14 DIAGNOSIS — W19XXXD Unspecified fall, subsequent encounter: Secondary | ICD-10-CM | POA: Diagnosis not present

## 2020-01-14 DIAGNOSIS — R278 Other lack of coordination: Secondary | ICD-10-CM | POA: Diagnosis not present

## 2020-01-14 DIAGNOSIS — M6281 Muscle weakness (generalized): Secondary | ICD-10-CM | POA: Diagnosis not present

## 2020-01-14 DIAGNOSIS — Z8673 Personal history of transient ischemic attack (TIA), and cerebral infarction without residual deficits: Secondary | ICD-10-CM | POA: Diagnosis not present

## 2020-01-14 DIAGNOSIS — R262 Difficulty in walking, not elsewhere classified: Secondary | ICD-10-CM | POA: Diagnosis not present

## 2020-01-14 DIAGNOSIS — D649 Anemia, unspecified: Secondary | ICD-10-CM | POA: Diagnosis not present

## 2020-01-14 DIAGNOSIS — S065X9D Traumatic subdural hemorrhage with loss of consciousness of unspecified duration, subsequent encounter: Secondary | ICD-10-CM | POA: Diagnosis not present

## 2020-01-14 DIAGNOSIS — J4521 Mild intermittent asthma with (acute) exacerbation: Secondary | ICD-10-CM | POA: Diagnosis not present

## 2020-01-14 DIAGNOSIS — R4189 Other symptoms and signs involving cognitive functions and awareness: Secondary | ICD-10-CM | POA: Diagnosis not present

## 2020-01-20 DIAGNOSIS — R278 Other lack of coordination: Secondary | ICD-10-CM | POA: Diagnosis not present

## 2020-01-20 DIAGNOSIS — M6281 Muscle weakness (generalized): Secondary | ICD-10-CM | POA: Diagnosis not present

## 2020-01-20 DIAGNOSIS — Z741 Need for assistance with personal care: Secondary | ICD-10-CM | POA: Diagnosis not present

## 2020-01-20 DIAGNOSIS — S065X0D Traumatic subdural hemorrhage without loss of consciousness, subsequent encounter: Secondary | ICD-10-CM | POA: Diagnosis not present

## 2020-01-20 DIAGNOSIS — R4189 Other symptoms and signs involving cognitive functions and awareness: Secondary | ICD-10-CM | POA: Diagnosis not present

## 2020-01-20 DIAGNOSIS — H8110 Benign paroxysmal vertigo, unspecified ear: Secondary | ICD-10-CM | POA: Diagnosis not present

## 2020-01-21 DIAGNOSIS — M6281 Muscle weakness (generalized): Secondary | ICD-10-CM | POA: Diagnosis not present

## 2020-01-21 DIAGNOSIS — H8110 Benign paroxysmal vertigo, unspecified ear: Secondary | ICD-10-CM | POA: Diagnosis not present

## 2020-01-21 DIAGNOSIS — S065X0D Traumatic subdural hemorrhage without loss of consciousness, subsequent encounter: Secondary | ICD-10-CM | POA: Diagnosis not present

## 2020-01-21 DIAGNOSIS — Z741 Need for assistance with personal care: Secondary | ICD-10-CM | POA: Diagnosis not present

## 2020-01-21 DIAGNOSIS — R4189 Other symptoms and signs involving cognitive functions and awareness: Secondary | ICD-10-CM | POA: Diagnosis not present

## 2020-01-21 DIAGNOSIS — R278 Other lack of coordination: Secondary | ICD-10-CM | POA: Diagnosis not present

## 2020-01-25 DIAGNOSIS — Z741 Need for assistance with personal care: Secondary | ICD-10-CM | POA: Diagnosis not present

## 2020-01-25 DIAGNOSIS — R278 Other lack of coordination: Secondary | ICD-10-CM | POA: Diagnosis not present

## 2020-01-25 DIAGNOSIS — R4189 Other symptoms and signs involving cognitive functions and awareness: Secondary | ICD-10-CM | POA: Diagnosis not present

## 2020-01-25 DIAGNOSIS — H8110 Benign paroxysmal vertigo, unspecified ear: Secondary | ICD-10-CM | POA: Diagnosis not present

## 2020-01-25 DIAGNOSIS — M6281 Muscle weakness (generalized): Secondary | ICD-10-CM | POA: Diagnosis not present

## 2020-01-25 DIAGNOSIS — S065X0D Traumatic subdural hemorrhage without loss of consciousness, subsequent encounter: Secondary | ICD-10-CM | POA: Diagnosis not present

## 2020-02-01 DIAGNOSIS — H8110 Benign paroxysmal vertigo, unspecified ear: Secondary | ICD-10-CM | POA: Diagnosis not present

## 2020-02-01 DIAGNOSIS — Z741 Need for assistance with personal care: Secondary | ICD-10-CM | POA: Diagnosis not present

## 2020-02-01 DIAGNOSIS — R4189 Other symptoms and signs involving cognitive functions and awareness: Secondary | ICD-10-CM | POA: Diagnosis not present

## 2020-02-01 DIAGNOSIS — S065X0D Traumatic subdural hemorrhage without loss of consciousness, subsequent encounter: Secondary | ICD-10-CM | POA: Diagnosis not present

## 2020-02-01 DIAGNOSIS — R278 Other lack of coordination: Secondary | ICD-10-CM | POA: Diagnosis not present

## 2020-02-01 DIAGNOSIS — M6281 Muscle weakness (generalized): Secondary | ICD-10-CM | POA: Diagnosis not present

## 2020-02-01 DIAGNOSIS — E1165 Type 2 diabetes mellitus with hyperglycemia: Secondary | ICD-10-CM

## 2020-02-03 DIAGNOSIS — E119 Type 2 diabetes mellitus without complications: Secondary | ICD-10-CM | POA: Diagnosis not present

## 2020-02-03 DIAGNOSIS — I10 Essential (primary) hypertension: Secondary | ICD-10-CM | POA: Diagnosis not present

## 2020-02-07 DIAGNOSIS — I10 Essential (primary) hypertension: Secondary | ICD-10-CM | POA: Diagnosis not present

## 2020-02-07 DIAGNOSIS — M1990 Unspecified osteoarthritis, unspecified site: Secondary | ICD-10-CM | POA: Diagnosis not present

## 2020-02-07 DIAGNOSIS — E119 Type 2 diabetes mellitus without complications: Secondary | ICD-10-CM | POA: Diagnosis not present

## 2020-02-07 DIAGNOSIS — R4189 Other symptoms and signs involving cognitive functions and awareness: Secondary | ICD-10-CM | POA: Diagnosis not present

## 2020-02-07 DIAGNOSIS — R262 Difficulty in walking, not elsewhere classified: Secondary | ICD-10-CM | POA: Diagnosis not present

## 2020-02-07 DIAGNOSIS — E785 Hyperlipidemia, unspecified: Secondary | ICD-10-CM | POA: Diagnosis not present

## 2020-02-07 DIAGNOSIS — S065X9D Traumatic subdural hemorrhage with loss of consciousness of unspecified duration, subsequent encounter: Secondary | ICD-10-CM | POA: Diagnosis not present

## 2020-02-07 DIAGNOSIS — R42 Dizziness and giddiness: Secondary | ICD-10-CM | POA: Diagnosis not present

## 2020-02-07 DIAGNOSIS — S065X0D Traumatic subdural hemorrhage without loss of consciousness, subsequent encounter: Secondary | ICD-10-CM | POA: Diagnosis not present

## 2020-02-07 DIAGNOSIS — R278 Other lack of coordination: Secondary | ICD-10-CM | POA: Diagnosis not present

## 2020-02-07 DIAGNOSIS — M6281 Muscle weakness (generalized): Secondary | ICD-10-CM | POA: Diagnosis not present

## 2020-02-07 DIAGNOSIS — R1311 Dysphagia, oral phase: Secondary | ICD-10-CM | POA: Diagnosis not present

## 2020-02-07 DIAGNOSIS — Z8673 Personal history of transient ischemic attack (TIA), and cerebral infarction without residual deficits: Secondary | ICD-10-CM | POA: Diagnosis not present

## 2020-02-07 DIAGNOSIS — D649 Anemia, unspecified: Secondary | ICD-10-CM | POA: Diagnosis not present

## 2020-02-07 DIAGNOSIS — M810 Age-related osteoporosis without current pathological fracture: Secondary | ICD-10-CM | POA: Diagnosis not present

## 2020-02-07 DIAGNOSIS — W19XXXD Unspecified fall, subsequent encounter: Secondary | ICD-10-CM | POA: Diagnosis not present

## 2020-02-07 DIAGNOSIS — J4521 Mild intermittent asthma with (acute) exacerbation: Secondary | ICD-10-CM | POA: Diagnosis not present

## 2020-02-07 DIAGNOSIS — R2681 Unsteadiness on feet: Secondary | ICD-10-CM | POA: Diagnosis not present

## 2020-02-07 DIAGNOSIS — H8110 Benign paroxysmal vertigo, unspecified ear: Secondary | ICD-10-CM | POA: Diagnosis not present

## 2020-02-07 DIAGNOSIS — Z741 Need for assistance with personal care: Secondary | ICD-10-CM | POA: Diagnosis not present

## 2020-02-09 DIAGNOSIS — R278 Other lack of coordination: Secondary | ICD-10-CM | POA: Diagnosis not present

## 2020-02-09 DIAGNOSIS — Z741 Need for assistance with personal care: Secondary | ICD-10-CM | POA: Diagnosis not present

## 2020-02-09 DIAGNOSIS — S065X0D Traumatic subdural hemorrhage without loss of consciousness, subsequent encounter: Secondary | ICD-10-CM | POA: Diagnosis not present

## 2020-02-09 DIAGNOSIS — R4189 Other symptoms and signs involving cognitive functions and awareness: Secondary | ICD-10-CM | POA: Diagnosis not present

## 2020-02-09 DIAGNOSIS — H8110 Benign paroxysmal vertigo, unspecified ear: Secondary | ICD-10-CM | POA: Diagnosis not present

## 2020-02-09 DIAGNOSIS — M6281 Muscle weakness (generalized): Secondary | ICD-10-CM | POA: Diagnosis not present

## 2020-02-10 DIAGNOSIS — Z741 Need for assistance with personal care: Secondary | ICD-10-CM | POA: Diagnosis not present

## 2020-02-10 DIAGNOSIS — R278 Other lack of coordination: Secondary | ICD-10-CM | POA: Diagnosis not present

## 2020-02-10 DIAGNOSIS — S065X0D Traumatic subdural hemorrhage without loss of consciousness, subsequent encounter: Secondary | ICD-10-CM | POA: Diagnosis not present

## 2020-02-10 DIAGNOSIS — R4189 Other symptoms and signs involving cognitive functions and awareness: Secondary | ICD-10-CM | POA: Diagnosis not present

## 2020-02-10 DIAGNOSIS — M6281 Muscle weakness (generalized): Secondary | ICD-10-CM | POA: Diagnosis not present

## 2020-02-10 DIAGNOSIS — H8110 Benign paroxysmal vertigo, unspecified ear: Secondary | ICD-10-CM | POA: Diagnosis not present

## 2020-02-14 DIAGNOSIS — M6281 Muscle weakness (generalized): Secondary | ICD-10-CM | POA: Diagnosis not present

## 2020-02-14 DIAGNOSIS — S065X0D Traumatic subdural hemorrhage without loss of consciousness, subsequent encounter: Secondary | ICD-10-CM | POA: Diagnosis not present

## 2020-02-14 DIAGNOSIS — R4189 Other symptoms and signs involving cognitive functions and awareness: Secondary | ICD-10-CM | POA: Diagnosis not present

## 2020-02-14 DIAGNOSIS — R278 Other lack of coordination: Secondary | ICD-10-CM | POA: Diagnosis not present

## 2020-02-14 DIAGNOSIS — H8110 Benign paroxysmal vertigo, unspecified ear: Secondary | ICD-10-CM | POA: Diagnosis not present

## 2020-02-14 DIAGNOSIS — Z741 Need for assistance with personal care: Secondary | ICD-10-CM | POA: Diagnosis not present

## 2020-02-15 DIAGNOSIS — S065X0D Traumatic subdural hemorrhage without loss of consciousness, subsequent encounter: Secondary | ICD-10-CM | POA: Diagnosis not present

## 2020-02-15 DIAGNOSIS — H8110 Benign paroxysmal vertigo, unspecified ear: Secondary | ICD-10-CM | POA: Diagnosis not present

## 2020-02-15 DIAGNOSIS — R278 Other lack of coordination: Secondary | ICD-10-CM | POA: Diagnosis not present

## 2020-02-15 DIAGNOSIS — R4189 Other symptoms and signs involving cognitive functions and awareness: Secondary | ICD-10-CM | POA: Diagnosis not present

## 2020-02-15 DIAGNOSIS — M6281 Muscle weakness (generalized): Secondary | ICD-10-CM | POA: Diagnosis not present

## 2020-02-15 DIAGNOSIS — Z741 Need for assistance with personal care: Secondary | ICD-10-CM | POA: Diagnosis not present

## 2020-02-21 DIAGNOSIS — H8110 Benign paroxysmal vertigo, unspecified ear: Secondary | ICD-10-CM | POA: Diagnosis not present

## 2020-02-21 DIAGNOSIS — R278 Other lack of coordination: Secondary | ICD-10-CM | POA: Diagnosis not present

## 2020-02-21 DIAGNOSIS — R4189 Other symptoms and signs involving cognitive functions and awareness: Secondary | ICD-10-CM | POA: Diagnosis not present

## 2020-02-21 DIAGNOSIS — Z741 Need for assistance with personal care: Secondary | ICD-10-CM | POA: Diagnosis not present

## 2020-02-21 DIAGNOSIS — S065X0D Traumatic subdural hemorrhage without loss of consciousness, subsequent encounter: Secondary | ICD-10-CM | POA: Diagnosis not present

## 2020-02-21 DIAGNOSIS — M6281 Muscle weakness (generalized): Secondary | ICD-10-CM | POA: Diagnosis not present

## 2020-02-22 DIAGNOSIS — H8110 Benign paroxysmal vertigo, unspecified ear: Secondary | ICD-10-CM | POA: Diagnosis not present

## 2020-02-22 DIAGNOSIS — R278 Other lack of coordination: Secondary | ICD-10-CM | POA: Diagnosis not present

## 2020-02-22 DIAGNOSIS — Z741 Need for assistance with personal care: Secondary | ICD-10-CM | POA: Diagnosis not present

## 2020-02-22 DIAGNOSIS — R4189 Other symptoms and signs involving cognitive functions and awareness: Secondary | ICD-10-CM | POA: Diagnosis not present

## 2020-02-22 DIAGNOSIS — S065X0D Traumatic subdural hemorrhage without loss of consciousness, subsequent encounter: Secondary | ICD-10-CM | POA: Diagnosis not present

## 2020-02-22 DIAGNOSIS — M6281 Muscle weakness (generalized): Secondary | ICD-10-CM | POA: Diagnosis not present

## 2020-02-23 DIAGNOSIS — Z87820 Personal history of traumatic brain injury: Secondary | ICD-10-CM | POA: Diagnosis not present

## 2020-02-23 DIAGNOSIS — I10 Essential (primary) hypertension: Secondary | ICD-10-CM | POA: Diagnosis not present

## 2020-02-23 DIAGNOSIS — I700 Atherosclerosis of aorta: Secondary | ICD-10-CM

## 2020-02-23 DIAGNOSIS — H353292 Exudative age-related macular degeneration, unspecified eye, with inactive choroidal neovascularization: Secondary | ICD-10-CM

## 2020-02-23 DIAGNOSIS — E1140 Type 2 diabetes mellitus with diabetic neuropathy, unspecified: Secondary | ICD-10-CM | POA: Diagnosis not present

## 2020-02-23 DIAGNOSIS — F39 Unspecified mood [affective] disorder: Secondary | ICD-10-CM | POA: Diagnosis not present

## 2020-02-23 DIAGNOSIS — Z8673 Personal history of transient ischemic attack (TIA), and cerebral infarction without residual deficits: Secondary | ICD-10-CM

## 2020-04-21 DIAGNOSIS — B351 Tinea unguium: Secondary | ICD-10-CM | POA: Diagnosis not present

## 2020-04-24 DIAGNOSIS — Z8673 Personal history of transient ischemic attack (TIA), and cerebral infarction without residual deficits: Secondary | ICD-10-CM | POA: Diagnosis not present

## 2020-04-24 DIAGNOSIS — E1142 Type 2 diabetes mellitus with diabetic polyneuropathy: Secondary | ICD-10-CM | POA: Diagnosis not present

## 2020-04-24 DIAGNOSIS — I700 Atherosclerosis of aorta: Secondary | ICD-10-CM

## 2020-04-24 DIAGNOSIS — I10 Essential (primary) hypertension: Secondary | ICD-10-CM | POA: Diagnosis not present

## 2020-04-24 DIAGNOSIS — F39 Unspecified mood [affective] disorder: Secondary | ICD-10-CM | POA: Diagnosis not present

## 2020-06-28 DIAGNOSIS — Z8673 Personal history of transient ischemic attack (TIA), and cerebral infarction without residual deficits: Secondary | ICD-10-CM | POA: Diagnosis not present

## 2020-06-28 DIAGNOSIS — E785 Hyperlipidemia, unspecified: Secondary | ICD-10-CM | POA: Diagnosis not present

## 2020-06-28 DIAGNOSIS — I739 Peripheral vascular disease, unspecified: Secondary | ICD-10-CM

## 2020-06-28 DIAGNOSIS — E1140 Type 2 diabetes mellitus with diabetic neuropathy, unspecified: Secondary | ICD-10-CM | POA: Diagnosis not present

## 2020-06-28 DIAGNOSIS — I10 Essential (primary) hypertension: Secondary | ICD-10-CM | POA: Diagnosis not present

## 2020-06-28 DIAGNOSIS — I700 Atherosclerosis of aorta: Secondary | ICD-10-CM

## 2020-08-29 DIAGNOSIS — I10 Essential (primary) hypertension: Secondary | ICD-10-CM | POA: Diagnosis not present

## 2020-08-29 DIAGNOSIS — F0150 Vascular dementia without behavioral disturbance: Secondary | ICD-10-CM | POA: Diagnosis not present

## 2020-08-29 DIAGNOSIS — I700 Atherosclerosis of aorta: Secondary | ICD-10-CM

## 2020-08-29 DIAGNOSIS — J309 Allergic rhinitis, unspecified: Secondary | ICD-10-CM | POA: Diagnosis not present

## 2020-08-29 DIAGNOSIS — E1142 Type 2 diabetes mellitus with diabetic polyneuropathy: Secondary | ICD-10-CM | POA: Diagnosis not present

## 2020-10-25 DIAGNOSIS — Z8673 Personal history of transient ischemic attack (TIA), and cerebral infarction without residual deficits: Secondary | ICD-10-CM | POA: Diagnosis not present

## 2020-10-25 DIAGNOSIS — E1140 Type 2 diabetes mellitus with diabetic neuropathy, unspecified: Secondary | ICD-10-CM | POA: Diagnosis not present

## 2020-10-25 DIAGNOSIS — I10 Essential (primary) hypertension: Secondary | ICD-10-CM | POA: Diagnosis not present

## 2020-10-25 DIAGNOSIS — F0150 Vascular dementia without behavioral disturbance: Secondary | ICD-10-CM | POA: Diagnosis not present

## 2021-01-01 DIAGNOSIS — I10 Essential (primary) hypertension: Secondary | ICD-10-CM | POA: Diagnosis not present

## 2021-01-01 DIAGNOSIS — F0150 Vascular dementia without behavioral disturbance: Secondary | ICD-10-CM | POA: Diagnosis not present

## 2021-01-01 DIAGNOSIS — E1142 Type 2 diabetes mellitus with diabetic polyneuropathy: Secondary | ICD-10-CM | POA: Diagnosis not present

## 2021-01-01 DIAGNOSIS — J309 Allergic rhinitis, unspecified: Secondary | ICD-10-CM | POA: Diagnosis not present

## 2021-01-16 DIAGNOSIS — D696 Thrombocytopenia, unspecified: Secondary | ICD-10-CM

## 2021-01-16 DIAGNOSIS — N1831 Chronic kidney disease, stage 3a: Secondary | ICD-10-CM

## 2021-01-22 DIAGNOSIS — D696 Thrombocytopenia, unspecified: Secondary | ICD-10-CM | POA: Diagnosis not present

## 2021-02-21 DIAGNOSIS — F0150 Vascular dementia without behavioral disturbance: Secondary | ICD-10-CM | POA: Diagnosis not present

## 2021-02-21 DIAGNOSIS — Z8673 Personal history of transient ischemic attack (TIA), and cerebral infarction without residual deficits: Secondary | ICD-10-CM

## 2021-02-21 DIAGNOSIS — H3530 Unspecified macular degeneration: Secondary | ICD-10-CM

## 2021-02-21 DIAGNOSIS — I10 Essential (primary) hypertension: Secondary | ICD-10-CM | POA: Diagnosis not present

## 2021-02-21 DIAGNOSIS — I700 Atherosclerosis of aorta: Secondary | ICD-10-CM | POA: Diagnosis not present

## 2021-02-21 DIAGNOSIS — E785 Hyperlipidemia, unspecified: Secondary | ICD-10-CM | POA: Diagnosis not present

## 2021-02-21 DIAGNOSIS — E1140 Type 2 diabetes mellitus with diabetic neuropathy, unspecified: Secondary | ICD-10-CM

## 2021-02-22 LAB — CBC AND DIFFERENTIAL
HCT: 39 (ref 36–46)
Hemoglobin: 12.6 (ref 12.0–16.0)
Platelets: 78 10*3/uL — AB (ref 150–400)
WBC: 7.6

## 2021-02-22 LAB — CBC: RBC: 4.52 (ref 3.87–5.11)

## 2021-05-01 DIAGNOSIS — I10 Essential (primary) hypertension: Secondary | ICD-10-CM | POA: Diagnosis not present

## 2021-05-01 DIAGNOSIS — E1142 Type 2 diabetes mellitus with diabetic polyneuropathy: Secondary | ICD-10-CM | POA: Diagnosis not present

## 2021-05-01 DIAGNOSIS — F0150 Vascular dementia without behavioral disturbance: Secondary | ICD-10-CM | POA: Diagnosis not present

## 2021-05-01 DIAGNOSIS — I700 Atherosclerosis of aorta: Secondary | ICD-10-CM | POA: Diagnosis not present

## 2021-06-27 DIAGNOSIS — I10 Essential (primary) hypertension: Secondary | ICD-10-CM | POA: Diagnosis not present

## 2021-06-27 DIAGNOSIS — E785 Hyperlipidemia, unspecified: Secondary | ICD-10-CM

## 2021-06-27 DIAGNOSIS — E1169 Type 2 diabetes mellitus with other specified complication: Secondary | ICD-10-CM

## 2021-06-27 DIAGNOSIS — H353212 Exudative age-related macular degeneration, right eye, with inactive choroidal neovascularization: Secondary | ICD-10-CM

## 2021-06-27 DIAGNOSIS — Z8673 Personal history of transient ischemic attack (TIA), and cerebral infarction without residual deficits: Secondary | ICD-10-CM | POA: Diagnosis not present

## 2021-06-27 DIAGNOSIS — I700 Atherosclerosis of aorta: Secondary | ICD-10-CM | POA: Diagnosis not present

## 2021-06-27 DIAGNOSIS — F0150 Vascular dementia without behavioral disturbance: Secondary | ICD-10-CM | POA: Diagnosis not present

## 2021-07-02 LAB — HEMOGLOBIN A1C: Hemoglobin A1C: 8.3

## 2021-07-02 LAB — LIPID PANEL
Cholesterol: 95 (ref 0–200)
HDL: 33 — AB (ref 35–70)
LDL Cholesterol: 37
Triglycerides: 175 — AB (ref 40–160)

## 2021-08-28 DIAGNOSIS — J309 Allergic rhinitis, unspecified: Secondary | ICD-10-CM

## 2021-08-28 DIAGNOSIS — H353290 Exudative age-related macular degeneration, unspecified eye, stage unspecified: Secondary | ICD-10-CM

## 2021-08-28 DIAGNOSIS — I10 Essential (primary) hypertension: Secondary | ICD-10-CM | POA: Diagnosis not present

## 2021-08-28 DIAGNOSIS — I700 Atherosclerosis of aorta: Secondary | ICD-10-CM | POA: Diagnosis not present

## 2021-08-28 DIAGNOSIS — E1142 Type 2 diabetes mellitus with diabetic polyneuropathy: Secondary | ICD-10-CM | POA: Diagnosis not present

## 2021-08-28 DIAGNOSIS — F0150 Vascular dementia without behavioral disturbance: Secondary | ICD-10-CM | POA: Diagnosis not present

## 2021-10-24 DIAGNOSIS — E785 Hyperlipidemia, unspecified: Secondary | ICD-10-CM | POA: Diagnosis not present

## 2021-10-24 DIAGNOSIS — Z8673 Personal history of transient ischemic attack (TIA), and cerebral infarction without residual deficits: Secondary | ICD-10-CM

## 2021-10-24 DIAGNOSIS — I700 Atherosclerosis of aorta: Secondary | ICD-10-CM | POA: Diagnosis not present

## 2021-10-24 DIAGNOSIS — E1151 Type 2 diabetes mellitus with diabetic peripheral angiopathy without gangrene: Secondary | ICD-10-CM

## 2021-10-24 DIAGNOSIS — H3530 Unspecified macular degeneration: Secondary | ICD-10-CM

## 2021-10-24 DIAGNOSIS — E1141 Type 2 diabetes mellitus with diabetic mononeuropathy: Secondary | ICD-10-CM

## 2021-10-24 DIAGNOSIS — F0150 Vascular dementia without behavioral disturbance: Secondary | ICD-10-CM | POA: Diagnosis not present

## 2021-10-24 DIAGNOSIS — I10 Essential (primary) hypertension: Secondary | ICD-10-CM | POA: Diagnosis not present

## 2021-12-27 ENCOUNTER — Encounter: Payer: Self-pay | Admitting: Nurse Practitioner

## 2021-12-27 ENCOUNTER — Non-Acute Institutional Stay (SKILLED_NURSING_FACILITY): Payer: Medicare Other | Admitting: Nurse Practitioner

## 2021-12-27 DIAGNOSIS — J3089 Other allergic rhinitis: Secondary | ICD-10-CM | POA: Diagnosis not present

## 2021-12-27 DIAGNOSIS — F01A0 Vascular dementia, mild, without behavioral disturbance, psychotic disturbance, mood disturbance, and anxiety: Secondary | ICD-10-CM

## 2021-12-27 DIAGNOSIS — I10 Essential (primary) hypertension: Secondary | ICD-10-CM

## 2021-12-27 DIAGNOSIS — K5904 Chronic idiopathic constipation: Secondary | ICD-10-CM

## 2021-12-27 DIAGNOSIS — E781 Pure hyperglyceridemia: Secondary | ICD-10-CM

## 2021-12-27 DIAGNOSIS — E1142 Type 2 diabetes mellitus with diabetic polyneuropathy: Secondary | ICD-10-CM

## 2021-12-27 DIAGNOSIS — R42 Dizziness and giddiness: Secondary | ICD-10-CM

## 2021-12-27 DIAGNOSIS — Z794 Long term (current) use of insulin: Secondary | ICD-10-CM

## 2021-12-27 NOTE — Progress Notes (Signed)
Location:  Other Nursing Home Room Number: [facility] [address] Place of Service:  SNF [phone])  Unk Lightning, [place], MD  Patient Care Team: Dewayne Shorter, MD as PCP - General [facility])  Extended Emergency Contact Information Primary Emergency Contact: Sano,Geoff Address: [address]          [place], [place] [address] Johnnette Litter of [place] Phone: [phone] Mobile Phone: [phone] Relation: Son Secondary Emergency Contact: Amparo [place] Address: [address]          [place], [place] [address] Home Phone: [phone] Mobile Phone: [phone] Relation: Son  Goals of care: Advanced Directive information    08/13/2019    7:08 PM  Advanced Directives  Does Patient Have a Medical Advance Directive? No     Chief Complaint  Patient presents with   Medical Management of Chronic Issues    HPI:  Pt is a 88 y.o. female seen today for an routine visit for medication management at [facility].   DM- no low blood sugars, she is on scheduled glargine 45 units at qhs  Reports she has no pain to her legs, hx of neuropathy and on gabapentin 100 mg qhs  She is on meclizine scheduled but continues to complain of dizziness, unsure if it really effective.   Seasonal allergies- on Montelukast and zyrtec daily   Constipation controlled with miralax   Hyperlipidemia- on crestor daily   Htn- controlled    Past Medical History:  Diagnosis Date   Asthma    as a child   Bell's palsy    Diverticulosis    Environmental and seasonal allergies    Hyperlipidemia    Hypertension    IBS (irritable bowel syndrome)    Lower extremity edema    Macular degeneration, wet (HCC)    Migraines    Osteoporosis    TIA (transient ischemic attack)    Type 2 diabetes mellitus with polyneuropathy [facility])    Past Surgical History:  Procedure Laterality Date   CATARACT EXTRACTION W/PHACO Left 05/29/2016   Procedure: CATARACT EXTRACTION PHACO AND INTRAOCULAR  LENS PLACEMENT ([place]);  Surgeon: Estill Cotta, MD;  Location: [facility] ORS;  Service: Ophthalmology;  Laterality: Left;  [place]: 01:48.8 AP% 25.2 CDE: 50.41 [id number]   CHOLECYSTECTOMY     COLONOSCOPY     OOPHORECTOMY     TONSILLECTOMY      Allergies  Allergen Reactions   Erythromycin Diarrhea    Outpatient Encounter Medications as of 12/27/2021  Medication Sig   acetaminophen (TYLENOL) 325 MG tablet Take 975 mg by mouth every 6 (six) hours as needed.   albuterol (VENTOLIN HFA) 108 (90 Base) MCG/ACT inhaler Inhale 2 puffs into the lungs every 4 (four) hours as needed for wheezing or shortness of breath.   benzocaine (HURRICAINE) 20 % GEL Use as directed 1 Application in the mouth or throat every 2 (two) hours as needed.   cetirizine (ZYRTEC) 10 MG tablet Take 10 mg by mouth daily.   hydrocortisone (PROCTOZONE-HC) 2.5 % rectal cream Place 1 application  rectally every 12 (twelve) hours as needed for hemorrhoids.   hydroxypropyl methylcellulose / hypromellose (ISOPTO TEARS / GONIOVISC) 2.5 % ophthalmic solution Place 1 drop into both eyes 2 (two) times daily as needed for dry eyes.   Insulin Glargine (BASAGLAR KWIKPEN) 100 UNIT/ML Inject 45 Units into the skin at bedtime.   metFORMIN (GLUCOPHAGE-XR) 500 MG 24 hr tablet Take 1,000 mg by mouth daily with breakfast. at   montelukast (SINGULAIR) 10 MG  tablet Take 10 mg by mouth at bedtime.   polyethylene glycol (MIRALAX / GLYCOLAX) packet Take 17 g by mouth daily.   rosuvastatin (CRESTOR) 10 MG tablet Take 10 mg by mouth daily.   Skin Protectants, Misc. (EUCERIN) cream Apply 1 Application topically daily.   Vitamin D, Ergocalciferol, (DRISDOL) 1.25 MG (50000 UNIT) CAPS capsule Take 50,000 Units by mouth every 7 (seven) days. Wednesday   [DISCONTINUED] acetaminophen (TYLENOL) 650 MG CR tablet Take 650 mg by mouth 2 (two) times daily.   [DISCONTINUED] clopidogrel (PLAVIX) 75 MG tablet Take 75 mg by mouth daily.    [DISCONTINUED]  Cranberry-Vitamin C (AZO CRANBERRY URINARY TRACT) 250-60 MG CAPS Take 2 capsules by mouth at bedtime.   [DISCONTINUED] glimepiride (AMARYL) 4 MG tablet Take 2 mg by mouth daily with breakfast.   [DISCONTINUED] Lidocaine HCl (ASPERCREME LIDOCAINE) 4 % CREA Apply topically every 4 (four) hours as needed (apply to right shoulder topically every 4 hours as needed for pain).   [DISCONTINUED] lisinopril (ZESTRIL) 5 MG tablet Take 5 mg by mouth daily.   [DISCONTINUED] oxybutynin (DITROPAN) 5 MG tablet Take 5 mg by mouth at bedtime.   [DISCONTINUED] psyllium (METAMUCIL) 58.6 % powder Take 1 packet by mouth daily.   [DISCONTINUED] rosuvastatin (CRESTOR) 20 MG tablet Take 20 mg by mouth daily.    [DISCONTINUED] sennosides-docusate sodium (SENOKOT-S) 8.6-50 MG tablet Take 1 tablet by mouth daily.   No facility-administered encounter medications on file as of 12/27/2021.    Review of Systems  Constitutional:  Negative for activity change, appetite change, fatigue and unexpected weight change.  HENT:  Negative for congestion and hearing loss.   Eyes: Negative.   Respiratory:  Negative for cough and shortness of breath.   Cardiovascular:  Negative for chest pain, palpitations and leg swelling.  Gastrointestinal:  Negative for abdominal pain, constipation and diarrhea.  Genitourinary:  Negative for difficulty urinating and dysuria.  Musculoskeletal:  Negative for arthralgias and myalgias.  Skin:  Negative for color change and wound.  Neurological:  Positive for dizziness and weakness.  Psychiatric/Behavioral:  Negative for agitation, behavioral problems and confusion.     Immunization History  Administered Date(s) Administered   Influenza Inj Mdck Quad Pf 12/15/2017   Influenza-Unspecified 12/03/2018   Pneumococcal Conjugate-13 02/16/2015   Tdap 11/26/2013, 04/27/2019   Pertinent  Health Maintenance Due  Topic Date Due   FOOT EXAM  Never done   DEXA SCAN  Never done   OPHTHALMOLOGY EXAM   09/12/2020   INFLUENZA VACCINE  10/02/2021   HEMOGLOBIN A1C  01/02/2022      04/27/2019    7:06 AM 04/27/2019    7:13 AM 07/17/2019    6:23 PM 07/22/2019    5:24 AM 08/13/2019    7:08 PM  Fall Risk  Patient Fall Risk Level Moderate fall risk High fall risk High fall risk High fall risk High fall risk   Functional Status Survey:    Vitals:   12/27/21 1515  BP: 125/76  Pulse: 74  Temp: (!) 97 F (36.1 C)  Weight: 131 lb 6.4 oz (59.6 kg)   Body mass index is 24.03 kg/m. Physical Exam Constitutional:      General: She is not in acute distress.    Appearance: She is well-developed. She is not diaphoretic.  HENT:     Head: Normocephalic and atraumatic.     Mouth/Throat:     Pharynx: No oropharyngeal exudate.  Eyes:     Conjunctiva/sclera: Conjunctivae normal.  Pupils: Pupils are equal, round, and reactive to light.  Cardiovascular:     Rate and Rhythm: Normal rate and regular rhythm.     Heart sounds: Normal heart sounds.  Pulmonary:     Effort: Pulmonary effort is normal.     Breath sounds: Normal breath sounds.  Abdominal:     General: Bowel sounds are normal.     Palpations: Abdomen is soft.  Musculoskeletal:     Cervical back: Normal range of motion and neck supple.     Right lower leg: No edema.     Left lower leg: No edema.  Skin:    General: Skin is warm and dry.  Neurological:     Mental Status: She is alert.  Psychiatric:        Mood and Affect: Mood normal.     Labs reviewed: No results for input(s): "NA", "K", "CL", "CO2", "GLUCOSE", "BUN", "CREATININE", "CALCIUM", "MG", "PHOS" in the last 8760 hours. No results for input(s): "AST", "ALT", "ALKPHOS", "BILITOT", "PROT", "ALBUMIN" in the last 8760 hours. Recent Labs    02/22/21 0000  WBC 7.6  HGB 12.6  HCT 39  PLT 78*   No results found for: "TSH" Lab Results  Component Value Date   HGBA1C 8.3 07/02/2021   Lab Results  Component Value Date   CHOL 95 07/02/2021   HDL 33 (A) 07/02/2021    LDLCALC 37 07/02/2021   TRIG 175 (A) 07/02/2021    Significant Diagnostic Results in last 30 days:  No results found.  Assessment/Plan 1. Type 2 diabetes mellitus with diabetic polyneuropathy, with long-term current use of insulin ([place]) -Encouraged dietary compliance, routine foot care/monitoring and to keep up with diabetic eye exams through ophthalmology. Will continue glargine and metformin at this time -will dc gabapentin- no complaints of neuropathy at this time  2. Environmental and seasonal allergies -continue on zyretec and Montelukast.   3. Chronic idiopathic constipation Well controlled on miralax  4. Primary hypertension Blood pressure well controlled, goal bp <140/90 Continue current medications and dietary modifications follow metabolic panel  5. Pure hypertriglyceridemia -continues on crestor, will follow up lipid.   6. Mild vascular dementia without behavioral disturbance, psychotic disturbance, mood disturbance, or anxiety (HCC) Stable, no acute changes in cognitive or functional status, continue supportive care with staff   7. Dizziness -ongoing, she is taking meclizine but no benefit. Will dc at this time.    Carlos American. [place], [facility] [phone]

## 2021-12-31 LAB — CBC AND DIFFERENTIAL
HCT: 43 (ref 36–46)
Hemoglobin: 14.3 (ref 12.0–16.0)
Platelets: 155 10*3/uL (ref 150–400)
WBC: 7.5

## 2021-12-31 LAB — HEMOGLOBIN A1C: Hemoglobin A1C: 8.2

## 2021-12-31 LAB — BASIC METABOLIC PANEL
BUN: 9 (ref 4–21)
CO2: 30 — AB (ref 13–22)
Chloride: 100 (ref 99–108)
Creatinine: 0.8 (ref 0.5–1.1)
Glucose: 130
Potassium: 4.1 mEq/L (ref 3.5–5.1)
Sodium: 141 (ref 137–147)

## 2021-12-31 LAB — CBC: RBC: 4.92 (ref 3.87–5.11)

## 2021-12-31 LAB — HEPATIC FUNCTION PANEL
ALT: 12 U/L (ref 7–35)
AST: 21 (ref 13–35)
Alkaline Phosphatase: 89 (ref 25–125)
Bilirubin, Total: 0.7

## 2021-12-31 LAB — LIPID PANEL
Cholesterol: 114 (ref 0–200)
HDL: 37 (ref 35–70)
LDL Cholesterol: 52
Triglycerides: 172 — AB (ref 40–160)

## 2021-12-31 LAB — COMPREHENSIVE METABOLIC PANEL
Albumin: 4.2 (ref 3.5–5.0)
Calcium: 9.6 (ref 8.7–10.7)
Globulin: 2.7
eGFR: 68

## 2022-01-02 LAB — HEPATIC FUNCTION PANEL
ALT: 16 U/L (ref 7–35)
AST: 23 (ref 13–35)
Alkaline Phosphatase: 91 (ref 25–125)
Bilirubin, Total: 0.8

## 2022-01-02 LAB — HEMOGLOBIN A1C: Hemoglobin A1C: 8.1

## 2022-01-02 LAB — CBC AND DIFFERENTIAL
HCT: 44 (ref 36–46)
Hemoglobin: 14.5 (ref 12.0–16.0)
Neutrophils Absolute: 7592
Platelets: 165 10*3/uL (ref 150–400)
WBC: 10.4

## 2022-01-02 LAB — BASIC METABOLIC PANEL
BUN: 10 (ref 4–21)
CO2: 27 — AB (ref 13–22)
Chloride: 99 (ref 99–108)
Creatinine: 0.8 (ref 0.5–1.1)
Glucose: 227
Potassium: 4.3 mEq/L (ref 3.5–5.1)
Sodium: 139 (ref 137–147)

## 2022-01-02 LAB — COMPREHENSIVE METABOLIC PANEL
Albumin: 4 (ref 3.5–5.0)
Calcium: 9.5 (ref 8.7–10.7)
Globulin: 2.7
eGFR: 64

## 2022-01-02 LAB — CBC: RBC: 5.03 (ref 3.87–5.11)

## 2022-01-05 IMAGING — CT CT HEAD W/O CM
3 series · 14 of 47 positions shown, 16 images · non-contrast
Comparison: CT head and cervical spine 04/27/2019

CLINICAL DATA: Fall, loss of consciousness, possible head strike

EXAM:
CT HEAD WITHOUT CONTRAST
CT CERVICAL SPINE WITHOUT CONTRAST
TECHNIQUE: Multidetector CT imaging of the head and cervical spine was
performed following the standard protocol without intravenous
contrast. Multiplanar CT image reconstructions of the cervical spine
were also generated.

[Series 3: head wo · axial · 0.40mm/px · z∈[+740,+865]mm · 8 of 31 slices shown, 10 images]
[im 3/31  brain]
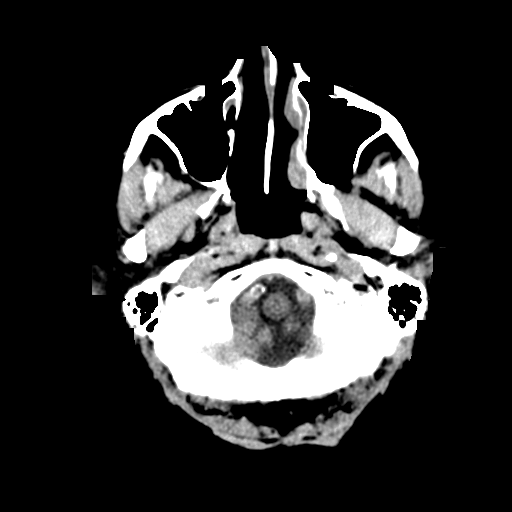
[im 3/31  bone]
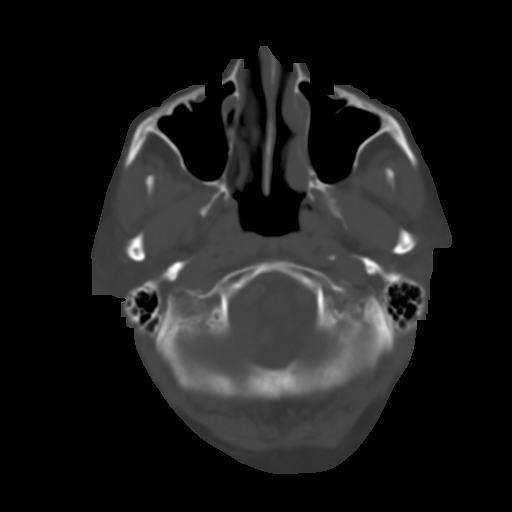
[im 7/31  brain]
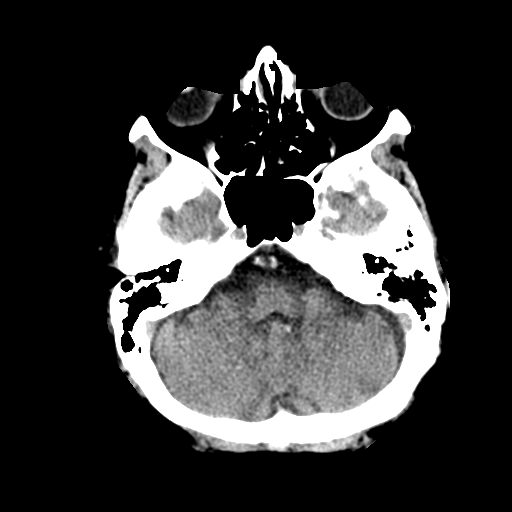
[im 10/31  brain]
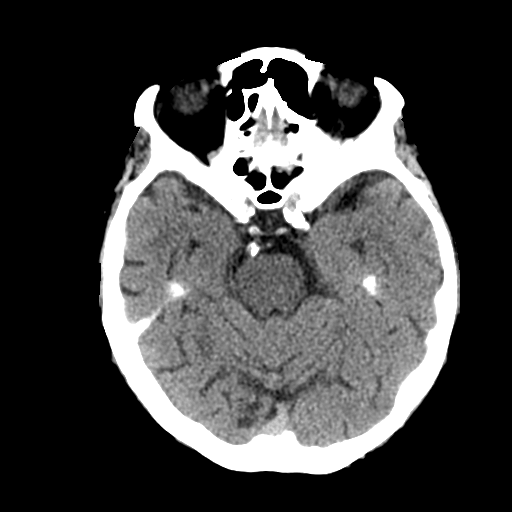
[im 14/31  brain]
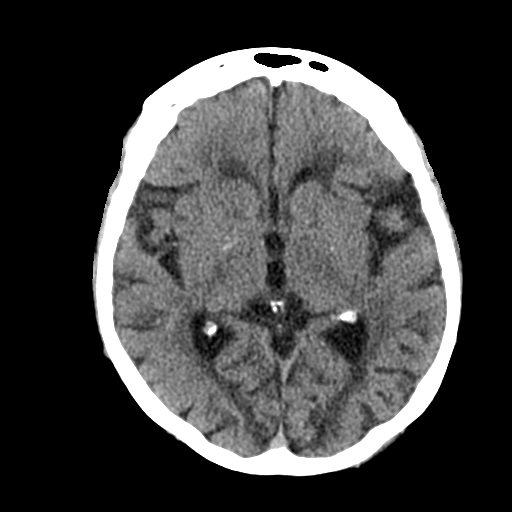
[im 17/31  brain]
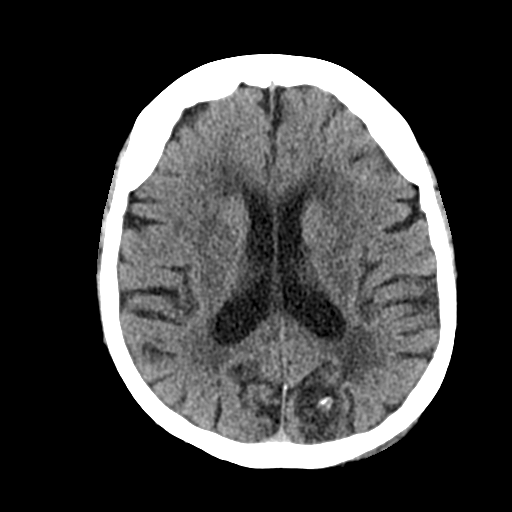
[im 17/31  bone]
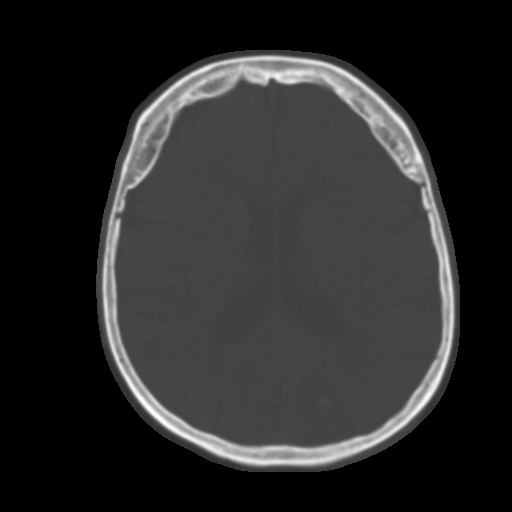
[im 21/31  brain]
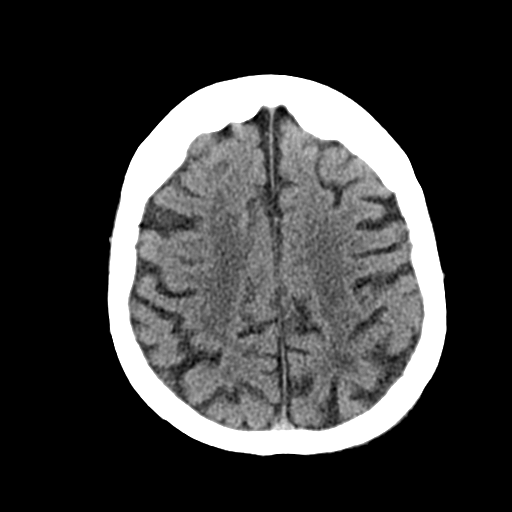
[im 24/31  brain]
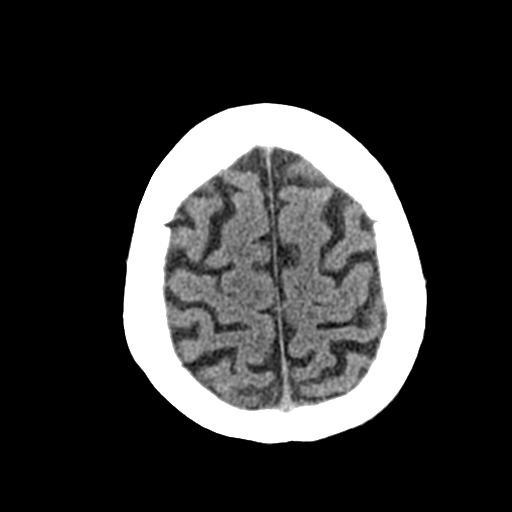
[im 28/31  brain]
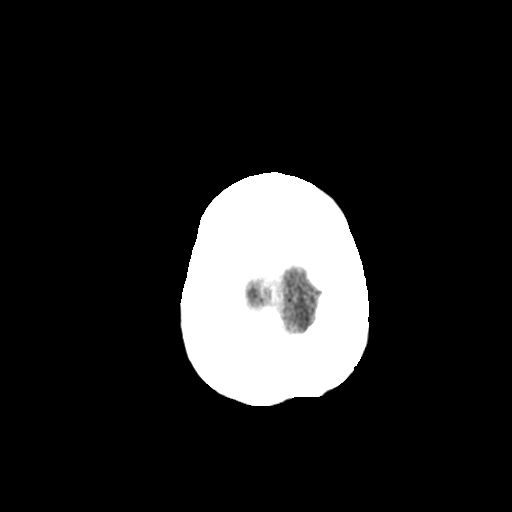

[Series 4: coronal soft tissue · coronal · 0.28mm/px · 3 of 58 slices shown]
[im 21/58  brain]
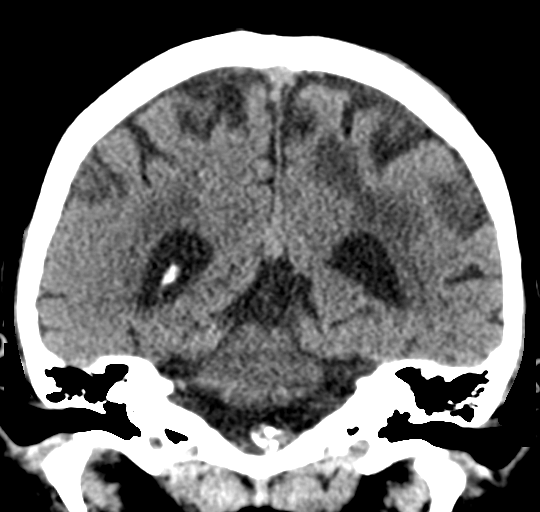
[im 26/58  brain]
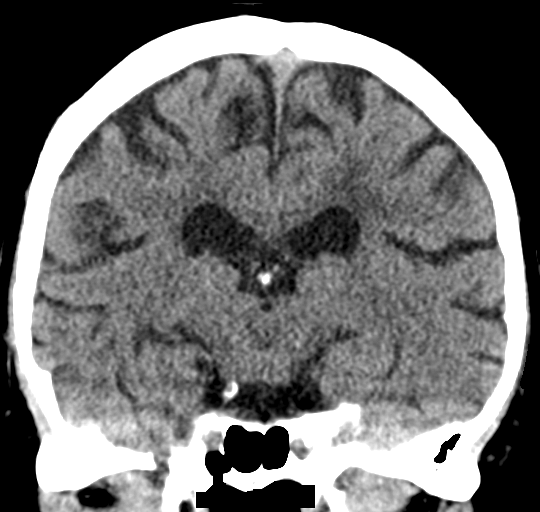
[im 32/58  brain]
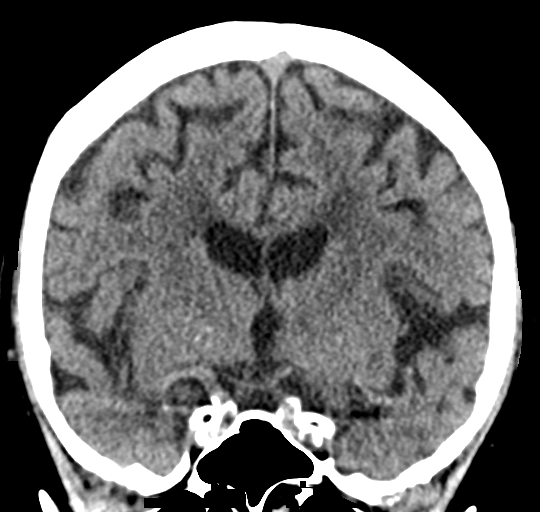

[Series 5: sagittal soft tissue · sagittal · 0.28mm/px · 3 of 51 slices shown]
[im 17/51  brain]
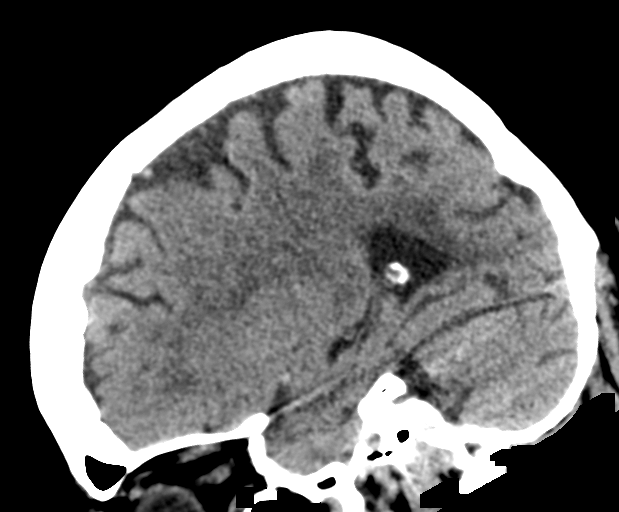
[im 26/51  brain]
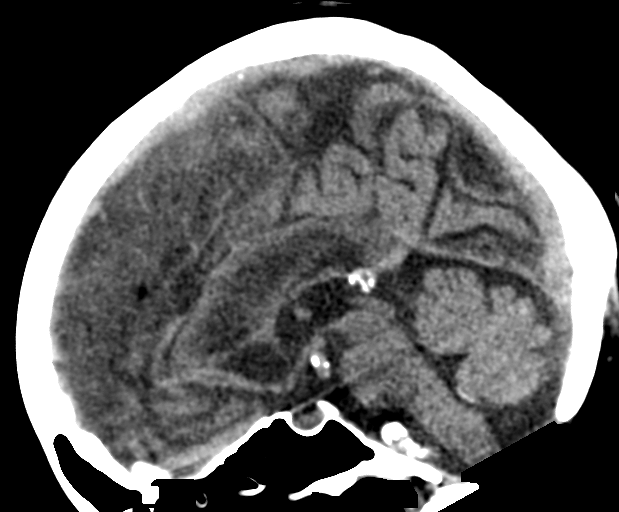
[im 34/51  brain]
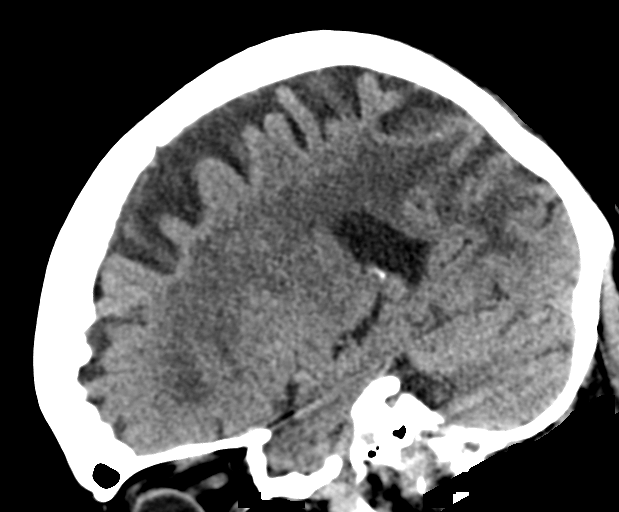

[14 of 47 positions shown; findings below may reference images not displayed]

FINDINGS: CT HEAD FINDINGS

Brain: Stable regions of encephalomalacia and parenchymal
calcification bilateral occipital lobes. Remote lacunar infarcts in
the bilateral basal ganglia are noted as well. Senescent basal
ganglia mineralization is present. No evidence of acute infarction,
hemorrhage, hydrocephalus, extra-axial collection or mass
lesion/mass effect. Symmetric prominence of the ventricles, cisterns
and sulci compatible with parenchymal volume loss. Patchy areas of
white matter hypoattenuation are most compatible with chronic
microvascular angiopathy.

Vascular: Extensive atherosclerotic calcification of the carotid
siphons and intradural vertebral arteries. No hyperdense vessel.

Skull: Contusive changes of the left parieto-occipital scalp with
small crescentic hematoma measuring up to 3 mm in maximal thickness.
No subjacent calvarial fracture. No acute osseous injury or
suspicious osseous lesions. Benign hyperostosis frontalis interna is
noted incidentally.

Sinuses/Orbits: Paranasal sinuses and mastoid air cells are
predominantly clear. Insert mastoids middle ear cavities are clear.
Orbital structures are unremarkable aside from prior left lens
extraction.

Other: Moderate bilateral TMJ arthrosis.

CT CERVICAL SPINE FINDINGS

Alignment: Stabilization collar absent at the time of examination.
No evidence of traumatic listhesis. No abnormally widened, perched
or jumped facets. Normal alignment of the craniocervical and
atlantoaxial articulations.

Skull base and vertebrae: No acute fracture. No primary bone lesion
or focal pathologic process.

Soft tissues and spinal canal: Mineralization about the dens likely
reflect calcium pyrophosphate deposition. No pre or paravertebral
fluid or swelling. No visible canal hematoma.

Disc levels: Multilevel intervertebral disc height loss with
spondylitic endplate changes. Features most pronounced at C5-6 where
a small posterior disc osteophyte complex effaces the ventral thecal
sac with only mild canal stenosis. Multilevel uncinate spurring and
facet hypertrophic changes are noted as well also most pronounced at
C5-6 but with only mild to moderate left foraminal narrowing.

Upper chest: Extensive biapical pleuroparenchymal scarring is noted.
No acute abnormality in the upper chest or imaged lung apices.

Other: Extensive atherosclerotic calcification of the thoracic
aortic arch and proximal great vessels including the cervical
carotids. 1.2 cm hypoattenuating left thyroid nodule.
IMPRESSION: CT head:

1. Contusive changes of the left parieto-occipital scalp with scalp
hematoma measuring up to 3 mm in maximal thickness. No subjacent
calvarial fracture.
2. No acute intracranial abnormality.
3. Stable regions of encephalomalacia and parenchymal calcification
bilateral occipital lobes and remote lacunar infarcts in the
bilateral basal ganglia.
4. Moderate parenchymal volume loss and chronic microvascular
ischemic changes.

CT cervical spine:

1. No acute fracture or traumatic listhesis of the cervical spine.
2. Multilevel degenerative disc disease and facet hypertrophic
changes of the cervical spine, most pronounced at C5-6 but with only
mild canal stenosis and mild to moderate left foraminal narrowing.
3. 1.2 cm hypoattenuating left thyroid nodule. Per size criteria, no
further imaging evaluation is warranted. This follows consensus
guidelines: Managing Incidental Thyroid Nodules Detected on Imaging:
White Paper of [REDACTED]. [HOSPITAL] 9521; [DATE]. and [place] 3-tiered system for managing
ITNs: [HOSPITAL]. [DATE]): 143-50
4. Aortic Atherosclerosis (80MBR-2X2.2).
5. Cervical and intracranial atherosclerosis.

## 2022-01-16 ENCOUNTER — Encounter: Payer: Self-pay | Admitting: Student

## 2022-01-16 ENCOUNTER — Non-Acute Institutional Stay (SKILLED_NURSING_FACILITY): Payer: Medicare Other | Admitting: Student

## 2022-01-16 DIAGNOSIS — R051 Acute cough: Secondary | ICD-10-CM

## 2022-01-16 NOTE — Progress Notes (Signed)
Received call from nurse that patient's CXR showed LL infiltrates. Given stability will wait for CBC to confirm leukocytosis prior to initiating antibiotic therapy. If positive will plan to treat with Omnicef 300 mg BID and Doxy 100 mg BID for 5 days. Will consider Antibiotic timeout at 72 hours if antibiotics are used.

## 2022-01-16 NOTE — Progress Notes (Signed)
Location:  Other [facility] Room Number: [place]:  SNF [phone]) Provider:  Dr. Amada Kingfisher, MD  Patient Care Team: Dewayne Shorter, MD as PCP - General [facility] Medicine)  Extended Emergency Contact Information Primary Emergency Contact: Czarnecki,Geoff Address: [address]          [place], [place] [address] Johnnette Litter of [place] Phone: [phone] Mobile Phone: [phone] Relation: Son Secondary Emergency Contact: Amparo Bristol Address: [address]          [place], [place] [address] Home Phone: [phone] Mobile Phone: [phone] Relation: Son  Code Status:  Full Goals of care: Advanced Directive information    01/16/2022    9:35 AM  Advanced Directives  Does Patient Have a Medical Advance Directive? No  Would patient like information on creating a medical advance directive? No - Patient declined     Chief Complaint  Patient presents with   Acute Visit    Cough    HPI:  Pt is a 88 y.o. female seen today for an acute visit for cough. Nursing state patient has had a cough for a few weeks that is not improving. Patient states she has had a cough but otherwise feels fine. She states she has no other concerns other than the cough. She has been receiving tussin without improvement. She is sitting at the breakfast table and prefers to talk here. She has had a good appetite. Denies sensation of fevers or chills.   She is a retired Pharmacist, hospital.    Past Medical History:  Diagnosis Date   Asthma    as a child   Bell's palsy    Diverticulosis    Environmental and seasonal allergies    Hyperlipidemia    Hypertension    IBS (irritable bowel syndrome)    Lower extremity edema    Macular degeneration, wet (HCC)    Migraines    Osteoporosis    TIA (transient ischemic attack)    Type 2 diabetes mellitus with polyneuropathy [facility])    Past Surgical History:  Procedure Laterality Date   CATARACT  EXTRACTION W/PHACO Left 05/29/2016   Procedure: CATARACT EXTRACTION PHACO AND INTRAOCULAR LENS PLACEMENT ([place]);  Surgeon: Estill Cotta, MD;  Location: [facility] ORS;  Service: Ophthalmology;  Laterality: Left;  [place]: 01:48.8 AP% 25.2 CDE: 50.41 [id number]   CHOLECYSTECTOMY     COLONOSCOPY     OOPHORECTOMY     TONSILLECTOMY      Allergies  Allergen Reactions   Erythromycin Diarrhea    Outpatient Encounter Medications as of 01/16/2022  Medication Sig   acetaminophen (TYLENOL) 325 MG tablet Take 975 mg by mouth every 6 (six) hours as needed.   albuterol (VENTOLIN HFA) 108 (90 Base) MCG/ACT inhaler Inhale 2 puffs into the lungs every 4 (four) hours as needed for wheezing or shortness of breath.   benzocaine (HURRICAINE) 20 % GEL Use as directed 1 Application in the mouth or throat every 2 (two) hours as needed.   cetirizine (ZYRTEC) 10 MG tablet Take 10 mg by mouth daily.   hydrocortisone (PROCTOZONE-HC) 2.5 % rectal cream Place 1 application  rectally every 12 (twelve) hours as needed for hemorrhoids.   hydroxypropyl methylcellulose / hypromellose (ISOPTO TEARS / GONIOVISC) 2.5 % ophthalmic solution Place 1 drop into both eyes 2 (two) times daily as needed for dry eyes.   Insulin Glargine (BASAGLAR KWIKPEN) 100 UNIT/ML Inject 45 Units into the skin at bedtime.   metFORMIN (GLUCOPHAGE-XR) 500  MG 24 hr tablet Take 1,000 mg by mouth daily with breakfast. at   montelukast (SINGULAIR) 10 MG tablet Take 10 mg by mouth at bedtime.   ondansetron (ZOFRAN) 4 MG tablet Take 4 mg by mouth every 8 (eight) hours as needed for nausea or vomiting.   polyethylene glycol (MIRALAX / GLYCOLAX) packet Take 17 g by mouth daily.   rosuvastatin (CRESTOR) 10 MG tablet Take 10 mg by mouth daily.   Skin Protectants, Misc. (EUCERIN) cream Apply 1 Application topically daily.   Vitamin D, Ergocalciferol, (DRISDOL) 1.25 MG (50000 UNIT) CAPS capsule Take 50,000 Units by mouth every 7 (seven) days. Wednesday   No  facility-administered encounter medications on file as of 01/16/2022.    Review of Systems  All other systems reviewed and are negative.   Immunization History  Administered Date(s) Administered   Influenza Inj Mdck Quad Pf 12/15/2017   Influenza, High Dose Seasonal PF 12/18/2021   Influenza-Unspecified 12/03/2018   MODERNA COVID-19 SARS-COV-2 PEDS BIVALENT BOOSTER 6Y-11Y 01/11/2022   Pneumococcal Conjugate-13 02/16/2015   Tdap 11/26/2013, 04/27/2019   Pertinent  Health Maintenance Due  Topic Date Due   FOOT EXAM  Never done   DEXA SCAN  Never done   OPHTHALMOLOGY EXAM  09/12/2020   HEMOGLOBIN A1C  07/03/2022   INFLUENZA VACCINE  Completed      04/27/2019    7:06 AM 04/27/2019    7:13 AM 07/17/2019    6:23 PM 07/22/2019    5:24 AM 08/13/2019    7:08 PM  Fall Risk  Patient Fall Risk Level Moderate fall risk High fall risk High fall risk High fall risk High fall risk   Functional Status Survey:    Vitals:   01/16/22 0925  BP: 131/70  Pulse: 87  Resp: 16  Temp: 98.4 F (36.9 C)  SpO2: 96%  Weight: 132 lb 3.2 oz (60 kg)  Height: '5\' 2"'$  (1.575 m)   Body mass index is 24.18 kg/m. Physical Exam Constitutional:      Appearance: Normal appearance.  Cardiovascular:     Rate and Rhythm: Normal rate.     Pulses: Normal pulses.     Heart sounds: Normal heart sounds.  Pulmonary:     Effort: Pulmonary effort is normal.     Breath sounds: Normal breath sounds.  Neurological:     General: No focal deficit present.     Mental Status: She is alert and oriented to person, place, and time.     Labs reviewed: Recent Labs    12/31/21 0000 01/02/22 0000  NA 141 139  K 4.1 4.3  CL 100 99  CO2 30* 27*  BUN 9 10  CREATININE 0.8 0.8  CALCIUM 9.6 9.5   Recent Labs    12/31/21 0000 01/02/22 0000  AST 21 23  ALT 12 16  ALKPHOS 89 91  ALBUMIN 4.2 4.0   Recent Labs    02/22/21 0000 12/31/21 0000 01/02/22 0000  WBC 7.6 7.5 10.4  NEUTROABS  --   --  7,592.00  HGB  12.6 14.3 14.5  HCT 39 43 44  PLT 78* 155 165   No results found for: "TSH" Lab Results  Component Value Date   HGBA1C 8.1 01/02/2022   Lab Results  Component Value Date   CHOL 114 12/31/2021   HDL 37 12/31/2021   LDLCALC 52 12/31/2021   TRIG 172 (A) 12/31/2021    Significant Diagnostic Results in last 30 days:  No results found.  Assessment/Plan 1.  Acute cough Patient has had a cough for the last two weeks without significant improvement. COVID negative. Will order a flu, CBC, and CXR to rule out infection based on McGeer's criteria. If no positive results, will defer starting treatment given patients stability.    Family/ staff Communication: nursing  Labs/tests ordered:  CBC, CXR  Tomasa Rand, MD, [place] [phone]

## 2022-01-17 LAB — CBC AND DIFFERENTIAL
HCT: 39 (ref 36–46)
Hemoglobin: 13 (ref 12.0–16.0)
Platelets: 136 10*3/uL — AB (ref 150–400)
WBC: 10.1

## 2022-01-17 LAB — CBC: RBC: 4.49 (ref 3.87–5.11)

## 2022-01-21 ENCOUNTER — Non-Acute Institutional Stay (SKILLED_NURSING_FACILITY): Payer: Medicare Other | Admitting: Student

## 2022-01-21 ENCOUNTER — Encounter: Payer: Self-pay | Admitting: Student

## 2022-01-21 DIAGNOSIS — M79675 Pain in left toe(s): Secondary | ICD-10-CM

## 2022-01-21 DIAGNOSIS — R296 Repeated falls: Secondary | ICD-10-CM | POA: Diagnosis not present

## 2022-01-21 DIAGNOSIS — G8929 Other chronic pain: Secondary | ICD-10-CM | POA: Diagnosis not present

## 2022-01-21 NOTE — Progress Notes (Signed)
Location:  Other Nursing Home Room Number: 409A Place of Service:  SNF (31) Provider:  Wynn Banker, Jordan Hawks, MD  Patient Care Team: Dewayne Shorter, MD as PCP - General [facility] Medicine)  Extended Emergency Contact Information Primary Emergency Contact: Long,Geoff Address: [address]          [place], [place] [address] Johnnette Litter of [place] Phone: [phone] Mobile Phone: [phone] Relation: Son Secondary Emergency Contact: Amparo Bristol Address: [address]          [place], [place] [address] Home Phone: [phone] Mobile Phone: [phone] Relation: Son  Code Status:  Full Code Goals of care: Advanced Directive information    01/16/2022    9:35 AM  Advanced Directives  Does Patient Have a Medical Advance Directive? No  Would patient like information on creating a medical advance directive? No - Patient declined     No chief complaint on file.   HPI:  Pt is a 88 y.o. female seen today for an acute visit for toe pain after a fall. Patient had a fall this weekend and continues to have toe tenderness.   Nursing states she has had difficulty with weightbearing secondary to pain   Past Medical History:  Diagnosis Date   Asthma    as a child   Bell's palsy    Diverticulosis    Environmental and seasonal allergies    Hyperlipidemia    Hypertension    IBS (irritable bowel syndrome)    Lower extremity edema    Macular degeneration, wet (HCC)    Migraines    Osteoporosis    TIA (transient ischemic attack)    Type 2 diabetes mellitus with polyneuropathy [facility])    Past Surgical History:  Procedure Laterality Date   CATARACT EXTRACTION W/PHACO Left 05/29/2016   Procedure: CATARACT EXTRACTION PHACO AND INTRAOCULAR LENS PLACEMENT ([place]);  Surgeon: Estill Cotta, MD;  Location: [facility] ORS;  Service: Ophthalmology;  Laterality: Left;  [place]: 01:48.8 AP% 25.2 CDE: 50.41 [id number]   CHOLECYSTECTOMY     COLONOSCOPY     OOPHORECTOMY      TONSILLECTOMY      Allergies  Allergen Reactions   Erythromycin Diarrhea    Outpatient Encounter Medications as of 01/21/2022  Medication Sig   acetaminophen (TYLENOL) 325 MG tablet Take 975 mg by mouth every 6 (six) hours as needed.   albuterol (VENTOLIN HFA) 108 (90 Base) MCG/ACT inhaler Inhale 2 puffs into the lungs every 4 (four) hours as needed for wheezing or shortness of breath.   benzocaine (HURRICAINE) 20 % GEL Use as directed 1 Application in the mouth or throat every 2 (two) hours as needed.   cetirizine (ZYRTEC) 10 MG tablet Take 10 mg by mouth daily.   hydrocortisone (PROCTOZONE-HC) 2.5 % rectal cream Place 1 application  rectally every 12 (twelve) hours as needed for hemorrhoids.   hydroxypropyl methylcellulose / hypromellose (ISOPTO TEARS / GONIOVISC) 2.5 % ophthalmic solution Place 1 drop into both eyes 2 (two) times daily as needed for dry eyes.   Insulin Glargine (BASAGLAR KWIKPEN) 100 UNIT/ML Inject 45 Units into the skin at bedtime.   metFORMIN (GLUCOPHAGE-XR) 500 MG 24 hr tablet Take 1,000 mg by mouth daily with breakfast. at   montelukast (SINGULAIR) 10 MG tablet Take 10 mg by mouth at bedtime.   ondansetron (ZOFRAN) 4 MG tablet Take 4 mg by mouth every 8 (eight) hours as needed for nausea or vomiting.   polyethylene glycol (MIRALAX / GLYCOLAX) packet Take 17 g by mouth  daily.   rosuvastatin (CRESTOR) 10 MG tablet Take 10 mg by mouth daily.   Skin Protectants, Misc. (EUCERIN) cream Apply 1 Application topically daily.   Vitamin D, Ergocalciferol, (DRISDOL) 1.25 MG (50000 UNIT) CAPS capsule Take 50,000 Units by mouth every 7 (seven) days. Wednesday   No facility-administered encounter medications on file as of 01/21/2022.    Review of Systems  Immunization History  Administered Date(s) Administered   Influenza Inj Mdck Quad Pf 12/15/2017   Influenza, High Dose Seasonal PF 12/18/2021   Influenza-Unspecified 12/03/2018   MODERNA COVID-19 SARS-COV-2 PEDS BIVALENT  BOOSTER 6Y-11Y 01/11/2022   Pneumococcal Conjugate-13 02/16/2015   Tdap 11/26/2013, 04/27/2019   Pertinent  Health Maintenance Due  Topic Date Due   FOOT EXAM  Never done   DEXA SCAN  Never done   OPHTHALMOLOGY EXAM  09/12/2020   HEMOGLOBIN A1C  07/03/2022   INFLUENZA VACCINE  Completed      04/27/2019    7:06 AM 04/27/2019    7:13 AM 07/17/2019    6:23 PM 07/22/2019    5:24 AM 08/13/2019    7:08 PM  Fall Risk  Patient Fall Risk Level Moderate fall risk High fall risk High fall risk High fall risk High fall risk   Functional Status Survey:    Vitals:   01/02/22 1659  BP: 123/68  Pulse: 93  Resp: 18  Temp: 97.9 F (36.6 C)  SpO2: 93%   There is no height or weight on file to calculate BMI. Physical Exam Skin:    Comments: Left second toe with bruising. No erythema, redness. Tender to palpation of left second toe.   Neurological:     Mental Status: She is alert.     Labs reviewed: Recent Labs    12/31/21 0000 01/02/22 0000  NA 141 139  K 4.1 4.3  CL 100 99  CO2 30* 27*  BUN 9 10  CREATININE 0.8 0.8  CALCIUM 9.6 9.5   Recent Labs    12/31/21 0000 01/02/22 0000  AST 21 23  ALT 12 16  ALKPHOS 89 91  ALBUMIN 4.2 4.0   Recent Labs    02/22/21 0000 12/31/21 0000 01/02/22 0000  WBC 7.6 7.5 10.4  NEUTROABS  --   --  7,592.00  HGB 12.6 14.3 14.5  HCT 39 43 44  PLT 78* 155 165   No results found for: "TSH" Lab Results  Component Value Date   HGBA1C 8.1 01/02/2022   Lab Results  Component Value Date   CHOL 114 12/31/2021   HDL 37 12/31/2021   LDLCALC 52 12/31/2021   TRIG 172 (A) 12/31/2021    Significant Diagnostic Results in last 30 days:  No results found.  Assessment/Plan 1. Toe pain, chronic, left X-ray of left foot negative for fracture. Encourage rest, ice, elevation of foot given tenderness. No redness, warmth, etc of the joint to consider gout at this time. Continue to monitor.   2. Recurrent falls Medications appropriate and  unlikely to contribute to falls. Physical therapy as tolerated.     Family/ staff Communication: nursing  Labs/tests ordered:  none.

## 2022-01-22 LAB — HM DIABETES FOOT EXAM

## 2022-01-29 ENCOUNTER — Encounter: Payer: Self-pay | Admitting: Nurse Practitioner

## 2022-01-29 ENCOUNTER — Non-Acute Institutional Stay (SKILLED_NURSING_FACILITY): Payer: Medicare Other | Admitting: Nurse Practitioner

## 2022-01-29 DIAGNOSIS — J40 Bronchitis, not specified as acute or chronic: Secondary | ICD-10-CM | POA: Diagnosis not present

## 2022-01-29 NOTE — Progress Notes (Signed)
Location:  Other [facility]) Nursing Home Room Number: 409-A Place of Service:  SNF [phone]) Provider:  Carlos American. Dewaine Oats, NP    Patient Care Team: Dewayne Shorter, MD as PCP - General [facility] Medicine)  Extended Emergency Contact Information Primary Emergency Contact: Herman,Geoff Address: [address]          [place], [place] [address] Johnnette Litter of [place] Phone: [phone] Mobile Phone: [phone] Relation: Son Secondary Emergency Contact: Amparo Bristol Address: [address]          [place], [place] [address] Home Phone: [phone] Mobile Phone: [phone] Relation: Son  Code Status:  Full Code  Goals of care: Advanced Directive information    01/29/2022    4:40 PM  Advanced Directives  Does Patient Have a Medical Advance Directive? No  Would patient like information on creating a medical advance directive? No - Patient declined     Chief Complaint  Patient presents with   Acute Visit    Not feeling well. Vitals and medications are a reflection of [facility]MR system, [employer]      HPI:  Pt is a 88 y.o. female seen today for an acute visit for not feeling well. Pt reports she has had a cough for weeks and now feeling achy all over. Staff reports she is staying in room more, sleeping more and decrease appetite. Pt denies shortness of breath, chest pains, fever, but continues to say she does not feel well.    Past Medical History:  Diagnosis Date   Asthma    as a child   Bell's palsy    Diverticulosis    Environmental and seasonal allergies    Hyperlipidemia    Hypertension    IBS (irritable bowel syndrome)    Lower extremity edema    Macular degeneration, wet (HCC)    Migraines    Osteoporosis    TIA (transient ischemic attack)    Type 2 diabetes mellitus with polyneuropathy [facility])    Past Surgical History:  Procedure Laterality Date   CATARACT EXTRACTION W/PHACO Left 05/29/2016   Procedure: CATARACT EXTRACTION PHACO AND  INTRAOCULAR LENS PLACEMENT ([place]);  Surgeon: Estill Cotta, MD;  Location: [facility] ORS;  Service: Ophthalmology;  Laterality: Left;  [place]: 01:48.8 AP% 25.2 CDE: 50.41 [id number]   CHOLECYSTECTOMY     COLONOSCOPY     OOPHORECTOMY     TONSILLECTOMY      Allergies  Allergen Reactions   Erythromycin Diarrhea    Outpatient Encounter Medications as of 01/29/2022  Medication Sig   acetaminophen (TYLENOL) 325 MG tablet Take 975 mg by mouth every 6 (six) hours as needed.   albuterol (VENTOLIN HFA) 108 (90 Base) MCG/ACT inhaler Inhale 2 puffs into the lungs every 4 (four) hours as needed for wheezing or shortness of breath.   benzocaine (HURRICAINE) 20 % GEL Use as directed 1 Application in the mouth or throat every 2 (two) hours as needed.   cetirizine (ZYRTEC) 10 MG tablet Take 10 mg by mouth daily.   guaifenesin (ROBITUSSIN) 100 MG/5ML syrup Take 200 mg by mouth every 4 (four) hours as needed for cough.   hydrocortisone (PROCTOZONE-HC) 2.5 % rectal cream Place 1 application  rectally every 12 (twelve) hours as needed for hemorrhoids.   hydroxypropyl methylcellulose / hypromellose (ISOPTO TEARS / GONIOVISC) 2.5 % ophthalmic solution Place 1 drop into both eyes 2 (two) times daily as needed for dry eyes.   Insulin Glargine (BASAGLAR KWIKPEN) 100 UNIT/ML Inject 45 Units into  the skin at bedtime.   metFORMIN (GLUCOPHAGE-XR) 500 MG 24 hr tablet Take 1,000 mg by mouth daily with breakfast. at   montelukast (SINGULAIR) 10 MG tablet Take 10 mg by mouth at bedtime.   ondansetron (ZOFRAN) 4 MG tablet Take 4 mg by mouth every 8 (eight) hours as needed for nausea or vomiting.   polyethylene glycol (MIRALAX / GLYCOLAX) packet Take 17 g by mouth daily.   rosuvastatin (CRESTOR) 10 MG tablet Take 10 mg by mouth daily.   Skin Protectants, Misc. (EUCERIN) cream Apply 1 Application topically daily.   Vitamin D, Ergocalciferol, (DRISDOL) 1.25 MG (50000 UNIT) CAPS capsule Take 50,000 Units by mouth every 7  (seven) days. Wednesday   No facility-administered encounter medications on file as of 01/29/2022.    Review of Systems  Constitutional:  Positive for appetite change and fatigue. Negative for activity change and unexpected weight change.  HENT:  Negative for congestion and hearing loss.   Eyes: Negative.   Respiratory:  Positive for cough. Negative for shortness of breath.   Cardiovascular:  Negative for chest pain, palpitations and leg swelling.  Gastrointestinal:  Negative for abdominal pain, constipation and diarrhea.  Genitourinary:  Negative for difficulty urinating and dysuria.  Musculoskeletal:  Negative for arthralgias and myalgias.  Skin:  Negative for color change and wound.  Neurological:  Negative for dizziness and weakness.  Psychiatric/Behavioral:  Negative for agitation, behavioral problems and confusion.     Immunization History  Administered Date(s) Administered   Influenza Inj Mdck Quad Pf 12/15/2017   Influenza, High Dose Seasonal PF 12/18/2021   Influenza-Unspecified 12/03/2018   MODERNA COVID-19 SARS-COV-2 PEDS BIVALENT BOOSTER 6Y-11Y 01/11/2022   Pneumococcal Conjugate-13 02/16/2015   Tdap 11/26/2013, 04/27/2019   Pertinent  Health Maintenance Due  Topic Date Due   FOOT EXAM  Never done   DEXA SCAN  Never done   OPHTHALMOLOGY EXAM  09/12/2020   HEMOGLOBIN A1C  07/03/2022   INFLUENZA VACCINE  Completed      04/27/2019    7:06 AM 04/27/2019    7:13 AM 07/17/2019    6:23 PM 07/22/2019    5:24 AM 08/13/2019    7:08 PM  Fall Risk  Patient Fall Risk Level Moderate fall risk High fall risk High fall risk High fall risk High fall risk   Functional Status Survey:    Vitals:   01/29/22 1635  BP: 123/68  Pulse: 93  Weight: 132 lb 3.2 oz (60 kg)  Height: '5\' 2"'$  (1.575 m)   Body mass index is 24.18 kg/m. Physical Exam Constitutional:      General: She is not in acute distress.    Appearance: She is well-developed. She is not diaphoretic.  HENT:      Head: Normocephalic and atraumatic.     Mouth/Throat:     Pharynx: No oropharyngeal exudate.  Eyes:     Conjunctiva/sclera: Conjunctivae normal.     Pupils: Pupils are equal, round, and reactive to light.  Cardiovascular:     Rate and Rhythm: Normal rate and regular rhythm.     Heart sounds: Normal heart sounds.  Pulmonary:     Effort: Pulmonary effort is normal.     Breath sounds: Normal breath sounds.  Abdominal:     General: Bowel sounds are normal.     Palpations: Abdomen is soft.  Musculoskeletal:     Cervical back: Normal range of motion and neck supple.     Right lower leg: No edema.     Left  lower leg: No edema.  Skin:    General: Skin is warm and dry.  Neurological:     Mental Status: She is alert.  Psychiatric:        Mood and Affect: Mood normal.     Labs reviewed: Recent Labs    12/31/21 0000 01/02/22 0000  NA 141 139  K 4.1 4.3  CL 100 99  CO2 30* 27*  BUN 9 10  CREATININE 0.8 0.8  CALCIUM 9.6 9.5   Recent Labs    12/31/21 0000 01/02/22 0000  AST 21 23  ALT 12 16  ALKPHOS 89 91  ALBUMIN 4.2 4.0   Recent Labs    12/31/21 0000 01/02/22 0000 01/17/22 0000  WBC 7.5 10.4 10.1  NEUTROABS  --  7,592.00  --   HGB 14.3 14.5 13.0  HCT 43 44 39  PLT 155 165 136*   No results found for: "TSH" Lab Results  Component Value Date   HGBA1C 8.1 01/02/2022   Lab Results  Component Value Date   CHOL 114 12/31/2021   HDL 37 12/31/2021   LDLCALC 52 12/31/2021   TRIG 172 (A) 12/31/2021    Significant Diagnostic Results in last 30 days:  No results found.  Assessment/Plan 1. Bronchitis -to start doxycycline 100 mg BID for 7 days Continue cough medication PRN Increase hydration and encourage proper nutrition.  Notify for worsening symptoms or change in condition.   Carlos American. [place], [facility] [phone]

## 2022-03-01 ENCOUNTER — Non-Acute Institutional Stay (SKILLED_NURSING_FACILITY): Payer: Medicare Other | Admitting: Student

## 2022-03-01 ENCOUNTER — Encounter: Payer: Self-pay | Admitting: Student

## 2022-03-01 DIAGNOSIS — J4520 Mild intermittent asthma, uncomplicated: Secondary | ICD-10-CM

## 2022-03-01 DIAGNOSIS — I10 Essential (primary) hypertension: Secondary | ICD-10-CM | POA: Diagnosis not present

## 2022-03-01 DIAGNOSIS — I700 Atherosclerosis of aorta: Secondary | ICD-10-CM

## 2022-03-01 DIAGNOSIS — G459 Transient cerebral ischemic attack, unspecified: Secondary | ICD-10-CM | POA: Diagnosis not present

## 2022-03-01 DIAGNOSIS — E1142 Type 2 diabetes mellitus with diabetic polyneuropathy: Secondary | ICD-10-CM

## 2022-03-01 DIAGNOSIS — S065XAA Traumatic subdural hemorrhage with loss of consciousness status unknown, initial encounter: Secondary | ICD-10-CM

## 2022-03-01 NOTE — Progress Notes (Addendum)
Location:  Other [place].  Nursing Home Room Number: [facility] 409A Place of Service:  SNF [phone]) Provider:  Dr. Amada Kingfisher, MD  Patient Care Team: Dewayne Shorter, MD as PCP - General [facility] Medicine)  Extended Emergency Contact Information Primary Emergency Contact: Reichow,Geoff Address: [address]          [place], [place] [address] Johnnette Litter of [place] Phone: [phone] Mobile Phone: [phone] Relation: Son Secondary Emergency Contact: Amparo Bristol Address: [address]          [place], [place] [address] Home Phone: [phone] Mobile Phone: [phone] Relation: Son  Code Status:  Full Code Goals of care: Advanced Directive information    03/01/2022    8:48 AM  Advanced Directives  Does Patient Have a Medical Advance Directive? No  Would patient like information on creating a medical advance directive? No - Patient declined     Chief Complaint  Patient presents with   Medical Management of Chronic Issues    Medical Management of Chronic Issues.     HPI:  Pt is a 88 y.o. female seen today for medical management of chronic diseases.  Patient states that she panics easily - but she tells her self to calm down and things improve. She is feeling nervous.   She is oriented to self. Disoriented to time. She knows we are in her room in [place]. She has family nearby - her children Jeanmarie Hubert, Mallie Mussel, [place] (these are children, but she doesn't know who they are." She also has a son named geoff.   "You are asking too many questions" She says if she only had a brain. Resolutions are a waste of time. She used to have little "poppers" for new years.    Past Medical History:  Diagnosis Date   Asthma    as a child   Bell's palsy    Diverticulosis    Environmental and seasonal allergies    Hyperlipidemia    Hypertension    IBS (irritable bowel syndrome)    Lower extremity edema    Macular degeneration,  wet (HCC)    Migraines    Osteoporosis    TIA (transient ischemic attack)    Type 2 diabetes mellitus with polyneuropathy [facility])    Past Surgical History:  Procedure Laterality Date   CATARACT EXTRACTION W/PHACO Left 05/29/2016   Procedure: CATARACT EXTRACTION PHACO AND INTRAOCULAR LENS PLACEMENT ([place]);  Surgeon: Estill Cotta, MD;  Location: [facility] ORS;  Service: Ophthalmology;  Laterality: Left;  [place]: 01:48.8 AP% 25.2 CDE: 50.41 OIB#[id number] H   CHOLECYSTECTOMY     COLONOSCOPY     OOPHORECTOMY     TONSILLECTOMY      Allergies  Allergen Reactions   Erythromycin Diarrhea    Outpatient Encounter Medications as of 03/01/2022  Medication Sig   acetaminophen (TYLENOL) 325 MG tablet Take 975 mg by mouth every 6 (six) hours as needed.   albuterol (VENTOLIN HFA) 108 (90 Base) MCG/ACT inhaler Inhale 2 puffs into the lungs every 4 (four) hours as needed for wheezing or shortness of breath.   alum & mag hydroxide-simeth (MAALOX/MYLANTA) 200-200-20 MG/5ML suspension Take 30 mLs by mouth every 4 (four) hours as needed for indigestion or heartburn.   benzocaine (HURRICAINE) 20 % GEL Use as directed 1 Application in the mouth or throat every 2 (two) hours as needed.   cetirizine (ZYRTEC) 10 MG tablet Take 10 mg by mouth daily.   guaifenesin (ROBITUSSIN) 100 MG/5ML syrup Take 200  mg by mouth every 4 (four) hours as needed for cough.   hydrocortisone (PROCTOZONE-HC) 2.5 % rectal cream Place 1 application  rectally every 12 (twelve) hours as needed for hemorrhoids.   hydroxypropyl methylcellulose / hypromellose (ISOPTO TEARS / GONIOVISC) 2.5 % ophthalmic solution Place 1 drop into both eyes 2 (two) times daily as needed for dry eyes.   Insulin Glargine (BASAGLAR KWIKPEN) 100 UNIT/ML Inject 45 Units into the skin at bedtime.   metFORMIN (GLUCOPHAGE-XR) 500 MG 24 hr tablet Take 1,000 mg by mouth daily with breakfast. at   montelukast (SINGULAIR) 10 MG tablet Take 10 mg by mouth at bedtime.    ondansetron (ZOFRAN) 4 MG tablet Take 4 mg by mouth every 8 (eight) hours as needed for nausea or vomiting.   polyethylene glycol (MIRALAX / GLYCOLAX) packet Take 17 g by mouth daily.   rosuvastatin (CRESTOR) 10 MG tablet Take 10 mg by mouth daily.   Skin Protectants, Misc. (EUCERIN) cream Apply 1 Application topically daily.   Vitamin D, Ergocalciferol, (DRISDOL) 1.25 MG (50000 UNIT) CAPS capsule Take 50,000 Units by mouth every 7 (seven) days. Wednesday   No facility-administered encounter medications on file as of 03/01/2022.    Review of Systems  All other systems reviewed and are negative.   Immunization History  Administered Date(s) Administered   Influenza Inj Mdck Quad Pf 12/15/2017   Influenza, High Dose Seasonal PF 12/18/2021   Influenza-Unspecified 12/03/2018   MODERNA COVID-19 SARS-COV-2 PEDS BIVALENT BOOSTER 6Y-11Y 01/11/2022   Pneumococcal Conjugate-13 02/16/2015   Tdap 11/26/2013, 04/27/2019   Pertinent  Health Maintenance Due  Topic Date Due   FOOT EXAM  Never done   DEXA SCAN  Never done   OPHTHALMOLOGY EXAM  09/12/2020   HEMOGLOBIN A1C  07/03/2022   INFLUENZA VACCINE  Completed      04/27/2019    7:06 AM 04/27/2019    7:13 AM 07/17/2019    6:23 PM 07/22/2019    5:24 AM 08/13/2019    7:08 PM  Fall Risk  Patient Fall Risk Level Moderate fall risk High fall risk High fall risk High fall risk High fall risk   Functional Status Survey:    Vitals:   03/01/22 0834  BP: (!) 161/82  Pulse: 90  Resp: (!) 22  Temp: 97.9 F (36.6 C)  SpO2: 97%  Weight: 128 lb 3.2 oz (58.2 kg)  Height: '5\' 2"'$  (1.575 m)   Body mass index is 23.45 kg/m. Physical Exam Vitals reviewed.  Constitutional:      Appearance: Normal appearance.  Cardiovascular:     Rate and Rhythm: Normal rate and regular rhythm.     Pulses: Normal pulses.  Pulmonary:     Effort: Pulmonary effort is normal.     Breath sounds: Normal breath sounds.  Abdominal:     General: Bowel sounds are  normal.     Palpations: Abdomen is soft.  Skin:    General: Skin is warm and dry.  Neurological:     Mental Status: She is alert. Mental status is at baseline.     Comments: Disoriented to time, knows she is in [place] and currently in her room, but otherwise cannot provide details.      Labs reviewed: Recent Labs    12/31/21 0000 01/02/22 0000  NA 141 139  K 4.1 4.3  CL 100 99  CO2 30* 27*  BUN 9 10  CREATININE 0.8 0.8  CALCIUM 9.6 9.5   Recent Labs    12/31/21  0000 01/02/22 0000  AST 21 23  ALT 12 16  ALKPHOS 89 91  ALBUMIN 4.2 4.0   Recent Labs    12/31/21 0000 01/02/22 0000 01/17/22 0000  WBC 7.5 10.4 10.1  NEUTROABS  --  7,592.00  --   HGB 14.3 14.5 13.0  HCT 43 44 39  PLT 155 165 136*   No results found for: "TSH" Lab Results  Component Value Date   HGBA1C 8.1 01/02/2022   Lab Results  Component Value Date   CHOL 114 12/31/2021   HDL 37 12/31/2021   LDLCALC 52 12/31/2021   TRIG 172 (A) 12/31/2021    Significant Diagnostic Results in last 30 days:  No results found.  Assessment/Plan 1. Primary hypertension Patient with slightly elevated blood pressure today. 90% of her blood pressures are well-controlled. Will defer medication adjustments. Will continue to monitor blood pressure. Patient without medications at this time. If persistently elevated, will consider initiating therapy.   2. TIA (transient ischemic attack) 3. Atherosclerosis of aorta [facility]) Patient without symptoms at this time. Continue crestor 10 mg daily. Will also ned continued goals of care conversations with family members regarding future interventions.   4. Mild intermittent asthma without complication No symptoms at this time. Continue PRN albuterol and singulair.   5. Type 2 diabetes mellitus with polyneuropathy ([place]) Most recent A1c slightly above goal. Based on age, goal is <8. Continue Metformin '1000mg'$  daily, can consider increasing to '1000MG'$  BID if patient's A1c  increases with next interval check. Continue glargine 45 units nightly.   Hemoglobin A1C  Date/Time Value Ref Range Status  01/02/2022 12:00 AM 8.1  Final  ] 6. SDH (subdural hematoma) ([place]) 07/23/2019 No signs or symptoms of mental status changes. No recent falls. No anticoagulation or antiplatelet therapy.     Family/ staff Communication: nursing  Labs/tests ordered:  CBC, BMP

## 2022-03-18 ENCOUNTER — Non-Acute Institutional Stay (SKILLED_NURSING_FACILITY): Payer: Medicare Other | Admitting: Student

## 2022-03-18 ENCOUNTER — Encounter: Payer: Self-pay | Admitting: Student

## 2022-03-18 DIAGNOSIS — M25511 Pain in right shoulder: Secondary | ICD-10-CM | POA: Diagnosis not present

## 2022-03-18 DIAGNOSIS — R296 Repeated falls: Secondary | ICD-10-CM | POA: Diagnosis not present

## 2022-03-18 DIAGNOSIS — R0781 Pleurodynia: Secondary | ICD-10-CM

## 2022-03-18 NOTE — Progress Notes (Unsigned)
Location:  Other [place].  Nursing Home Room Number: [facility] 409A Place of Service:  SNF [phone]) Provider:  Dewayne Shorter, MD  Patient Care Team: Dewayne Shorter, MD as PCP - General [facility])  Extended Emergency Contact Information Primary Emergency Contact: Heathcock,Geoff Address: [address]          [place], [place] [address] Johnnette Litter of [place] Phone: [phone] Mobile Phone: [phone] Relation: Son Secondary Emergency Contact: Amparo Bristol Address: [address]          [place], [place] [address] Home Phone: [phone] Mobile Phone: [phone] Relation: Son  Code Status:  Full Code.  Goals of care: Advanced Directive information    03/18/2022    1:52 PM  Advanced Directives  Does Patient Have a Medical Advance Directive? No  Would patient like information on creating a medical advance directive? No - Patient declined     Chief Complaint  Patient presents with   Acute Visit    Fall    HPI:  Pt is a 89 y.o. female seen today for an acute visit for evaluation after a fall. Patient doesn't remember the specifics of the fall, but states she has some right sided rib pain and tenderness in the shoulder.    Past Medical History:  Diagnosis Date   Asthma    as a child   Bell's palsy    Diverticulosis    Environmental and seasonal allergies    Hyperlipidemia    Hypertension    IBS (irritable bowel syndrome)    Lower extremity edema    Macular degeneration, wet (HCC)    Migraines    Osteoporosis    TIA (transient ischemic attack)    Type 2 diabetes mellitus with polyneuropathy [facility])    Past Surgical History:  Procedure Laterality Date   CATARACT EXTRACTION W/PHACO Left 05/29/2016   Procedure: CATARACT EXTRACTION PHACO AND INTRAOCULAR LENS PLACEMENT ([place]);  Surgeon: Estill Cotta, MD;  Location: [facility] ORS;  Service: Ophthalmology;  Laterality: Left;  [place]: 01:48.8 AP% 25.2 CDE: 50.41 OAC#[id number] H   CHOLECYSTECTOMY      COLONOSCOPY     OOPHORECTOMY     TONSILLECTOMY      Allergies  Allergen Reactions   Erythromycin Diarrhea    Outpatient Encounter Medications as of 03/18/2022  Medication Sig   acetaminophen (TYLENOL) 325 MG tablet Take 975 mg by mouth every 6 (six) hours as needed.   albuterol (VENTOLIN HFA) 108 (90 Base) MCG/ACT inhaler Inhale 2 puffs into the lungs every 4 (four) hours as needed for wheezing or shortness of breath.   alum & mag hydroxide-simeth (MAALOX/MYLANTA) 200-200-20 MG/5ML suspension Take 30 mLs by mouth every 4 (four) hours as needed for indigestion or heartburn.   benzocaine (HURRICAINE) 20 % GEL Use as directed 1 Application in the mouth or throat every 2 (two) hours as needed.   cetirizine (ZYRTEC) 10 MG tablet Take 10 mg by mouth daily.   guaifenesin (ROBITUSSIN) 100 MG/5ML syrup Take 10 mLs by mouth every 4 (four) hours as needed for cough.   hydrocortisone (PROCTOZONE-HC) 2.5 % rectal cream Place 1 application  rectally every 12 (twelve) hours as needed for hemorrhoids.   hydroxypropyl methylcellulose / hypromellose (ISOPTO TEARS / GONIOVISC) 2.5 % ophthalmic solution Place 1 drop into both eyes 2 (two) times daily as needed for dry eyes.   Insulin Glargine (BASAGLAR KWIKPEN) 100 UNIT/ML Inject 45 Units into the skin at bedtime.   metFORMIN (GLUCOPHAGE-XR) 500 MG 24 hr tablet Take 1,000  mg by mouth daily with breakfast. at   montelukast (SINGULAIR) 10 MG tablet Take 10 mg by mouth at bedtime.   ondansetron (ZOFRAN) 4 MG tablet Take 4 mg by mouth every 8 (eight) hours as needed for nausea or vomiting.   polyethylene glycol (MIRALAX / GLYCOLAX) packet Take 17 g by mouth daily.   rosuvastatin (CRESTOR) 10 MG tablet Take 10 mg by mouth daily.   Skin Protectants, Misc. (EUCERIN) cream Apply 1 Application topically daily.   Vitamin D, Ergocalciferol, (DRISDOL) 1.25 MG (50000 UNIT) CAPS capsule Take 50,000 Units by mouth every 7 (seven) days. Wednesday   No facility-administered  encounter medications on file as of 03/18/2022.    Review of Systems  All other systems reviewed and are negative.   Immunization History  Administered Date(s) Administered   Influenza Inj Mdck Quad Pf 12/15/2017   Influenza, High Dose Seasonal PF 12/18/2021   Influenza-Unspecified 12/03/2018, 12/18/2020   MODERNA COVID-19 SARS-COV-2 PEDS BIVALENT BOOSTER 6Y-11Y 07/31/2021   Moderna Covid-19 Vaccine Bivalent Booster 14yr & up 07/31/2021, 01/11/2022   Moderna Sars-Covid-2 Vaccination 03/15/2019, 04/12/2019, 01/14/2020, 07/21/2020, 11/24/2020   Pneumococcal Conjugate-13 02/16/2015   Tdap 11/26/2013, 04/27/2019   Pertinent  Health Maintenance Due  Topic Date Due   FOOT EXAM  Never done   DEXA SCAN  Never done   OPHTHALMOLOGY EXAM  09/12/2020   HEMOGLOBIN A1C  07/03/2022   INFLUENZA VACCINE  Completed      04/27/2019    7:06 AM 04/27/2019    7:13 AM 07/17/2019    6:23 PM 07/22/2019    5:24 AM 08/13/2019    7:08 PM  Fall Risk  (RETIRED) Patient Fall Risk Level Moderate fall risk High fall risk High fall risk High fall risk High fall risk   Functional Status Survey:    Vitals:   03/18/22 1309  BP: 120/62  Pulse: 87  Resp: 16  Temp: (!) 97.5 F (36.4 C)  SpO2: 93%  Weight: 132 lb 12.8 oz (60.2 kg)  Height: '5\' 2"'$  (1.575 m)   Body mass index is 24.29 kg/m. Physical Exam Constitutional:      Appearance: Normal appearance.  Cardiovascular:     Rate and Rhythm: Normal rate and regular rhythm.     Pulses: Normal pulses.  Pulmonary:     Effort: Pulmonary effort is normal.     Breath sounds: Normal breath sounds.  Chest:     Chest wall: Tenderness present.  Abdominal:     General: Bowel sounds are normal.     Palpations: Abdomen is soft.  Skin:    General: Skin is warm and dry.  Neurological:     Mental Status: She is alert.     Labs reviewed: Recent Labs    12/31/21 0000 01/02/22 0000  NA 141 139  K 4.1 4.3  CL 100 99  CO2 30* 27*  BUN 9 10  CREATININE  0.8 0.8  CALCIUM 9.6 9.5   Recent Labs    12/31/21 0000 01/02/22 0000  AST 21 23  ALT 12 16  ALKPHOS 89 91  ALBUMIN 4.2 4.0   Recent Labs    12/31/21 0000 01/02/22 0000 01/17/22 0000  WBC 7.5 10.4 10.1  NEUTROABS  --  7,592.00  --   HGB 14.3 14.5 13.0  HCT 43 44 39  PLT 155 165 136*   No results found for: "TSH" Lab Results  Component Value Date   HGBA1C 8.1 01/02/2022   Lab Results  Component Value Date  CHOL 114 12/31/2021   HDL 37 12/31/2021   LDLCALC 52 12/31/2021   TRIG 172 (A) 12/31/2021    Significant Diagnostic Results in last 30 days:  No results found.  Assessment/Plan Rib pain  Acute pain of right shoulder  Recurrent falls Patient with fall over the weekend. She endorses some right-sided rib pain as well as some tenderness in the right shoulder will order CXR -rib series and right shoulder x-ray to determine if there is an underlying injury. Meds reviewed, the only medication that could contribute to fall risk is insulin. Patient has had glucose >80 for all glucose checks in the ast month. Most ranging 100-140. Will continue to monitor glucose and adjust medication as indicated to prevent falls.    Family/ staff Communication: nursing  Labs/tests ordered:  none

## 2022-04-25 ENCOUNTER — Encounter: Payer: Self-pay | Admitting: Nurse Practitioner

## 2022-04-25 ENCOUNTER — Non-Acute Institutional Stay (SKILLED_NURSING_FACILITY): Payer: Medicare Other | Admitting: Nurse Practitioner

## 2022-04-25 DIAGNOSIS — J4520 Mild intermittent asthma, uncomplicated: Secondary | ICD-10-CM | POA: Diagnosis not present

## 2022-04-25 DIAGNOSIS — E781 Pure hyperglyceridemia: Secondary | ICD-10-CM | POA: Diagnosis not present

## 2022-04-25 DIAGNOSIS — F01A4 Vascular dementia, mild, with anxiety: Secondary | ICD-10-CM

## 2022-04-25 DIAGNOSIS — E1142 Type 2 diabetes mellitus with diabetic polyneuropathy: Secondary | ICD-10-CM

## 2022-04-25 DIAGNOSIS — I10 Essential (primary) hypertension: Secondary | ICD-10-CM

## 2022-04-25 DIAGNOSIS — F419 Anxiety disorder, unspecified: Secondary | ICD-10-CM

## 2022-04-25 NOTE — Progress Notes (Signed)
Location:  Other [facility]) Nursing Home Room Number: [id number] Place of Service:  SNF [phone]) Provider:  Micah Flesher, MD  Patient Care Team: Dewayne Shorter, MD as PCP - General [facility] Medicine)  Extended Emergency Contact Information Primary Emergency Contact: Riva,Geoff Address: [address]          [place], [place] [address] Johnnette Litter of [place] Phone: [phone] Mobile Phone: [phone] Relation: Son Secondary Emergency Contact: Amparo Bristol Address: [address]          [place], [place] [address] Home Phone: [phone] Mobile Phone: [phone] Relation: Son  Code Status:  Full Code Goals of care: Advanced Directive information    04/25/2022    9:16 AM  Advanced Directives  Does Patient Have a Medical Advance Directive? No  Would patient like information on creating a medical advance directive? No - Patient declined     Chief Complaint  Patient presents with   Routine    HPI:  Pt is a 89 y.o. female seen today for medical management of chronic diseases.   Pt reports she feels panic a lot. No specific trigger just feeling overall anxious.  Sleep well at night.   Had asthma as a child but no ongoing issues.   A1c was 8.1 in November- has tried to cut back on her sweets.  Using sugar free.  No hypoglycemia.   No constipation.   Denies pain  Eats well.    Past Medical History:  Diagnosis Date   Asthma    as a child   Bell's palsy    Diverticulosis    Environmental and seasonal allergies    Hyperlipidemia    Hypertension    IBS (irritable bowel syndrome)    Lower extremity edema    Macular degeneration, wet (HCC)    Migraines    Osteoporosis    TIA (transient ischemic attack)    Type 2 diabetes mellitus with polyneuropathy [facility])    Past Surgical History:  Procedure Laterality Date   CATARACT EXTRACTION W/PHACO Left 05/29/2016   Procedure: CATARACT EXTRACTION PHACO AND INTRAOCULAR LENS PLACEMENT  ([place]);  Surgeon: Estill Cotta, MD;  Location: [facility] ORS;  Service: Ophthalmology;  Laterality: Left;  [place]: 01:48.8 AP% 25.2 CDE: 50.41 [id number]   CHOLECYSTECTOMY     COLONOSCOPY     OOPHORECTOMY     TONSILLECTOMY      Allergies  Allergen Reactions   Erythromycin Diarrhea    Outpatient Encounter Medications as of 04/25/2022  Medication Sig   acetaminophen (TYLENOL) 325 MG tablet Take 975 mg by mouth every 6 (six) hours as needed.   albuterol (VENTOLIN HFA) 108 (90 Base) MCG/ACT inhaler Inhale 2 puffs into the lungs every 4 (four) hours as needed for wheezing or shortness of breath.   alum & mag hydroxide-simeth (MAALOX/MYLANTA) 200-200-20 MG/5ML suspension Take 30 mLs by mouth every 4 (four) hours as needed for indigestion or heartburn.   Artificial Tears ophthalmic solution Place 1 drop into both eyes every 12 (twelve) hours as needed.   benzocaine (HURRICAINE) 20 % GEL Use as directed 1 Application in the mouth or throat every 2 (two) hours as needed.   cetirizine (ZYRTEC) 10 MG tablet Take 10 mg by mouth daily.   guaifenesin (ROBITUSSIN) 100 MG/5ML syrup Take 10 mLs by mouth every 4 (four) hours as needed for cough.   hydrocortisone (ANUSOL-HC) 2.5 % rectal cream Place 1 Application rectally 2 (two) times daily.   Insulin Glargine (BASAGLAR KWIKPEN) 100 UNIT/ML Inject  45 Units into the skin at bedtime.   metFORMIN (GLUCOPHAGE-XR) 500 MG 24 hr tablet Take 1,000 mg by mouth daily with breakfast. at   montelukast (SINGULAIR) 10 MG tablet Take 10 mg by mouth at bedtime.   ondansetron (ZOFRAN) 4 MG tablet Take 4 mg by mouth every 8 (eight) hours as needed for nausea or vomiting.   polyethylene glycol (MIRALAX / GLYCOLAX) packet Take 17 g by mouth daily.   rosuvastatin (CRESTOR) 10 MG tablet Take 10 mg by mouth daily.   Skin Protectants, Misc. (EUCERIN) cream Apply 1 Application topically daily.   Vitamin D, Ergocalciferol, (DRISDOL) 1.25 MG (50000 UNIT) CAPS capsule Take 50,000  Units by mouth every 7 (seven) days. Wednesday   [DISCONTINUED] hydrocortisone (PROCTOZONE-HC) 2.5 % rectal cream Place 1 application  rectally every 12 (twelve) hours as needed for hemorrhoids.   [DISCONTINUED] hydroxypropyl methylcellulose / hypromellose (ISOPTO TEARS / GONIOVISC) 2.5 % ophthalmic solution Place 1 drop into both eyes 2 (two) times daily as needed for dry eyes.   No facility-administered encounter medications on file as of 04/25/2022.    Review of Systems  Immunization History  Administered Date(s) Administered   Influenza Inj Mdck Quad Pf 12/15/2017   Influenza, High Dose Seasonal PF 12/18/2021   Influenza-Unspecified 12/03/2018, 12/18/2020   MODERNA COVID-19 SARS-COV-2 PEDS BIVALENT BOOSTER 6Y-11Y 07/31/2021   Moderna Covid-19 Vaccine Bivalent Booster 73yr & up 07/31/2021, 01/11/2022   Moderna Sars-Covid-2 Vaccination 03/15/2019, 04/12/2019, 01/14/2020, 07/21/2020, 11/24/2020   Pneumococcal Conjugate-13 02/16/2015   Tdap 11/26/2013, 04/27/2019   Pertinent  Health Maintenance Due  Topic Date Due   FOOT EXAM  Never done   DEXA SCAN  Never done   OPHTHALMOLOGY EXAM  09/12/2020   HEMOGLOBIN A1C  07/03/2022   INFLUENZA VACCINE  Completed      04/27/2019    7:06 AM 04/27/2019    7:13 AM 07/17/2019    6:23 PM 07/22/2019    5:24 AM 08/13/2019    7:08 PM  Fall Risk  (RETIRED) Patient Fall Risk Level Moderate fall risk High fall risk High fall risk High fall risk High fall risk   Functional Status Survey:    Vitals:   04/25/22 0909  BP: 134/66  Pulse: 86  Resp: 16  Temp: (!) 97.5 F (36.4 C)  SpO2: 96%  Weight: 133 lb 6.4 oz (60.5 kg)  Height: 5' 2"$  (1.575 m)   Body mass index is 24.4 kg/m. Physical Exam  Labs reviewed: Recent Labs    12/31/21 0000 01/02/22 0000  NA 141 139  K 4.1 4.3  CL 100 99  CO2 30* 27*  BUN 9 10  CREATININE 0.8 0.8  CALCIUM 9.6 9.5   Recent Labs    12/31/21 0000 01/02/22 0000  AST 21 23  ALT 12 16  ALKPHOS 89 91   ALBUMIN 4.2 4.0   Recent Labs    12/31/21 0000 01/02/22 0000 01/17/22 0000  WBC 7.5 10.4 10.1  NEUTROABS  --  7,592.00  --   HGB 14.3 14.5 13.0  HCT 43 44 39  PLT 155 165 136*   No results found for: "TSH" Lab Results  Component Value Date   HGBA1C 8.1 01/02/2022   Lab Results  Component Value Date   CHOL 114 12/31/2021   HDL 37 12/31/2021   LDLCALC 52 12/31/2021   TRIG 172 (A) 12/31/2021    Significant Diagnostic Results in last 30 days:  No results found.  Assessment/Plan 1. Primary hypertension -Blood pressure well controlled,  goal bp <140/90 Continue current medications and dietary modifications follow metabolic panel  2. Mild intermittent asthma without complication -controlled, continues singulair at hs  3. Type 2 diabetes mellitus with polyneuropathy (HCC) -Encouraged dietary compliance, routine foot care/monitoring and to keep up with diabetic eye exams through ophthalmology  -will follow up A1c Continue current medication  4. Pure hypertriglyceridemia -LDL at goal. Continue statins   5. Mild vascular dementia with anxiety (HCC) -Stable, no acute changes in cognitive or functional status, continue supportive care.   6. Anxiety To start zoloft 25 mg daily for 2 weeks then increase to 50 mg daily.   Carlos American. [place], [facility] [phone]

## 2022-04-29 LAB — COMPREHENSIVE METABOLIC PANEL
Calcium: 8.9 (ref 8.7–10.7)
eGFR: 69

## 2022-04-29 LAB — BASIC METABOLIC PANEL
BUN: 8 (ref 4–21)
CO2: 29 — AB (ref 13–22)
Chloride: 102 (ref 99–108)
Creatinine: 0.8 (ref 0.5–1.1)
Glucose: 96
Potassium: 4 mEq/L (ref 3.5–5.1)
Sodium: 140 (ref 137–147)

## 2022-04-29 LAB — CBC: RBC: 4.81 (ref 3.87–5.11)

## 2022-04-29 LAB — CBC AND DIFFERENTIAL
HCT: 41 (ref 36–46)
Hemoglobin: 13.4 (ref 12.0–16.0)
Platelets: 241 10*3/uL (ref 150–400)
WBC: 8.7

## 2022-04-29 LAB — HEMOGLOBIN A1C: Hemoglobin A1C: 8

## 2022-05-13 LAB — HM DIABETES EYE EXAM

## 2022-05-20 ENCOUNTER — Encounter: Payer: Self-pay | Admitting: Student

## 2022-06-10 ENCOUNTER — Non-Acute Institutional Stay (SKILLED_NURSING_FACILITY): Payer: Medicare Other | Admitting: Student

## 2022-06-10 ENCOUNTER — Encounter: Payer: Self-pay | Admitting: Student

## 2022-06-10 DIAGNOSIS — E1142 Type 2 diabetes mellitus with diabetic polyneuropathy: Secondary | ICD-10-CM | POA: Diagnosis not present

## 2022-06-10 DIAGNOSIS — M810 Age-related osteoporosis without current pathological fracture: Secondary | ICD-10-CM

## 2022-06-10 DIAGNOSIS — S065XAA Traumatic subdural hemorrhage with loss of consciousness status unknown, initial encounter: Secondary | ICD-10-CM

## 2022-06-10 DIAGNOSIS — F01A4 Vascular dementia, mild, with anxiety: Secondary | ICD-10-CM

## 2022-06-10 DIAGNOSIS — H353230 Exudative age-related macular degeneration, bilateral, stage unspecified: Secondary | ICD-10-CM | POA: Diagnosis not present

## 2022-06-10 DIAGNOSIS — I700 Atherosclerosis of aorta: Secondary | ICD-10-CM | POA: Diagnosis not present

## 2022-06-10 NOTE — Progress Notes (Signed)
Location:  Other [place].  Nursing Home Room Number: [facility] 409A Place of Service:  SNF [phone]) Provider:  Earnestine Mealing, MD  Patient Care Team: Earnestine Mealing, MD as PCP - General [facility] Medicine)  Extended Emergency Contact Information Primary Emergency Contact: Pelc,Geoff Address: [address]          [place], [place] [address] [place] Home Phone: [phone] Mobile Phone: [phone] Relation: Son Secondary Emergency Contact: Rolanda Jay Address: [address]          [place], [place] [address] Home Phone: [phone] Mobile Phone: [phone] Relation: Son  Code Status:  Full Code.  Goals of care: Advanced Directive information    06/10/2022   10:30 AM  Advanced Directives  Does Patient Have a Medical Advance Directive? No  Does patient want to make changes to medical advance directive? No - Patient declined     Chief Complaint  Patient presents with   Medical Management of Chronic Issues    Medical Management of Chronic Issues.     HPI:  Pt is a 89 y.o. female seen today for medical management of chronic diseases.    Patient states she is doing well and has no concerns today. Patient had a fall yesterday. Denies any pain at this time. She remembers falling. She was trying to use the restroom independently. Discussed the importance of call-bell adherence for safety.   Discussed with nurses no injury with fall. Neuro checks initiated.   Past Medical History:  Diagnosis Date   Asthma    as a child   Bell's palsy    Diverticulosis    Environmental and seasonal allergies    Hyperlipidemia    Hypertension    IBS (irritable bowel syndrome)    Lower extremity edema    Macular degeneration, wet    Migraines    Osteoporosis    TIA (transient ischemic attack)    Type 2 diabetes mellitus with polyneuropathy    Past Surgical History:  Procedure Laterality Date   CATARACT EXTRACTION W/PHACO Left 05/29/2016   Procedure:  CATARACT EXTRACTION PHACO AND INTRAOCULAR LENS PLACEMENT (IOC);  Surgeon: Sallee Lange, MD;  Location: [facility] ORS;  Service: Ophthalmology;  Laterality: Left;  [place]: 01:48.8 AP% 25.2 CDE: 50.41 VOZ#[id number]   CHOLECYSTECTOMY     COLONOSCOPY     OOPHORECTOMY     TONSILLECTOMY      Allergies  Allergen Reactions   Erythromycin Diarrhea    Outpatient Encounter Medications as of 06/10/2022  Medication Sig   acetaminophen (TYLENOL) 325 MG tablet Take 975 mg by mouth every 6 (six) hours as needed.   albuterol (VENTOLIN HFA) 108 (90 Base) MCG/ACT inhaler Inhale 2 puffs into the lungs every 4 (four) hours as needed for wheezing or shortness of breath.   alum & mag hydroxide-simeth (MAALOX/MYLANTA) 200-200-20 MG/5ML suspension Take 30 mLs by mouth every 4 (four) hours as needed for indigestion or heartburn.   Artificial Tears ophthalmic solution Place 1 drop into both eyes every 12 (twelve) hours as needed.   benzocaine (HURRICAINE) 20 % GEL Use as directed 1 Application in the mouth or throat every 2 (two) hours as needed.   cetirizine (ZYRTEC) 10 MG tablet Take 10 mg by mouth daily.   guaifenesin (ROBITUSSIN) 100 MG/5ML syrup Take 10 mLs by mouth every 4 (four) hours as needed for cough.   hydrocortisone (ANUSOL-HC) 2.5 % rectal cream Place 1 Application rectally 2 (two) times daily.   Insulin Glargine (BASAGLAR KWIKPEN) 100 UNIT/ML  Inject 45 Units into the skin at bedtime.   metFORMIN (GLUCOPHAGE-XR) 500 MG 24 hr tablet Take 1,000 mg by mouth daily with breakfast. at   montelukast (SINGULAIR) 10 MG tablet Take 10 mg by mouth at bedtime.   ondansetron (ZOFRAN) 4 MG tablet Take 4 mg by mouth every 8 (eight) hours as needed for nausea or vomiting.   polyethylene glycol (MIRALAX / GLYCOLAX) packet Take 17 g by mouth daily.   rosuvastatin (CRESTOR) 10 MG tablet Take 10 mg by mouth daily.   sertraline (ZOLOFT) 50 MG tablet Take 50 mg by mouth daily.   Skin Protectants, Misc. (EUCERIN) cream Apply 1  Application topically daily.   Vitamin D, Ergocalciferol, (DRISDOL) 1.25 MG (50000 UNIT) CAPS capsule Take 50,000 Units by mouth every 7 (seven) days. Wednesday   No facility-administered encounter medications on file as of 06/10/2022.    Review of Systems  Immunization History  Administered Date(s) Administered   Influenza Inj Mdck Quad Pf 12/15/2017   Influenza, High Dose Seasonal PF 12/18/2021   Influenza-Unspecified 12/03/2018, 12/18/2020   MODERNA COVID-19 SARS-COV-2 PEDS BIVALENT BOOSTER 6Y-11Y 07/31/2021   Moderna Covid-19 Vaccine Bivalent Booster 105yrs & up 07/31/2021, 01/11/2022   Moderna Sars-Covid-2 Vaccination 03/15/2019, 04/12/2019, 01/14/2020, 07/21/2020, 11/24/2020   Pneumococcal Conjugate-13 02/16/2015   Tdap 11/26/2013, 04/27/2019   Pertinent  Health Maintenance Due  Topic Date Due   DEXA SCAN  Never done   INFLUENZA VACCINE  10/03/2022   HEMOGLOBIN A1C  10/28/2022   FOOT EXAM  01/23/2023   OPHTHALMOLOGY EXAM  05/13/2023      04/27/2019    7:06 AM 04/27/2019    7:13 AM 07/17/2019    6:23 PM 07/22/2019    5:24 AM 08/13/2019    7:08 PM  Fall Risk  (RETIRED) Patient Fall Risk Level Moderate fall risk High fall risk High fall risk High fall risk High fall risk   Functional Status Survey:    Vitals:   06/10/22 1025  BP: (!) 118/57  Pulse: 88  Resp: 18  Temp: 98.1 F (36.7 C)  SpO2: 95%  Weight: 133 lb 3.2 oz (60.4 kg)  Height: 5\' 2"  (1.575 m)   Body mass index is 24.36 kg/m. Physical Exam Vitals reviewed.  Cardiovascular:     Rate and Rhythm: Normal rate.     Pulses: Normal pulses.  Pulmonary:     Effort: Pulmonary effort is normal.  Neurological:     Mental Status: She is alert. Mental status is at baseline.     Labs reviewed: Recent Labs    12/31/21 0000 01/02/22 0000 04/29/22 0000  NA 141 139 140  K 4.1 4.3 4.0  CL 100 99 102  CO2 30* 27* 29*  BUN 9 10 8   CREATININE 0.8 0.8 0.8  CALCIUM 9.6 9.5 8.9   Recent Labs    12/31/21 0000  01/02/22 0000  AST 21 23  ALT 12 16  ALKPHOS 89 91  ALBUMIN 4.2 4.0   Recent Labs    01/02/22 0000 01/17/22 0000 04/29/22 0000  WBC 10.4 10.1 8.7  NEUTROABS 7,592.00  --   --   HGB 14.5 13.0 13.4  HCT 44 39 41  PLT 165 136* 241   No results found for: "TSH" Lab Results  Component Value Date   HGBA1C 8.0 04/29/2022   Lab Results  Component Value Date   CHOL 114 12/31/2021   HDL 37 12/31/2021   LDLCALC 52 12/31/2021   TRIG 172 (A) 12/31/2021  Significant Diagnostic Results in last 30 days:  No results found.  Assessment/Plan 1. Bilateral exudative age-related macular degeneration, unspecified stage (HCC) Continued decline in vision which could be contributing to   2. Atherosclerosis of aorta (HCC) Continue crestor 10 mg daily  3. SDH (subdural hematoma) (HCC) DOI 07/23/2019. Likely contributes to memory decline  4. Type 2 diabetes mellitus with polyneuropathy (HCC) Last A1c within goal range. Continue metformin as previously prescribed.  Hemoglobin A1C  Date/Time Value Ref Range Status  04/29/2022 12:00 AM 8.0  Final    5. Age-related osteoporosis without current pathological fracture Vitamin D levels. Continue vitamin d, consider deescalating to 2000 iu daily instead of higher concentrations.   6. Mild vascular dementia w/ anxiety Patient continuing to decline evident by inability to adequately and accurately seek necessary assistance for self care. Encourage use of call bell. Staffing continues to work on ways to prevent falls and or injuries. Continue zoloft 50 mg daily. Weight stable.   Family/ staff Communication: nursing  Labs/tests ordered:  q31mo CBC, BMP

## 2022-06-13 LAB — CBC AND DIFFERENTIAL
HCT: 39 (ref 36–46)
Hemoglobin: 12.4 (ref 12.0–16.0)
Platelets: 260 10*3/uL (ref 150–400)
WBC: 8.9

## 2022-06-13 LAB — COMPREHENSIVE METABOLIC PANEL
Calcium: 9.2 (ref 8.7–10.7)
eGFR: 61

## 2022-06-13 LAB — CBC: RBC: 4.55 (ref 3.87–5.11)

## 2022-06-13 LAB — BASIC METABOLIC PANEL
BUN: 9 (ref 4–21)
CO2: 30 — AB (ref 13–22)
Chloride: 103 (ref 99–108)
Creatinine: 0.9 (ref 0.5–1.1)
Glucose: 91
Potassium: 4.1 mEq/L (ref 3.5–5.1)
Sodium: 140 (ref 137–147)

## 2022-07-03 LAB — LIPID PANEL
Cholesterol: 103 (ref 0–200)
HDL: 35 (ref 35–70)
LDL Cholesterol: 45
Triglycerides: 157 (ref 40–160)

## 2022-07-03 LAB — HEMOGLOBIN A1C: Hemoglobin A1C: 7.9

## 2022-07-25 LAB — BASIC METABOLIC PANEL
BUN: 11 (ref 4–21)
CO2: 26 — AB (ref 13–22)
Chloride: 103 (ref 99–108)
Creatinine: 0.8 (ref 0.5–1.1)
Glucose: 110
Potassium: 4.2 mEq/L (ref 3.5–5.1)
Sodium: 139 (ref 137–147)

## 2022-07-25 LAB — CBC: RBC: 4.38 (ref 3.87–5.11)

## 2022-07-25 LAB — CBC AND DIFFERENTIAL
HCT: 36 (ref 36–46)
Hemoglobin: 11.7 — AB (ref 12.0–16.0)
Neutrophils Absolute: 5810
Platelets: 204 10*3/uL (ref 150–400)
WBC: 8.3

## 2022-07-25 LAB — COMPREHENSIVE METABOLIC PANEL
Calcium: 9 (ref 8.7–10.7)
eGFR: 68

## 2022-08-06 ENCOUNTER — Non-Acute Institutional Stay (SKILLED_NURSING_FACILITY): Payer: Medicare Other | Admitting: Nurse Practitioner

## 2022-08-06 ENCOUNTER — Encounter: Payer: Self-pay | Admitting: Nurse Practitioner

## 2022-08-06 DIAGNOSIS — E1142 Type 2 diabetes mellitus with diabetic polyneuropathy: Secondary | ICD-10-CM | POA: Diagnosis not present

## 2022-08-06 DIAGNOSIS — E781 Pure hyperglyceridemia: Secondary | ICD-10-CM

## 2022-08-06 DIAGNOSIS — J4520 Mild intermittent asthma, uncomplicated: Secondary | ICD-10-CM | POA: Diagnosis not present

## 2022-08-06 DIAGNOSIS — I10 Essential (primary) hypertension: Secondary | ICD-10-CM

## 2022-08-06 DIAGNOSIS — F01A4 Vascular dementia, mild, with anxiety: Secondary | ICD-10-CM

## 2022-08-06 DIAGNOSIS — E559 Vitamin D deficiency, unspecified: Secondary | ICD-10-CM

## 2022-08-06 DIAGNOSIS — F419 Anxiety disorder, unspecified: Secondary | ICD-10-CM

## 2022-08-06 NOTE — Progress Notes (Signed)
Location:  Other Nursing Home Room Number: [facility] 409A Place of Service:  SNF [phone])  Sydnee Cabal, [place], MD  Patient Care Team: Earnestine Mealing, MD as PCP - General [facility] Medicine)  Extended Emergency Contact Information Primary Emergency Contact: Stofko,Geoff Address: [address]          [place], [place] [address] [place] of [place] Home Phone: [phone] Mobile Phone: [phone] Relation: Son Secondary Emergency Contact: Rolanda Jay Address: [address]          [place], [place] [address] Home Phone: [phone] Mobile Phone: [phone] Relation: Son  Goals of care: Advanced Directive information    08/06/2022    4:12 PM  Advanced Directives  Does Patient Have a Medical Advance Directive? No  Does patient want to make changes to medical advance directive? No - Patient declined     Chief Complaint  Patient presents with   Medical Management of Chronic Issues    Medical Management of Chronic Issues.     HPI:  Pt is a 89 y.o. female seen today for medical management of chronic disease. Pt with DM, hyperlipidemia, htn.  Nursing reports she eats a lot of candy and junk food. Pt admits to liking sweets.  A1c 7.9 on last labs.  No abnormal pains Reports bowels moving well. More on constipation side vs diarrhea.  She has no complaints or concerns today.    Past Medical History:  Diagnosis Date   Asthma    as a child   Bell's palsy    Diverticulosis    Environmental and seasonal allergies    Hyperlipidemia    Hypertension    IBS (irritable bowel syndrome)    Lower extremity edema    Macular degeneration, wet (HCC)    Migraines    Osteoporosis    TIA (transient ischemic attack)    Type 2 diabetes mellitus with polyneuropathy [facility])    Past Surgical History:  Procedure Laterality Date   CATARACT EXTRACTION W/PHACO Left 05/29/2016   Procedure: CATARACT EXTRACTION PHACO AND INTRAOCULAR LENS PLACEMENT (IOC);  Surgeon: Sallee Lange,  MD;  Location: [facility] ORS;  Service: Ophthalmology;  Laterality: Left;  [place]: 01:48.8 AP% 25.2 CDE: 50.41 VOZ#[id number] H   CHOLECYSTECTOMY     COLONOSCOPY     OOPHORECTOMY     TONSILLECTOMY      Allergies  Allergen Reactions   Erythromycin Diarrhea    Outpatient Encounter Medications as of 08/06/2022  Medication Sig   acetaminophen (TYLENOL) 325 MG tablet Take 975 mg by mouth every 6 (six) hours as needed.   albuterol (VENTOLIN HFA) 108 (90 Base) MCG/ACT inhaler Inhale 2 puffs into the lungs every 4 (four) hours as needed for wheezing or shortness of breath.   alum & mag hydroxide-simeth (MAALOX/MYLANTA) 200-200-20 MG/5ML suspension Take 30 mLs by mouth every 4 (four) hours as needed for indigestion or heartburn.   Artificial Tears ophthalmic solution Place 1 drop into both eyes every 12 (twelve) hours as needed.   benzocaine (HURRICAINE) 20 % GEL Use as directed 1 Application in the mouth or throat every 2 (two) hours as needed.   cetirizine (ZYRTEC) 10 MG tablet Take 10 mg by mouth daily.   guaifenesin (ROBITUSSIN) 100 MG/5ML syrup Take 10 mLs by mouth every 4 (four) hours as needed for cough.   hydrocortisone (ANUSOL-HC) 2.5 % rectal cream Place 1 Application rectally 2 (two) times daily.   Insulin Glargine (BASAGLAR KWIKPEN) 100 UNIT/ML Inject 45 Units into the skin at bedtime.   metFORMIN (GLUCOPHAGE-XR)  500 MG 24 hr tablet Take 1,000 mg by mouth daily with breakfast. at   montelukast (SINGULAIR) 10 MG tablet Take 10 mg by mouth at bedtime.   ondansetron (ZOFRAN) 4 MG tablet Take 4 mg by mouth every 8 (eight) hours as needed for nausea or vomiting.   polyethylene glycol (MIRALAX / GLYCOLAX) packet Take 17 g by mouth daily.   rosuvastatin (CRESTOR) 10 MG tablet Take 10 mg by mouth daily.   sertraline (ZOLOFT) 50 MG tablet Take 50 mg by mouth daily.   Skin Protectants, Misc. (EUCERIN) cream Apply 1 Application topically daily.   Vitamin D, Ergocalciferol, (DRISDOL) 1.25 MG (50000 UNIT)  CAPS capsule Take 50,000 Units by mouth every 7 (seven) days. Wednesday   No facility-administered encounter medications on file as of 08/06/2022.    Review of Systems  Constitutional:  Negative for activity change, appetite change, fatigue and unexpected weight change.  HENT:  Negative for congestion and hearing loss.   Eyes: Negative.   Respiratory:  Negative for cough and shortness of breath.   Cardiovascular:  Negative for chest pain, palpitations and leg swelling.  Gastrointestinal:  Negative for abdominal pain, constipation and diarrhea.  Genitourinary:  Negative for difficulty urinating and dysuria.  Musculoskeletal:  Negative for arthralgias and myalgias.  Skin:  Negative for color change and wound.  Neurological:  Negative for dizziness and weakness.  Psychiatric/Behavioral:  Positive for confusion. Negative for agitation and behavioral problems.      Immunization History  Administered Date(s) Administered   Influenza Inj Mdck Quad Pf 12/15/2017   Influenza, High Dose Seasonal PF 12/18/2021   Influenza-Unspecified 12/03/2018, 12/18/2020   MODERNA COVID-19 SARS-COV-2 PEDS BIVALENT BOOSTER 6Y-11Y 07/31/2021   Moderna Covid-19 Vaccine Bivalent Booster 75yrs & up 07/31/2021, 01/11/2022   Moderna Sars-Covid-2 Vaccination 03/15/2019, 04/12/2019, 01/14/2020, 07/21/2020, 11/24/2020   Pneumococcal Conjugate-13 02/16/2015   Tdap 11/26/2013, 04/27/2019   Pertinent  Health Maintenance Due  Topic Date Due   DEXA SCAN  Never done   INFLUENZA VACCINE  10/03/2022   HEMOGLOBIN A1C  01/03/2023   FOOT EXAM  01/23/2023   OPHTHALMOLOGY EXAM  05/13/2023      04/27/2019    7:06 AM 04/27/2019    7:13 AM 07/17/2019    6:23 PM 07/22/2019    5:24 AM 08/13/2019    7:08 PM  Fall Risk  (RETIRED) Patient Fall Risk Level Moderate fall risk High fall risk High fall risk High fall risk High fall risk   Functional Status Survey:    Vitals:   08/06/22 1605 08/06/22 1618  BP: (!) 155/75 (!) 140/72   Pulse: 85   Resp: 15   Temp: 97.8 F (36.6 C)   SpO2: 92%   Weight: 130 lb 9.6 oz (59.2 kg)   Height: 5\' 2"  (1.575 m)    Body mass index is 23.89 kg/m. Physical Exam Constitutional:      General: She is not in acute distress.    Appearance: She is well-developed. She is not diaphoretic.  HENT:     Head: Normocephalic and atraumatic.     Mouth/Throat:     Pharynx: No oropharyngeal exudate.  Eyes:     Conjunctiva/sclera: Conjunctivae normal.     Pupils: Pupils are equal, round, and reactive to light.  Cardiovascular:     Rate and Rhythm: Normal rate and regular rhythm.     Heart sounds: Normal heart sounds.  Pulmonary:     Effort: Pulmonary effort is normal.     Breath sounds: Normal  breath sounds.  Abdominal:     General: Bowel sounds are normal.     Palpations: Abdomen is soft.  Musculoskeletal:     Cervical back: Normal range of motion and neck supple.     Right lower leg: No edema.     Left lower leg: No edema.  Skin:    General: Skin is warm and dry.  Neurological:     Mental Status: She is alert. Mental status is at baseline.  Psychiatric:        Mood and Affect: Mood normal.     Labs reviewed: Recent Labs    04/29/22 0000 06/13/22 0000 07/25/22 0000  NA 140 140 139  K 4.0 4.1 4.2  CL 102 103 103  CO2 29* 30* 26*  BUN 8 9 11   CREATININE 0.8 0.9 0.8  CALCIUM 8.9 9.2 9.0   Recent Labs    12/31/21 0000 01/02/22 0000  AST 21 23  ALT 12 16  ALKPHOS 89 91  ALBUMIN 4.2 4.0   Recent Labs    01/02/22 0000 01/17/22 0000 04/29/22 0000 06/13/22 0000 07/25/22 0000  WBC 10.4   < > 8.7 8.9 8.3  NEUTROABS 7,592.00  --   --   --  5,810.00  HGB 14.5   < > 13.4 12.4 11.7*  HCT 44   < > 41 39 36  PLT 165   < > 241 260 204   < > = values in this interval not displayed.   No results found for: "TSH" Lab Results  Component Value Date   HGBA1C 7.9 07/03/2022   Lab Results  Component Value Date   CHOL 103 07/03/2022   HDL 35 07/03/2022   LDLCALC  45 07/03/2022   TRIG 157 07/03/2022    Significant Diagnostic Results in last 30 days:  No results found.  Assessment/Plan 1. Mild vascular dementia with anxiety (HCC) -Stable, no acute changes in cognitive or functional status, continue supportive care.   2. Type 2 diabetes mellitus with polyneuropathy (HCC) -not at goal, will increase metformin to 1000 mg PO BID with meals.  Encouraged dietary compliance.   3. Primary hypertension -Blood pressure well controlled, goal bp <140/90 Continue current medications and dietary modifications follow metabolic panel  4. Mild intermittent asthma without complication -stable, continue on Singulair   5. Pure hypertriglyceridemia -LDL at goal on crestor   6. Anxiety Stable on zoloft 50 mg by mouth daily   7. Vitamin D deficiency -continues on supplement.    Janene Harvey. Biagio Borg [facility] [phone]

## 2022-08-27 LAB — HM DIABETES FOOT EXAM

## 2022-08-30 ENCOUNTER — Encounter: Payer: Self-pay | Admitting: Student

## 2022-09-20 ENCOUNTER — Encounter: Payer: Self-pay | Admitting: Student

## 2022-09-20 ENCOUNTER — Non-Acute Institutional Stay (SKILLED_NURSING_FACILITY): Payer: Medicare Other | Admitting: Student

## 2022-09-20 DIAGNOSIS — E1142 Type 2 diabetes mellitus with diabetic polyneuropathy: Secondary | ICD-10-CM

## 2022-09-20 DIAGNOSIS — F01A4 Vascular dementia, mild, with anxiety: Secondary | ICD-10-CM

## 2022-09-20 DIAGNOSIS — I700 Atherosclerosis of aorta: Secondary | ICD-10-CM

## 2022-09-20 DIAGNOSIS — Z794 Long term (current) use of insulin: Secondary | ICD-10-CM

## 2022-09-20 DIAGNOSIS — S065X9A Traumatic subdural hemorrhage with loss of consciousness of unspecified duration, initial encounter: Secondary | ICD-10-CM | POA: Diagnosis not present

## 2022-09-20 DIAGNOSIS — H353230 Exudative age-related macular degeneration, bilateral, stage unspecified: Secondary | ICD-10-CM

## 2022-09-20 DIAGNOSIS — C189 Malignant neoplasm of colon, unspecified: Secondary | ICD-10-CM

## 2022-09-20 NOTE — Progress Notes (Unsigned)
Location:  Other [facility] [place].  Nursing Home Room Number: [facility] 409A Place of Service:  SNF [phone]) Provider:  Earnestine Mealing, MD  Patient Care Team: Earnestine Mealing, MD as PCP - General [facility])  Extended Emergency Contact Information Primary Emergency Contact: Keahey,Geoff Address: [address]          [place], [place] [address] Darden Amber of [place] Home Phone: [phone] Mobile Phone: [phone] Relation: Son Secondary Emergency Contact: Rolanda Jay Address: [address]          [place], [place] [address] Home Phone: [phone] Mobile Phone: [phone] Relation: Son  Code Status:  Full Code Goals of care: Advanced Directive information    08/06/2022    4:12 PM  Advanced Directives  Does Patient Have a Medical Advance Directive? No  Does patient want to make changes to medical advance directive? No - Patient declined     Chief Complaint  Patient presents with   Medical Management of Chronic Issues    Medical Management of Chronic Issues.     HPI:  Pt is a 89 y.o. female seen today for medical management of chronic diseases.   Patient is alert and oriented to self, place, situation. She states we are at [facility]. She gives her son- David's name and states if we have a conversation about her goals of care in the future she would like him present. 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 She staes, I'm so tickled that our clothing matches today.   She states she would like to remain full code and states, I'm guessing you are concerned about this decision. Discussed concern that given her age and current functional status her likelihood of recovery is extremely low. She would like to discuss further with her son present.   Nursing without concerns at this time.   Past Medical History:  Diagnosis Date   Asthma    as a child   Bell's palsy    Diverticulosis    Environmental and seasonal allergies    Hyperlipidemia    Hypertension     IBS (irritable bowel syndrome)    Lower extremity edema    Macular degeneration, wet (HCC)    Migraines    Osteoporosis    TIA (transient ischemic attack)    Type 2 diabetes mellitus with polyneuropathy [facility])    Past Surgical History:  Procedure Laterality Date   CATARACT EXTRACTION W/PHACO Left 05/29/2016   Procedure: CATARACT EXTRACTION PHACO AND INTRAOCULAR LENS PLACEMENT (IOC);  Surgeon: Sallee Lange, MD;  Location: [facility] ORS;  Service: Ophthalmology;  Laterality: Left;  [place]: 01:48.8 AP% 25.2 CDE: 50.41 VPX#[id number]   CHOLECYSTECTOMY     COLONOSCOPY     OOPHORECTOMY     TONSILLECTOMY      Allergies  Allergen Reactions   Erythromycin Diarrhea    Outpatient Encounter Medications as of 09/20/2022  Medication Sig   acetaminophen (TYLENOL) 325 MG tablet Take 975 mg by mouth every 6 (six) hours as needed.   albuterol (VENTOLIN HFA) 108 (90 Base) MCG/ACT inhaler Inhale 2 puffs into the lungs every 4 (four) hours as needed for wheezing or shortness of breath.   alum & mag hydroxide-simeth (MAALOX/MYLANTA) 200-200-20 MG/5ML suspension Take 30 mLs by mouth every 4 (four) hours as needed for indigestion or heartburn.   Artificial Tears ophthalmic solution Place 1 drop into both eyes every 12 (twelve) hours as needed.   benzocaine (HURRICAINE) 20 % GEL Use as directed 1 Application in the mouth  or throat every 2 (two) hours as needed.   cetirizine (ZYRTEC) 10 MG tablet Take 10 mg by mouth daily.   guaifenesin (ROBITUSSIN) 100 MG/5ML syrup Take 10 mLs by mouth every 4 (four) hours as needed for cough.   hydrocortisone (ANUSOL-HC) 2.5 % rectal cream Place 1 Application rectally 2 (two) times daily.   Insulin Glargine (BASAGLAR KWIKPEN) 100 UNIT/ML Inject 45 Units into the skin at bedtime.   metFORMIN (GLUCOPHAGE-XR) 500 MG 24 hr tablet Take 1,000 mg by mouth 2 (two) times daily with a meal. at   montelukast (SINGULAIR) 10 MG tablet Take 10 mg by mouth at bedtime.   ondansetron  (ZOFRAN) 4 MG tablet Take 4 mg by mouth every 8 (eight) hours as needed for nausea or vomiting.   polyethylene glycol (MIRALAX / GLYCOLAX) packet Take 17 g by mouth daily.   rosuvastatin (CRESTOR) 10 MG tablet Take 10 mg by mouth daily.   sertraline (ZOLOFT) 50 MG tablet Take 50 mg by mouth daily.   Skin Protectants, Misc. (EUCERIN) cream Apply 1 Application topically daily.   Vitamin D, Ergocalciferol, (DRISDOL) 1.25 MG (50000 UNIT) CAPS capsule Take 50,000 Units by mouth every 7 (seven) days. Wednesday   No facility-administered encounter medications on file as of 09/20/2022.    Review of Systems  Immunization History  Administered Date(s) Administered   Influenza Inj Mdck Quad Pf 12/15/2017   Influenza, High Dose Seasonal PF 12/18/2021   Influenza-Unspecified 12/03/2018, 12/18/2020   MODERNA COVID-19 SARS-COV-2 PEDS BIVALENT BOOSTER 83yr-29yr 07/31/2021   Moderna Covid-19 Vaccine Bivalent Booster 1yrs & up 07/31/2021, 01/11/2022   Moderna Sars-Covid-2 Vaccination 03/15/2019, 04/12/2019, 01/14/2020, 07/21/2020, 11/24/2020   Pneumococcal Conjugate-13 02/16/2015   Tdap 11/26/2013, 04/27/2019   Pertinent  Health Maintenance Due  Topic Date Due   DEXA SCAN  Never done   INFLUENZA VACCINE  10/03/2022   HEMOGLOBIN A1C  01/03/2023   OPHTHALMOLOGY EXAM  05/13/2023   FOOT EXAM  08/27/2023      04/27/2019    7:06 AM 04/27/2019    7:13 AM 07/17/2019    6:23 PM 07/22/2019    5:24 AM 08/13/2019    7:08 PM  Fall Risk  (RETIRED) Patient Fall Risk Level Moderate fall risk High fall risk High fall risk High fall risk High fall risk   Functional Status Survey:    Vitals:   09/20/22 0926  BP: 126/72  Pulse: 79  Resp: 17  Temp: 97.6 F (36.4 C)  SpO2: 95%  Weight: 122 lb 9.6 oz (55.6 kg)  Height: 5\' 2"  (1.575 m)   Body mass index is 22.42 kg/m. Physical Exam Constitutional:      Appearance: Normal appearance.     Comments: Sitting in recliner   Cardiovascular:     Rate and  Rhythm: Normal rate and regular rhythm.     Pulses: Normal pulses.  Pulmonary:     Effort: Pulmonary effort is normal.  Abdominal:     General: Abdomen is flat.     Palpations: Abdomen is soft.  Skin:    General: Skin is warm and dry.  Neurological:     Mental Status: She is alert and oriented to person, place, and time.     Labs reviewed: Recent Labs    04/29/22 0000 06/13/22 0000 07/25/22 0000  NA 140 140 139  K 4.0 4.1 4.2  CL 102 103 103  CO2 29* 30* 26*  BUN 8 9 11   CREATININE 0.8 0.9 0.8  CALCIUM 8.9 9.2 9.0  Recent Labs    12/31/21 0000 01/02/22 0000  AST 21 23  ALT 12 16  ALKPHOS 89 91  ALBUMIN 4.2 4.0   Recent Labs    01/02/22 0000 01/17/22 0000 04/29/22 0000 06/13/22 0000 07/25/22 0000  WBC 10.4   < > 8.7 8.9 8.3  NEUTROABS 7,592.00  --   --   --  5,810.00  HGB 14.5   < > 13.4 12.4 11.7*  HCT 44   < > 41 39 36  PLT 165   < > 241 260 204   < > = values in this interval not displayed.   No results found for: "TSH" Lab Results  Component Value Date   HGBA1C 7.9 07/03/2022   Lab Results  Component Value Date   CHOL 103 07/03/2022   HDL 35 07/03/2022   LDLCALC 45 07/03/2022   TRIG 157 07/03/2022    Significant Diagnostic Results in last 30 days:  No results found.  Assessment/Plan 1. Malignant neoplasm of colon, unspecified part of colon (HCC) Hx of colon cancer, no active signs or symptoms at this time. Continue to monitor.   2. Traumatic subdural hematoma with loss of consciousness, initial encounter (HCC) Hx of traumatic subdural hematoma 07/22/2019. Patient without symptoms at this time or recent falls.   3. Mild vascular dementia with anxiety (HCC) Stable without signs of acute functional decline at this time. Continue to monitor and provide supportive car. Continue sertraline 50 mg daily, consider dose reduction at follow up.   4. Type 2 diabetes mellitus with diabetic polyneuropathy, with long-term current use of insulin  (HCC) DM well-controlled less than 8. Continue with quarterly checks. Continue metformin 2000 mg per day.   5. 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   6. Atherosclerosis of aorta (HCC) Continue crestor.    Family/ staff Communication: nursing  Labs/tests ordered:  none  I spent greater than 30  minutes for the care of this patient in face to face time, chart review, clinical documentation, patient education. I spent an additional 16 minutes discussing goals of care and advanced care planning.

## 2022-09-23 ENCOUNTER — Encounter: Payer: Self-pay | Admitting: Student

## 2022-09-23 DIAGNOSIS — C189 Malignant neoplasm of colon, unspecified: Secondary | ICD-10-CM | POA: Insufficient documentation

## 2022-09-23 HISTORY — DX: Malignant neoplasm of colon, unspecified: C18.9

## 2022-09-24 ENCOUNTER — Encounter: Payer: Self-pay | Admitting: Nurse Practitioner

## 2022-09-24 ENCOUNTER — Non-Acute Institutional Stay (SKILLED_NURSING_FACILITY): Payer: Medicare Other | Admitting: Nurse Practitioner

## 2022-09-24 DIAGNOSIS — B351 Tinea unguium: Secondary | ICD-10-CM

## 2022-09-24 NOTE — Telephone Encounter (Signed)
Message forwarded to Earnestine Mealing, MD

## 2022-09-24 NOTE — Progress Notes (Signed)
Location:  Other [facility].  Nursing Home Room Number: [facility] 409A Place of Service:  SNF [phone]) Abbey Chatters, NP  PCP: Earnestine Mealing, MD  Patient Care Team: Earnestine Mealing, MD as PCP - General [facility])  Extended Emergency Contact Information Primary Emergency Contact: League,Geoff Address: [address]          [place], [place] [address] Darden [place] of [place] Home Phone: [phone] Mobile Phone: [phone] Relation: Son Secondary Emergency Contact: Rolanda Jay Address: [address]          [place], [place] [address] Home Phone: [phone] Mobile Phone: [phone] Relation: Son  Goals of care: Advanced Directive information    09/24/2022   11:44 AM  Advanced Directives  Does Patient Have a Medical Advance Directive? No  Does patient want to make changes to medical advance directive? No - Patient declined     Chief Complaint  Patient presents with   Acute Visit    Toenails    HPI:  Pt is a 89 y.o. female seen today for an acute visit for ToeNails.  Pts family reports toenails did not look right and wanted evaluated.  She has been seen by podiatry and had toenails filed recently. Continues to have thick nails.   Past Medical History:  Diagnosis Date   Asthma    as a child   Bell's palsy    Diverticulosis    Environmental and seasonal allergies    Hyperlipidemia    Hypertension    IBS (irritable bowel syndrome)    Lower extremity edema    Macular degeneration, wet (HCC)    Migraines    Osteoporosis    TIA (transient ischemic attack)    Type 2 diabetes mellitus with polyneuropathy [facility])    Past Surgical History:  Procedure Laterality Date   CATARACT EXTRACTION W/PHACO Left 05/29/2016   Procedure: CATARACT EXTRACTION PHACO AND INTRAOCULAR LENS PLACEMENT (IOC);  Surgeon: Sallee Lange, MD;  Location: [facility] ORS;  Service: Ophthalmology;  Laterality: Left;  [place]: 01:48.8 AP% 25.2 CDE: 50.41 [id number] H    CHOLECYSTECTOMY     COLONOSCOPY     OOPHORECTOMY     TONSILLECTOMY      Allergies  Allergen Reactions   Erythromycin Diarrhea    Outpatient Encounter Medications as of 09/24/2022  Medication Sig   acetaminophen (TYLENOL) 325 MG tablet Take 975 mg by mouth every 6 (six) hours as needed.   albuterol (VENTOLIN HFA) 108 (90 Base) MCG/ACT inhaler Inhale 2 puffs into the lungs every 4 (four) hours as needed for wheezing or shortness of breath.   alum & mag hydroxide-simeth (MAALOX/MYLANTA) 200-200-20 MG/5ML suspension Take 30 mLs by mouth every 4 (four) hours as needed for indigestion or heartburn.   Artificial Tears ophthalmic solution Place 1 drop into both eyes every 12 (twelve) hours as needed.   benzocaine (HURRICAINE) 20 % GEL Use as directed 1 Application in the mouth or throat every 2 (two) hours as needed.   cetirizine (ZYRTEC) 10 MG tablet Take 10 mg by mouth daily.   guaifenesin (ROBITUSSIN) 100 MG/5ML syrup Take 10 mLs by mouth every 4 (four) hours as needed for cough.   hydrocortisone (ANUSOL-HC) 2.5 % rectal cream Place 1 Application rectally 2 (two) times daily.   Insulin Glargine (BASAGLAR KWIKPEN) 100 UNIT/ML Inject 45 Units into the skin at bedtime.   metFORMIN (GLUCOPHAGE-XR) 500 MG 24 hr tablet Take 1,000 mg by mouth 2 (two) times daily with a meal. at   montelukast (SINGULAIR) 10 MG  tablet Take 10 mg by mouth at bedtime.   ondansetron (ZOFRAN) 4 MG tablet Take 4 mg by mouth every 8 (eight) hours as needed for nausea or vomiting.   polyethylene glycol (MIRALAX / GLYCOLAX) packet Take 17 g by mouth daily.   rosuvastatin (CRESTOR) 10 MG tablet Take 10 mg by mouth daily.   sertraline (ZOLOFT) 50 MG tablet Take 50 mg by mouth daily.   Skin Protectants, Misc. (EUCERIN) cream Apply 1 Application topically daily.   Vitamin D, Ergocalciferol, (DRISDOL) 1.25 MG (50000 UNIT) CAPS capsule Take 50,000 Units by mouth every 7 (seven) days. Wednesday   No facility-administered encounter  medications on file as of 09/24/2022.    Review of Systems  Unable to perform ROS: Dementia    Immunization History  Administered Date(s) Administered   Influenza Inj Mdck Quad Pf 12/15/2017   Influenza, High Dose Seasonal PF 12/18/2021   Influenza-Unspecified 12/03/2018, 12/18/2020   MODERNA COVID-19 SARS-COV-2 PEDS BIVALENT BOOSTER 40yr-80yr 07/31/2021   Moderna Covid-19 Vaccine Bivalent Booster 73yrs & up 07/31/2021, 01/11/2022   Moderna Sars-Covid-2 Vaccination 03/15/2019, 04/12/2019, 01/14/2020, 07/21/2020, 11/24/2020   Pneumococcal Conjugate-13 02/16/2015   Tdap 11/26/2013, 04/27/2019   Pertinent  Health Maintenance Due  Topic Date Due   DEXA SCAN  Never done   INFLUENZA VACCINE  10/03/2022   HEMOGLOBIN A1C  01/03/2023   OPHTHALMOLOGY EXAM  05/13/2023   FOOT EXAM  08/27/2023      04/27/2019    7:06 AM 04/27/2019    7:13 AM 07/17/2019    6:23 PM 07/22/2019    5:24 AM 08/13/2019    7:08 PM  Fall Risk  (RETIRED) Patient Fall Risk Level Moderate fall risk High fall risk High fall risk High fall risk High fall risk   Functional Status Survey:    Vitals:   09/24/22 1138  BP: 126/72  Pulse: 79  Resp: 17  Temp: 97.6 F (36.4 C)  SpO2: 95%  Weight: 122 lb 9.6 oz (55.6 kg)  Height: 5\' 2"  (1.575 m)   Body mass index is 22.42 kg/m. Physical Exam Feet:     Right foot:     Toenail Condition: Fungal disease present.    Left foot:     Toenail Condition: Fungal disease present.    Comments: Odor with moisture noted to right foot between 4th and 5th toe.     Labs reviewed: Recent Labs    04/29/22 0000 06/13/22 0000 07/25/22 0000  NA 140 140 139  K 4.0 4.1 4.2  CL 102 103 103  CO2 29* 30* 26*  BUN 8 9 11   CREATININE 0.8 0.9 0.8  CALCIUM 8.9 9.2 9.0   Recent Labs    12/31/21 0000 01/02/22 0000  AST 21 23  ALT 12 16  ALKPHOS 89 91  ALBUMIN 4.2 4.0   Recent Labs    01/02/22 0000 01/17/22 0000 04/29/22 0000 06/13/22 0000 07/25/22 0000  WBC 10.4   <  > 8.7 8.9 8.3  NEUTROABS 7,592.00  --   --   --  5,810.00  HGB 14.5   < > 13.4 12.4 11.7*  HCT 44   < > 41 39 36  PLT 165   < > 241 260 204   < > = values in this interval not displayed.   No results found for: "TSH" Lab Results  Component Value Date   HGBA1C 7.9 07/03/2022   Lab Results  Component Value Date   CHOL 103 07/03/2022   HDL 35 07/03/2022  LDLCALC 45 07/03/2022   TRIG 157 07/03/2022    Significant Diagnostic Results in last 30 days:  No results found.  Assessment/Plan 1. Tinea unguium Educated on fungal disease of toenails. Will have staff apply ketoconazole 2% cream daily to right foot after cleaning and drying thoroughly x4 weeks  Charlaine Utsey K. Biagio Borg [facility] [phone]

## 2022-09-24 NOTE — Telephone Encounter (Signed)
Yes I saw patient, she has thick fungal toenails. I do not think they can be cut down any further without risk of bleeding. S He did have some fungus to her right foot and I prescribed a cream for staff to apply daily

## 2022-09-26 ENCOUNTER — Encounter: Payer: Self-pay | Admitting: Nurse Practitioner

## 2022-09-26 ENCOUNTER — Non-Acute Institutional Stay (INDEPENDENT_AMBULATORY_CARE_PROVIDER_SITE_OTHER): Payer: Medicare Other | Admitting: Nurse Practitioner

## 2022-09-26 DIAGNOSIS — Z0000 Encounter for general adult medical examination without abnormal findings: Secondary | ICD-10-CM

## 2022-09-26 NOTE — Progress Notes (Addendum)
Subjective:   Carrie Pratt is a 89 y.o. female who presents for Medicare Annual (Subsequent) preventive examination.  Visit Complete: In person  Patient Medicare AWV questionnaire was completed by the patient on 7/25; I have confirmed that all information answered by patient is correct and no changes since this date.  Review of Systems           Objective:    Today's Vitals   09/26/22 0947  BP: 126/72  Pulse: 79  Resp: 17  Temp: 97.6 F (36.4 C)  SpO2: 95%  Weight: 122 lb 9.6 oz (55.6 kg)  Height: 5\' 2"  (1.575 m)   Body mass index is 22.42 kg/m.     09/26/2022    9:55 AM 09/24/2022   11:44 AM 08/06/2022    4:12 PM 06/10/2022   10:30 AM 04/25/2022    9:16 AM 03/18/2022    1:52 PM 03/01/2022    8:48 AM  Advanced Directives  Does Patient Have a Medical Advance Directive? No No No No No No No  Does patient want to make changes to medical advance directive? No - Patient declined No - Patient declined No - Patient declined No - Patient declined     Would patient like information on creating a medical advance directive?     No - Patient declined No - Patient declined No - Patient declined    Current Medications (verified) Outpatient Encounter Medications as of 09/26/2022  Medication Sig   acetaminophen (TYLENOL) 325 MG tablet Take 975 mg by mouth every 6 (six) hours as needed.   albuterol (VENTOLIN HFA) 108 (90 Base) MCG/ACT inhaler Inhale 2 puffs into the lungs every 4 (four) hours as needed for wheezing or shortness of breath.   alum & mag hydroxide-simeth (MAALOX/MYLANTA) 200-200-20 MG/5ML suspension Take 30 mLs by mouth every 4 (four) hours as needed for indigestion or heartburn.   Artificial Tears ophthalmic solution Place 1 drop into both eyes every 12 (twelve) hours as needed.   benzocaine (HURRICAINE) 20 % GEL Use as directed 1 Application in the mouth or throat every 2 (two) hours as needed.   cetirizine (ZYRTEC) 10 MG tablet Take 10 mg by mouth daily.   guaifenesin  (ROBITUSSIN) 100 MG/5ML syrup Take 10 mLs by mouth every 4 (four) hours as needed for cough.   hydrocortisone (ANUSOL-HC) 2.5 % rectal cream Place 1 Application rectally 2 (two) times daily.   Insulin Glargine (BASAGLAR KWIKPEN) 100 UNIT/ML Inject 45 Units into the skin at bedtime.   ketoconazole (NIZORAL) 2 % cream Apply 1 Application topically. To right foot between toes every day shift   metFORMIN (GLUCOPHAGE-XR) 500 MG 24 hr tablet Take 1,000 mg by mouth 2 (two) times daily with a meal. at   montelukast (SINGULAIR) 10 MG tablet Take 10 mg by mouth at bedtime.   ondansetron (ZOFRAN) 4 MG tablet Take 4 mg by mouth every 8 (eight) hours as needed for nausea or vomiting.   polyethylene glycol (MIRALAX / GLYCOLAX) packet Take 17 g by mouth daily.   rosuvastatin (CRESTOR) 10 MG tablet Take 10 mg by mouth daily.   sertraline (ZOLOFT) 50 MG tablet Take 50 mg by mouth daily.   Skin Protectants, Misc. (EUCERIN) cream Apply 1 Application topically daily.   Vitamin D, Ergocalciferol, (DRISDOL) 1.25 MG (50000 UNIT) CAPS capsule Take 50,000 Units by mouth every 7 (seven) days. Wednesday   No facility-administered encounter medications on file as of 09/26/2022.    Allergies (verified) Erythromycin  History: Past Medical History:  Diagnosis Date   Asthma    as a child   Bell's palsy    Diverticulosis    Environmental and seasonal allergies    Hyperlipidemia    Hypertension    IBS (irritable bowel syndrome)    Lower extremity edema    Macular degeneration, wet (HCC)    Migraines    Osteoporosis    TIA (transient ischemic attack)    Type 2 diabetes mellitus with polyneuropathy [facility])    Past Surgical History:  Procedure Laterality Date   CATARACT EXTRACTION W/PHACO Left 05/29/2016   Procedure: CATARACT EXTRACTION PHACO AND INTRAOCULAR LENS PLACEMENT (IOC);  Surgeon: Sallee Lange, MD;  Location: [facility] ORS;  Service: Ophthalmology;  Laterality: Left;  [place]: 01:48.8 AP% 25.2 CDE:  50.41 [id number] H   CHOLECYSTECTOMY     COLONOSCOPY     OOPHORECTOMY     TONSILLECTOMY     History reviewed. No pertinent family history. Social History   Socioeconomic History   Marital status: Widowed    Spouse name: Not on file   Number of children: 2   Years of education: Not on file   Highest education level: Not on file  Occupational History   Occupation: Teacher--private school (all levels)    Comment: Retired  Tobacco Use   Smoking status: Former    Current packs/day: 0.00    Types: Cigarettes    Quit date: 03/04/1998    Years since quitting: 24.5   Smokeless tobacco: Never  Vaping Use   Vaping status: Never Used  Substance and Sexual Activity   Alcohol use: Yes    Comment: occasional   Drug use: No   Sexual activity: Not on file  Other Topics Concern   Not on file  Social History Narrative   Widowed 1977   2 sons      Not sure about living will   Sons should be health care POA   Would accept resuscitation   No tube feeds if cognitively unaware   Social Determinants of Health   Financial Resource Strain: Not on file  Food Insecurity: Not on file  Transportation Needs: Not on file  Physical Activity: Not on file  Stress: Not on file  Social Connections: Not on file    Tobacco Counseling Counseling given: Not Answered   Clinical Intake:  Pre-visit preparation completed: Yes  Pain : No/denies pain     BMI - recorded: 22 Nutritional Status: BMI of 19-24  Normal Nutritional Risks: None Diabetes: Yes  How often do you need to have someone help you when you read instructions, pamphlets, or other written materials from your doctor or pharmacy?: 5 - Always         Activities of Daily Living     No data to display          Patient Care Team: Earnestine Mealing, MD as PCP - General (Family Medicine)  Indicate any recent Medical Services you may have received from other than [facility] providers in the past year (date may be  approximate).     Assessment:   This is a routine wellness examination for [facility].  Hearing/Vision screen No results found.  Dietary issues and exercise activities discussed:     Goals Addressed   None    Depression Screen     No data to display          Fall Risk    09/26/2022   10:48 AM  Fall Risk   Falls in  the past year? 0  Number falls in past yr: 0  Injury with Fall? 0    MEDICARE RISK AT HOME:   TIMED UP AND GO:  Was the test performed?  No    Cognitive Function:        Immunizations Immunization History  Administered Date(s) Administered   Influenza Inj Mdck Quad Pf 12/15/2017   Influenza, High Dose Seasonal PF 12/18/2021   Influenza-Unspecified 12/03/2018, 12/18/2020   MODERNA COVID-19 SARS-COV-2 PEDS BIVALENT BOOSTER 29yr-50yr 07/31/2021   Moderna Covid-19 Vaccine Bivalent Booster 74yrs & up 07/31/2021, 01/11/2022   Moderna Sars-Covid-2 Vaccination 03/15/2019, 04/12/2019, 01/14/2020, 07/21/2020, 11/24/2020   Pneumococcal Conjugate-13 02/16/2015   Tdap 11/26/2013, 04/27/2019    TDAP status: Up to date  Flu Vaccine status: Up to date  Pneumococcal vaccine status: Due, Education has been provided regarding the importance of this vaccine. Advised may receive this vaccine at local pharmacy or Health Dept. Aware to provide a copy of the vaccination record if obtained from local pharmacy or Health Dept. Verbalized acceptance and understanding.  Covid-19 vaccine status: Information provided on how to obtain vaccines.   Qualifies for Shingles Vaccine? Yes   Zostavax completed No   Shingrix Completed?: No.    Education has been provided regarding the importance of this vaccine. Patient has been advised to call insurance company to determine out of pocket expense if they have not yet received this vaccine. Advised may also receive vaccine at local pharmacy or Health Dept. Verbalized acceptance and understanding.  Screening Tests Health Maintenance   Topic Date Due   Zoster Vaccines- Shingrix (1 of 2) Never done   Pneumonia Vaccine 76+ Years old (2 of 2 - PPSV23 or PCV20) 02/16/2016   COVID-19 Vaccine (8 - 2023-24 season) 03/08/2022   INFLUENZA VACCINE  10/03/2022   HEMOGLOBIN A1C  01/03/2023   OPHTHALMOLOGY EXAM  05/13/2023   FOOT EXAM  08/27/2023   Medicare Annual Wellness (AWV)  09/26/2023   DTaP/Tdap/Td (3 - Td or Tdap) 04/26/2029   HPV VACCINES  Aged Out   DEXA SCAN  Discontinued    Health Maintenance  Health Maintenance Due  Topic Date Due   Zoster Vaccines- Shingrix (1 of 2) Never done   Pneumonia Vaccine 25+ Years old (2 of 2 - PPSV23 or PCV20) 02/16/2016   COVID-19 Vaccine (8 - 2023-24 season) 03/08/2022    Colorectal cancer screening: No longer required.   Mammogram status: No longer required due to age.   Does not qualify for bone density due to immobility   Lung Cancer Screening: (Low Dose CT Chest recommended if Age 42-80 years, 20 pack-year currently smoking OR have quit w/in 15years.) does not qualify.   Lung Cancer Screening Referral: na  Additional Screening:  Hepatitis C Screening: does not qualify; Completed   Vision Screening: Recommended annual ophthalmology exams for early detection of glaucoma and other disorders of the eye. Is the patient up to date with their annual eye exam?  Yes  Who is the provider or what is the name of the office in which the patient attends annual eye exams? Onsite eye If pt is not established with a provider, would they like to be referred to a provider to establish care? No .   Dental Screening: Recommended annual dental exams for proper oral hygiene  Diabetic Foot Exam: Diabetic Foot Exam: Completed 08/27/2022  Community Resource Referral / Chronic Care Management: CRR required this visit?  No   CCM required this visit?  No  Plan:     I have personally reviewed and noted the following in the patient's chart:   Medical and social history Use of  alcohol, tobacco or illicit drugs  Current medications and supplements including opioid prescriptions. Patient is not currently taking opioid prescriptions. Functional ability and status Nutritional status Physical activity Advanced directives List of other physicians Hospitalizations, surgeries, and ER visits in previous 12 months Vitals Screenings to include cognitive, depression, and falls Referrals and appointments  In addition, I have reviewed and discussed with patient certain preventive protocols, quality metrics, and best practice recommendations. A written personalized care plan for preventive services as well as general preventive health recommendations were provided to patient.     Sharon Seller, NP   09/26/2022

## 2022-10-24 LAB — BASIC METABOLIC PANEL
BUN: 15 (ref 4–21)
CO2: 27 — AB (ref 13–22)
Chloride: 104 (ref 99–108)
Creatinine: 0.8 (ref 0.5–1.1)
Glucose: 93
Potassium: 4.5 mEq/L (ref 3.5–5.1)
Sodium: 140 (ref 137–147)

## 2022-10-24 LAB — COMPREHENSIVE METABOLIC PANEL
Calcium: 9.1 (ref 8.7–10.7)
eGFR: 73

## 2022-11-21 ENCOUNTER — Encounter: Payer: Self-pay | Admitting: Nurse Practitioner

## 2022-11-21 ENCOUNTER — Non-Acute Institutional Stay (SKILLED_NURSING_FACILITY): Payer: Medicare Other | Admitting: Nurse Practitioner

## 2022-11-21 DIAGNOSIS — I700 Atherosclerosis of aorta: Secondary | ICD-10-CM | POA: Diagnosis not present

## 2022-11-21 DIAGNOSIS — E1142 Type 2 diabetes mellitus with diabetic polyneuropathy: Secondary | ICD-10-CM | POA: Diagnosis not present

## 2022-11-21 DIAGNOSIS — E781 Pure hyperglyceridemia: Secondary | ICD-10-CM

## 2022-11-21 DIAGNOSIS — E559 Vitamin D deficiency, unspecified: Secondary | ICD-10-CM

## 2022-11-21 DIAGNOSIS — Z794 Long term (current) use of insulin: Secondary | ICD-10-CM

## 2022-11-21 DIAGNOSIS — J4520 Mild intermittent asthma, uncomplicated: Secondary | ICD-10-CM

## 2022-11-21 DIAGNOSIS — F01A4 Vascular dementia, mild, with anxiety: Secondary | ICD-10-CM | POA: Diagnosis not present

## 2022-11-21 DIAGNOSIS — F419 Anxiety disorder, unspecified: Secondary | ICD-10-CM

## 2022-11-21 LAB — HM DIABETES EYE EXAM

## 2022-11-21 NOTE — Progress Notes (Signed)
Location:  Other [place].  Nursing Home Room Number: [facility] 409A Place of Service:  SNF [phone]) Abbey Chatters, NP  PCP: Earnestine Mealing, MD  Patient Care Team: Earnestine Mealing, MD as PCP - General [facility])  Extended Emergency Contact Information Primary Emergency Contact: Sada,Geoff Address: [address]          [place], [place] [address] [place] of [place] Home Phone: [phone] Mobile Phone: [phone] Relation: Son Secondary Emergency Contact: Rolanda Jay Address: [address]          [place], [place] [address] Home Phone: [phone] Mobile Phone: [phone] Relation: Son  Goals of care: Advanced Directive information    11/21/2022    9:46 AM  Advanced Directives  Does Patient Have a Medical Advance Directive? No  Does patient want to make changes to medical advance directive? No - Patient declined     Chief Complaint  Patient presents with   Medical Management of Chronic Issues    Medical Management of Chronic Issues.     HPI:  Pt is a 89 y.o. female seen today for medical management of chronic disease.  Pt with hx of htn, hyperlipidemia, DM, mild dementia.  She states she has been doing well. No acute complaints today.  Staff has no concerns.    Past Medical History:  Diagnosis Date   Asthma    as a child   Bell's palsy    Diverticulosis    Environmental and seasonal allergies    Hyperlipidemia    Hypertension    IBS (irritable bowel syndrome)    Lower extremity edema    Macular degeneration, wet (HCC)    Migraines    Osteoporosis    TIA (transient ischemic attack)    Type 2 diabetes mellitus with polyneuropathy [facility])    Past Surgical History:  Procedure Laterality Date   CATARACT EXTRACTION W/PHACO Left 05/29/2016   Procedure: CATARACT EXTRACTION PHACO AND INTRAOCULAR LENS PLACEMENT (IOC);  Surgeon: Sallee Lange, MD;  Location: [facility] ORS;  Service: Ophthalmology;  Laterality: Left;  [place]: 01:48.8 AP%  25.2 CDE: 50.41 [id number]   CHOLECYSTECTOMY     COLONOSCOPY     OOPHORECTOMY     TONSILLECTOMY      Allergies  Allergen Reactions   Erythromycin Diarrhea    Outpatient Encounter Medications as of 11/21/2022  Medication Sig   acetaminophen (TYLENOL) 325 MG tablet Take 975 mg by mouth every 6 (six) hours as needed.   albuterol (VENTOLIN HFA) 108 (90 Base) MCG/ACT inhaler Inhale 2 puffs into the lungs every 4 (four) hours as needed for wheezing or shortness of breath.   alum & mag hydroxide-simeth (MAALOX/MYLANTA) 200-200-20 MG/5ML suspension Take 30 mLs by mouth every 4 (four) hours as needed for indigestion or heartburn.   Artificial Tears ophthalmic solution Place 1 drop into both eyes every 12 (twelve) hours as needed.   benzocaine (HURRICAINE) 20 % GEL Use as directed 1 Application in the mouth or throat every 2 (two) hours as needed.   cetirizine (ZYRTEC) 10 MG tablet Take 10 mg by mouth daily.   guaifenesin (ROBITUSSIN) 100 MG/5ML syrup Take 10 mLs by mouth every 4 (four) hours as needed for cough.   hydrocortisone (ANUSOL-HC) 2.5 % rectal cream Place 1 Application rectally 2 (two) times daily.   Insulin Glargine (BASAGLAR KWIKPEN) 100 UNIT/ML Inject 45 Units into the skin at bedtime.   metFORMIN (GLUCOPHAGE-XR) 500 MG 24 hr tablet Take 1,000 mg by mouth 2 (two) times daily with a  meal. at   montelukast (SINGULAIR) 10 MG tablet Take 10 mg by mouth at bedtime.   ondansetron (ZOFRAN) 4 MG tablet Take 4 mg by mouth every 8 (eight) hours as needed for nausea or vomiting.   polyethylene glycol (MIRALAX / GLYCOLAX) packet Take 17 g by mouth daily.   rosuvastatin (CRESTOR) 10 MG tablet Take 10 mg by mouth daily.   sertraline (ZOLOFT) 50 MG tablet Take 50 mg by mouth daily.   Skin Protectants, Misc. (EUCERIN) cream Apply 1 Application topically daily.   Vitamin D, Ergocalciferol, (DRISDOL) 1.25 MG (50000 UNIT) CAPS capsule Take 50,000 Units by mouth every 7 (seven) days. Wednesday    [DISCONTINUED] ketoconazole (NIZORAL) 2 % cream Apply 1 Application topically. To right foot between toes every day shift   No facility-administered encounter medications on file as of 11/21/2022.    Review of Systems  Constitutional:  Negative for activity change, appetite change, fatigue and unexpected weight change.  HENT:  Negative for congestion and hearing loss.   Eyes: Negative.   Respiratory:  Negative for cough and shortness of breath.   Cardiovascular:  Negative for chest pain, palpitations and leg swelling.  Gastrointestinal:  Negative for abdominal pain, constipation and diarrhea.  Genitourinary:  Negative for difficulty urinating and dysuria.  Musculoskeletal:  Negative for arthralgias and myalgias.  Skin:  Negative for color change and wound.  Neurological:  Negative for dizziness and weakness.  Psychiatric/Behavioral:  Negative for agitation, behavioral problems and confusion.      Immunization History  Administered Date(s) Administered   Influenza Inj Mdck Quad Pf 12/15/2017   Influenza, High Dose Seasonal PF 12/18/2021   Influenza-Unspecified 12/03/2018, 12/18/2020   MODERNA COVID-19 SARS-COV-2 PEDS BIVALENT BOOSTER 31yr-51yr 07/31/2021   Moderna Covid-19 Vaccine Bivalent Booster 65yrs & up 07/31/2021, 01/11/2022   Moderna Sars-Covid-2 Vaccination 03/15/2019, 04/12/2019, 01/14/2020, 07/21/2020, 11/24/2020   Pneumococcal Conjugate-13 02/16/2015   Tdap 11/26/2013, 04/27/2019   Pertinent  Health Maintenance Due  Topic Date Due   INFLUENZA VACCINE  10/03/2022   HEMOGLOBIN A1C  01/03/2023   OPHTHALMOLOGY EXAM  05/13/2023   FOOT EXAM  08/27/2023   DEXA SCAN  Discontinued      04/27/2019    7:13 AM 07/17/2019    6:23 PM 07/22/2019    5:24 AM 08/13/2019    7:08 PM 09/26/2022   10:48 AM  Fall Risk  Falls in the past year?     0  Was there an injury with Fall?     0  Fall Risk Category Calculator     0  (RETIRED) Patient Fall Risk Level High fall risk High fall risk  High fall risk High fall risk    Functional Status Survey:    Vitals:   11/21/22 0940  BP: 121/71  Pulse: 82  Resp: 16  Temp: (!) 97.4 F (36.3 C)  SpO2: 95%  Weight: 133 lb 12.8 oz (60.7 kg)  Height: 5\' 2"  (1.575 m)   Body mass index is 24.47 kg/m. Physical Exam Constitutional:      General: She is not in acute distress.    Appearance: She is well-developed. She is not diaphoretic.  HENT:     Head: Normocephalic and atraumatic.     Mouth/Throat:     Pharynx: No oropharyngeal exudate.  Eyes:     Conjunctiva/sclera: Conjunctivae normal.     Pupils: Pupils are equal, round, and reactive to light.  Cardiovascular:     Rate and Rhythm: Normal rate and regular rhythm.  Heart sounds: Normal heart sounds.  Pulmonary:     Effort: Pulmonary effort is normal.     Breath sounds: Normal breath sounds.  Abdominal:     General: Bowel sounds are normal.     Palpations: Abdomen is soft.  Musculoskeletal:     Cervical back: Normal range of motion and neck supple.     Right lower leg: No edema.     Left lower leg: No edema.  Skin:    General: Skin is warm and dry.  Neurological:     Mental Status: She is alert.  Psychiatric:        Mood and Affect: Mood normal.      Labs reviewed: Recent Labs    06/13/22 0000 07/25/22 0000 10/24/22 0000  NA 140 139 140  K 4.1 4.2 4.5  CL 103 103 104  CO2 30* 26* 27*  BUN 9 11 15   CREATININE 0.9 0.8 0.8  CALCIUM 9.2 9.0 9.1   Recent Labs    12/31/21 0000 01/02/22 0000  AST 21 23  ALT 12 16  ALKPHOS 89 91  ALBUMIN 4.2 4.0   Recent Labs    01/02/22 0000 01/17/22 0000 04/29/22 0000 06/13/22 0000 07/25/22 0000  WBC 10.4   < > 8.7 8.9 8.3  NEUTROABS 7,592.00  --   --   --  5,810.00  HGB 14.5   < > 13.4 12.4 11.7*  HCT 44   < > 41 39 36  PLT 165   < > 241 260 204   < > = values in this interval not displayed.   No results found for: "TSH" Lab Results  Component Value Date   HGBA1C 7.9 07/03/2022   Lab Results   Component Value Date   CHOL 103 07/03/2022   HDL 35 07/03/2022   LDLCALC 45 07/03/2022   TRIG 157 07/03/2022    Significant Diagnostic Results in last 30 days:  No results found.  Assessment/Plan 1. Type 2 diabetes mellitus with diabetic polyneuropathy, with long-term current use of insulin (HCC) -Encouraged dietary compliance, routine foot care/monitoring and to keep up with diabetic eye exams through ophthalmology  Cntinues on metformin 1000 mg BID with glargine at bedtime.  A1c goal <8 due to age.  Follow up A1c scheduled for next month.   2. Mild vascular dementia with anxiety (HCC) Stable, no acute changes in cognitive or functional status, continue supportive care.   3. Atherosclerosis of aorta (HCC) -continues on statin  4. Vit d def Continues on supplement   5. Mild intermittent asthma without complication No ongoing symptoms. Continues on singulair  6. Pure hypertriglyceridemia Stable on crestor  7. Anxiety -stable on zoloft, will continue current regimen    Bellany Elbaum K. Biagio Borg [facility] [phone]

## 2022-12-06 ENCOUNTER — Non-Acute Institutional Stay (SKILLED_NURSING_FACILITY): Payer: Self-pay | Admitting: Student

## 2022-12-06 DIAGNOSIS — R350 Frequency of micturition: Secondary | ICD-10-CM

## 2022-12-07 NOTE — Progress Notes (Unsigned)
Location:  Other Nursing Home Room Number: [facility] 409A Place of Service:  SNF [phone]) Provider:  Ander Gaster, Benetta Spar, MD  Patient Care Team: Earnestine Mealing, MD as PCP - General [facility] Medicine)  Extended Emergency Contact Information Primary Emergency Contact: Popelka,Geoff Address: [address]          [place], [place] [address] Darden [place] of [place] Home Phone: [phone] Mobile Phone: [phone] Relation: Son Secondary Emergency Contact: Rolanda Jay Address: [address]          [place], [place] [address] Home Phone: [phone] Mobile Phone: [phone] Relation: Son  Code Status: Full Code Goals of care: Advanced Directive information    11/21/2022    9:46 AM  Advanced Directives  Does Patient Have a Medical Advance Directive? No  Does patient want to make changes to medical advance directive? No - Patient declined     Chief Complaint  Patient presents with   Urinary Frequency    HPI:  Pt is a 89 y.o. female seen today for an acute visit for follow-up of urinary frequency.  Per nursing patient had frequency yesterday, however today denies any symptoms.  She denies burning with urination.  She is alert and oriented x 3.  Morning shift nursing without concern at this time.   Past Medical History:  Diagnosis Date   Asthma    as a child   Bell's palsy    Diverticulosis    Environmental and seasonal allergies    Hyperlipidemia    Hypertension    IBS (irritable bowel syndrome)    Lower extremity edema    Macular degeneration, wet (HCC)    Migraines    Osteoporosis    TIA (transient ischemic attack)    Type 2 diabetes mellitus with polyneuropathy [facility])    Past Surgical History:  Procedure Laterality Date   CATARACT EXTRACTION W/PHACO Left 05/29/2016   Procedure: CATARACT EXTRACTION PHACO AND INTRAOCULAR LENS PLACEMENT (IOC);  Surgeon: Sallee Lange, MD;  Location: [facility] ORS;  Service: Ophthalmology;  Laterality: Left;  [place]:  01:48.8 AP% 25.2 CDE: 50.41 UYQ#[id number]   CHOLECYSTECTOMY     COLONOSCOPY     OOPHORECTOMY     TONSILLECTOMY      Allergies  Allergen Reactions   Erythromycin Diarrhea    Outpatient Encounter Medications as of 12/06/2022  Medication Sig   acetaminophen (TYLENOL) 325 MG tablet Take 975 mg by mouth every 6 (six) hours as needed.   albuterol (VENTOLIN HFA) 108 (90 Base) MCG/ACT inhaler Inhale 2 puffs into the lungs every 4 (four) hours as needed for wheezing or shortness of breath.   alum & mag hydroxide-simeth (MAALOX/MYLANTA) 200-200-20 MG/5ML suspension Take 30 mLs by mouth every 4 (four) hours as needed for indigestion or heartburn.   Artificial Tears ophthalmic solution Place 1 drop into both eyes every 12 (twelve) hours as needed.   benzocaine (HURRICAINE) 20 % GEL Use as directed 1 Application in the mouth or throat every 2 (two) hours as needed.   cetirizine (ZYRTEC) 10 MG tablet Take 10 mg by mouth daily.   guaifenesin (ROBITUSSIN) 100 MG/5ML syrup Take 10 mLs by mouth every 4 (four) hours as needed for cough.   hydrocortisone (ANUSOL-HC) 2.5 % rectal cream Place 1 Application rectally 2 (two) times daily.   Insulin Glargine (BASAGLAR KWIKPEN) 100 UNIT/ML Inject 45 Units into the skin at bedtime.   metFORMIN (GLUCOPHAGE-XR) 500 MG 24 hr tablet Take 1,000 mg by mouth 2 (two) times daily with a meal. at  montelukast (SINGULAIR) 10 MG tablet Take 10 mg by mouth at bedtime.   ondansetron (ZOFRAN) 4 MG tablet Take 4 mg by mouth every 8 (eight) hours as needed for nausea or vomiting.   polyethylene glycol (MIRALAX / GLYCOLAX) packet Take 17 g by mouth daily.   rosuvastatin (CRESTOR) 10 MG tablet Take 10 mg by mouth daily.   sertraline (ZOLOFT) 50 MG tablet Take 50 mg by mouth daily.   Skin Protectants, Misc. (EUCERIN) cream Apply 1 Application topically daily.   Vitamin D, Ergocalciferol, (DRISDOL) 1.25 MG (50000 UNIT) CAPS capsule Take 50,000 Units by mouth every 7 (seven) days.  Wednesday   No facility-administered encounter medications on file as of 12/06/2022.    Review of Systems  Immunization History  Administered Date(s) Administered   Influenza Inj Mdck Quad Pf 12/15/2017   Influenza, High Dose Seasonal PF 12/18/2021   Influenza-Unspecified 12/03/2018, 12/18/2020   MODERNA COVID-19 SARS-COV-2 PEDS BIVALENT BOOSTER 37yr-7yr 07/31/2021   Moderna Covid-19 Vaccine Bivalent Booster 72yrs & up 07/31/2021, 01/11/2022   Moderna Sars-Covid-2 Vaccination 03/15/2019, 04/12/2019, 01/14/2020, 07/21/2020, 11/24/2020   Pneumococcal Conjugate-13 02/16/2015   Tdap 11/26/2013, 04/27/2019   Pertinent  Health Maintenance Due  Topic Date Due   INFLUENZA VACCINE  10/03/2022   HEMOGLOBIN A1C  01/03/2023   OPHTHALMOLOGY EXAM  05/13/2023   FOOT EXAM  08/27/2023   DEXA SCAN  Discontinued      04/27/2019    7:13 AM 07/17/2019    6:23 PM 07/22/2019    5:24 AM 08/13/2019    7:08 PM 09/26/2022   10:48 AM  Fall Risk  Falls in the past year?     0  Was there an injury with Fall?     0  Fall Risk Category Calculator     0  (RETIRED) Patient Fall Risk Level High fall risk High fall risk High fall risk High fall risk    Functional Status Survey:    Vitals:   12/06/22 0538  BP: 137/64  Pulse: 83  Resp: 20  Temp: 98 F (36.7 C)  SpO2: 94%  Weight: 134 lb (60.8 kg)   Body mass index is 24.51 kg/m. Physical Exam Constitutional:      Appearance: Normal appearance.  Cardiovascular:     Rate and Rhythm: Normal rate.  Pulmonary:     Effort: Pulmonary effort is normal.     Breath sounds: Normal breath sounds. No stridor (ur).  Abdominal:     General: Abdomen is flat. Bowel sounds are normal.     Palpations: Abdomen is soft.  Neurological:     Mental Status: She is alert and oriented to person, place, and time.     Labs reviewed: Recent Labs    06/13/22 0000 07/25/22 0000 10/24/22 0000  NA 140 139 140  K 4.1 4.2 4.5  CL 103 103 104  CO2 30* 26* 27*  BUN 9  11 15   CREATININE 0.9 0.8 0.8  CALCIUM 9.2 9.0 9.1   Recent Labs    12/31/21 0000 01/02/22 0000  AST 21 23  ALT 12 16  ALKPHOS 89 91  ALBUMIN 4.2 4.0   Recent Labs    01/02/22 0000 01/17/22 0000 04/29/22 0000 06/13/22 0000 07/25/22 0000  WBC 10.4   < > 8.7 8.9 8.3  NEUTROABS 7,592.00  --   --   --  5,810.00  HGB 14.5   < > 13.4 12.4 11.7*  HCT 44   < > 41 39 36  PLT 165   < >  241 260 204   < > = values in this interval not displayed.   No results found for: "TSH" Lab Results  Component Value Date   HGBA1C 7.9 07/03/2022   Lab Results  Component Value Date   CHOL 103 07/03/2022   HDL 35 07/03/2022   LDLCALC 45 07/03/2022   TRIG 157 07/03/2022    Significant Diagnostic Results in last 30 days:  No results found.  Assessment/Plan Urinary frequency Patient with frequency yesterday. No issues at this time. Plan to encourage hydration x3 days and f/u. Determine need for UA next week.   Family/ staff Communication: Nursing  Labs/tests ordered: None

## 2022-12-09 ENCOUNTER — Encounter: Payer: Self-pay | Admitting: Student

## 2023-01-03 ENCOUNTER — Encounter: Payer: Self-pay | Admitting: Student

## 2023-01-03 ENCOUNTER — Non-Acute Institutional Stay (SKILLED_NURSING_FACILITY): Payer: Self-pay | Admitting: Student

## 2023-01-03 DIAGNOSIS — F01A4 Vascular dementia, mild, with anxiety: Secondary | ICD-10-CM | POA: Diagnosis not present

## 2023-01-03 DIAGNOSIS — E1142 Type 2 diabetes mellitus with diabetic polyneuropathy: Secondary | ICD-10-CM | POA: Diagnosis not present

## 2023-01-03 DIAGNOSIS — H353230 Exudative age-related macular degeneration, bilateral, stage unspecified: Secondary | ICD-10-CM | POA: Diagnosis not present

## 2023-01-03 DIAGNOSIS — R296 Repeated falls: Secondary | ICD-10-CM

## 2023-01-03 DIAGNOSIS — M810 Age-related osteoporosis without current pathological fracture: Secondary | ICD-10-CM

## 2023-01-03 DIAGNOSIS — I10 Essential (primary) hypertension: Secondary | ICD-10-CM | POA: Diagnosis not present

## 2023-01-03 DIAGNOSIS — Z794 Long term (current) use of insulin: Secondary | ICD-10-CM

## 2023-01-03 NOTE — Progress Notes (Signed)
Location:  Other [place]  Nursing Home Room Number: [facility] 409A Place of Service:  SNF [phone]) Provider:  Earnestine Mealing, MD  Patient Care Team: Earnestine Mealing, MD as PCP - General [facility] Medicine)  Extended Emergency Contact Information Primary Emergency Contact: Berrie,Geoff Address: [address]          [place], [place] [address] [place] of [place] Home Phone: [phone] Mobile Phone: [phone] Relation: Son Secondary Emergency Contact: Rolanda Jay Address: [address]          [place], [place] [address] Home Phone: [phone] Mobile Phone: [phone] Relation: Son  Code Status:  Full Code Goals of care: Advanced Directive information    01/03/2023   10:21 AM  Advanced Directives  Does Patient Have a Medical Advance Directive? No  Does patient want to make changes to medical advance directive? No - Patient declined     Chief Complaint  Patient presents with   Medical Management of Chronic Issues    Medical Management of Chronic Issues.     HPI:  Pt is a 89 y.o. female seen today for medical management of chronic diseases.  Patient with 2 falls in the last week.  She states she does not know how she fell.  Per nursing patient was trying to get into bed by herself and did not wait for nursing assistance.  She was found on the floor for unwitnessed fall.  Discussed with patient possibility of impact of medication on increased fall risk.  She would like to discontinue at this time to minimize risk factors.   Past Medical History:  Diagnosis Date   Asthma    as a child   Bell's palsy    Diverticulosis    Environmental and seasonal allergies    Hyperlipidemia    Hypertension    IBS (irritable bowel syndrome)    Lower extremity edema    Macular degeneration, wet (HCC)    Migraines    Osteoporosis    TIA (transient ischemic attack)    Type 2 diabetes mellitus with polyneuropathy [facility])    Past Surgical History:  Procedure  Laterality Date   CATARACT EXTRACTION W/PHACO Left 05/29/2016   Procedure: CATARACT EXTRACTION PHACO AND INTRAOCULAR LENS PLACEMENT (IOC);  Surgeon: Sallee Lange, MD;  Location: [facility] ORS;  Service: Ophthalmology;  Laterality: Left;  [place]: 01:48.8 AP% 25.2 CDE: 50.41 JOA#[id number]   CHOLECYSTECTOMY     COLONOSCOPY     OOPHORECTOMY     TONSILLECTOMY      Allergies  Allergen Reactions   Erythromycin Diarrhea    Outpatient Encounter Medications as of 01/03/2023  Medication Sig   acetaminophen (TYLENOL) 325 MG tablet Take 975 mg by mouth every 6 (six) hours as needed.   albuterol (VENTOLIN HFA) 108 (90 Base) MCG/ACT inhaler Inhale 2 puffs into the lungs every 4 (four) hours as needed for wheezing or shortness of breath.   alum & mag hydroxide-simeth (MAALOX/MYLANTA) 200-200-20 MG/5ML suspension Take 30 mLs by mouth every 4 (four) hours as needed for indigestion or heartburn.   Artificial Tears ophthalmic solution Place 1 drop into both eyes every 12 (twelve) hours as needed.   benzocaine (HURRICAINE) 20 % GEL Use as directed 1 Application in the mouth or throat every 2 (two) hours as needed.   cetirizine (ZYRTEC) 10 MG tablet Take 10 mg by mouth daily.   guaifenesin (ROBITUSSIN) 100 MG/5ML syrup Take 10 mLs by mouth every 4 (four) hours as needed for cough.   hydrocortisone (ANUSOL-HC) 2.5 %  rectal cream Place 1 Application rectally 2 (two) times daily.   Insulin Glargine (BASAGLAR KWIKPEN) 100 UNIT/ML Inject 45 Units into the skin at bedtime.   metFORMIN (GLUCOPHAGE-XR) 500 MG 24 hr tablet Take 1,000 mg by mouth 2 (two) times daily with a meal. at   montelukast (SINGULAIR) 10 MG tablet Take 10 mg by mouth at bedtime.   ondansetron (ZOFRAN) 4 MG tablet Take 4 mg by mouth every 8 (eight) hours as needed for nausea or vomiting.   polyethylene glycol (MIRALAX / GLYCOLAX) packet Take 17 g by mouth daily.   rosuvastatin (CRESTOR) 10 MG tablet Take 10 mg by mouth daily.   sertraline (ZOLOFT) 50  MG tablet Take 50 mg by mouth daily.   Skin Protectants, Misc. (EUCERIN) cream Apply 1 Application topically daily.   Vitamin D, Ergocalciferol, (DRISDOL) 1.25 MG (50000 UNIT) CAPS capsule Take 50,000 Units by mouth every 7 (seven) days. Wednesday   No facility-administered encounter medications on file as of 01/03/2023.    Review of Systems  Immunization History  Administered Date(s) Administered   Influenza Inj Mdck Quad Pf 12/15/2017   Influenza, High Dose Seasonal PF 12/18/2021   Influenza-Unspecified 12/03/2018, 12/18/2020, 12/25/2022   MODERNA COVID-19 SARS-COV-2 PEDS BIVALENT BOOSTER 14yr-69yr 07/31/2021   Moderna Covid-19 Vaccine Bivalent Booster 79yrs & up 07/31/2021, 01/11/2022   Moderna Sars-Covid-2 Vaccination 03/15/2019, 04/12/2019, 01/14/2020, 07/21/2020, 11/24/2020   Pneumococcal Conjugate-13 02/16/2015   Tdap 11/26/2013, 04/27/2019   Zoster Recombinant(Shingrix) 09/30/2022   Pertinent  Health Maintenance Due  Topic Date Due   HEMOGLOBIN A1C  01/03/2023   OPHTHALMOLOGY EXAM  05/13/2023   FOOT EXAM  08/27/2023   INFLUENZA VACCINE  Completed   DEXA SCAN  Discontinued      07/17/2019    6:23 PM 07/22/2019    5:24 AM 08/13/2019    7:08 PM 09/26/2022   10:48 AM 01/03/2023   10:32 PM  Fall Risk  Falls in the past year?    0 1  Was there an injury with Fall?    0 0  Fall Risk Category Calculator    0 2  (RETIRED) Patient Fall Risk Level High fall risk High fall risk High fall risk    Patient at Risk for Falls Due to     History of fall(s);Impaired balance/gait;Impaired mobility  Fall risk Follow up     Falls evaluation completed;Falls prevention discussed;Education provided   Functional Status Survey:    Vitals:   01/03/23 1015  BP: 129/61  Pulse: 84  Resp: 16  Temp: 98 F (36.7 C)  SpO2: 91%  Weight: 132 lb 9.6 oz (60.1 kg)  Height: 5\' 2"  (1.575 m)   Body mass index is 24.25 kg/m. Physical Exam Constitutional:      Appearance: Normal appearance.   Cardiovascular:     Rate and Rhythm: Normal rate.     Pulses: Normal pulses.  Pulmonary:     Effort: Pulmonary effort is normal.     Breath sounds: Normal breath sounds.  Abdominal:     General: Abdomen is flat.     Palpations: Abdomen is soft.  Skin:    General: Skin is warm and dry.  Neurological:     Mental Status: She is alert.     Comments: Patient states it is Thursday November 2024     Labs reviewed: Recent Labs    06/13/22 0000 07/25/22 0000 10/24/22 0000  NA 140 139 140  K 4.1 4.2 4.5  CL 103 103 104  CO2  30* 26* 27*  BUN 9 11 15   CREATININE 0.9 0.8 0.8  CALCIUM 9.2 9.0 9.1   No results for input(s): "AST", "ALT", "ALKPHOS", "BILITOT", "PROT", "ALBUMIN" in the last 8760 hours. Recent Labs    04/29/22 0000 06/13/22 0000 07/25/22 0000  WBC 8.7 8.9 8.3  NEUTROABS  --   --  5,810.00  HGB 13.4 12.4 11.7*  HCT 41 39 36  PLT 241 260 204   No results found for: "TSH" Lab Results  Component Value Date   HGBA1C 7.9 07/03/2022   Lab Results  Component Value Date   CHOL 103 07/03/2022   HDL 35 07/03/2022   LDLCALC 45 07/03/2022   TRIG 157 07/03/2022    Significant Diagnostic Results in last 30 days:  No results found.  Assessment/Plan Type 2 diabetes mellitus with diabetic polyneuropathy, with long-term current use of insulin (HCC)  Mild vascular dementia with anxiety (HCC)  Bilateral exudative age-related macular degeneration, unspecified stage (HCC)  Primary hypertension  Age-related osteoporosis without current pathological fracture  Recurrent falls Patient with multiple falls over the last week likely multifactorial in nature given history of dementia, neuropathy, macular degeneration.  Encourage patient to ask for help with transfers.  Plan to decrease dose of SSRI due to potential risk of increased risk of falls.  Decrease dose to 25 mg daily x 2 weeks then discontinue medication.  Patient is alert and oriented x 2.  Conversant.  Fast stage  V 9.  BP well-controlled with current medications no changes made at this time.  History of osteoporosis some right shoulder pain, however no deformities, bruising, weakness.  Will defer x-ray at this time.  Family/ staff Communication: nursing  Labs/tests ordered:  none

## 2023-01-08 LAB — BASIC METABOLIC PANEL
BUN: 13 (ref 4–21)
CO2: 28 — AB (ref 13–22)
Chloride: 103 (ref 99–108)
Creatinine: 0.8 (ref 0.5–1.1)
Glucose: 75
Potassium: 4.2 meq/L (ref 3.5–5.1)
Sodium: 139 (ref 137–147)

## 2023-01-08 LAB — HEPATIC FUNCTION PANEL
ALT: 9 U/L (ref 7–35)
AST: 15 (ref 13–35)
Alkaline Phosphatase: 84 (ref 25–125)

## 2023-01-08 LAB — COMPREHENSIVE METABOLIC PANEL
Albumin: 3.7 (ref 3.5–5.0)
Calcium: 9.2 (ref 8.7–10.7)
Globulin: 2.8
eGFR: 69

## 2023-01-08 LAB — CBC AND DIFFERENTIAL
HCT: 38 (ref 36–46)
Hemoglobin: 12 (ref 12.0–16.0)
Neutrophils Absolute: 8195
Platelets: 144 10*3/uL — AB (ref 150–400)
WBC: 11

## 2023-01-08 LAB — CBC: RBC: 4.54 (ref 3.87–5.11)

## 2023-01-08 LAB — HEMOGLOBIN A1C: Hemoglobin A1C: 6.9

## 2023-01-20 LAB — BASIC METABOLIC PANEL
BUN: 10 (ref 4–21)
CO2: 29 — AB (ref 13–22)
Chloride: 104 (ref 99–108)
Creatinine: 0.8 (ref 0.5–1.1)
Glucose: 92
Potassium: 4.3 meq/L (ref 3.5–5.1)
Sodium: 139 (ref 137–147)

## 2023-01-20 LAB — COMPREHENSIVE METABOLIC PANEL: Calcium: 9 (ref 8.7–10.7)

## 2023-03-11 ENCOUNTER — Non-Acute Institutional Stay (SKILLED_NURSING_FACILITY): Payer: Self-pay | Admitting: Nurse Practitioner

## 2023-03-11 ENCOUNTER — Encounter: Payer: Self-pay | Admitting: Nurse Practitioner

## 2023-03-11 DIAGNOSIS — M810 Age-related osteoporosis without current pathological fracture: Secondary | ICD-10-CM

## 2023-03-11 DIAGNOSIS — F01A4 Vascular dementia, mild, with anxiety: Secondary | ICD-10-CM

## 2023-03-11 DIAGNOSIS — E1142 Type 2 diabetes mellitus with diabetic polyneuropathy: Secondary | ICD-10-CM

## 2023-03-11 DIAGNOSIS — I10 Essential (primary) hypertension: Secondary | ICD-10-CM | POA: Diagnosis not present

## 2023-03-11 DIAGNOSIS — R296 Repeated falls: Secondary | ICD-10-CM

## 2023-03-11 DIAGNOSIS — F419 Anxiety disorder, unspecified: Secondary | ICD-10-CM

## 2023-03-11 DIAGNOSIS — J4520 Mild intermittent asthma, uncomplicated: Secondary | ICD-10-CM

## 2023-03-11 DIAGNOSIS — Z794 Long term (current) use of insulin: Secondary | ICD-10-CM

## 2023-03-11 NOTE — Progress Notes (Signed)
 Location:  Other [facility] [place].  Nursing Home Room Number: [facility] 409A Place of Service:  SNF [phone]) Carrie An, NP  PCP: Abdul Fine, MD  Patient Care Team: Abdul Fine, MD as PCP - General [facility])  Extended Emergency Contact Information Primary Emergency Contact: CarrieGeoff Address: [address]          [name], [place] [address] [place] Home Phone: [phone] Mobile Phone: [phone] Relation: Son Secondary Emergency Contact: Carrie Pratt Address: [address]          [place], [place] [address] Home Phone: [phone] Mobile Phone: [phone] Relation: Son  Goals of care: Advanced Directive information    03/11/2023   11:58 AM  Advanced Directives  Does Patient Have a Medical Advance Directive? No  Would patient like information on creating a medical advance directive? No - Patient declined     Chief Complaint  Patient presents with   Medical Management of Chronic Issues    Medical Management of Chronic Issues.     HPI:  Pt is a 89 y.o. female seen today for medical management of chronic disease.  Pt as been doing well. She has no complaints at this time Denies any increase in anxiety or depression- recently zoloft was titrated off She denies pain Reports she is sleeping well. Staff reports no recent falls and has no concerns    Past Medical History:  Diagnosis Date   Asthma    as a child   Bell's palsy    Diverticulosis    Environmental and seasonal allergies    Hyperlipidemia    Hypertension    IBS (irritable bowel syndrome)    Lower extremity edema    Macular degeneration, wet (HCC)    Migraines    Osteoporosis    TIA (transient ischemic attack)    Type 2 diabetes mellitus with polyneuropathy [facility])    Past Surgical History:  Procedure Laterality Date   CATARACT EXTRACTION W/PHACO Left 05/29/2016   Procedure: CATARACT EXTRACTION PHACO AND INTRAOCULAR LENS PLACEMENT (IOC);  Surgeon: Steven  Dingeldein, MD;  Location: [facility] ORS;  Service: Ophthalmology;  Laterality: Left;  US : 01:48.8 AP% 25.2 CDE: 50.41 ONU#[id number] H   CHOLECYSTECTOMY     COLONOSCOPY     OOPHORECTOMY     TONSILLECTOMY      Allergies  Allergen Reactions   Erythromycin Diarrhea    Outpatient Encounter Medications as of 03/11/2023  Medication Sig   acetaminophen  (TYLENOL ) 325 MG tablet Take 975 mg by mouth every 6 (six) hours as needed.   albuterol  (VENTOLIN  HFA) 108 (90 Base) MCG/ACT inhaler Inhale 2 puffs into the lungs every 4 (four) hours as needed for wheezing or shortness of breath.   cetirizine  (ZYRTEC ) 10 MG tablet Take 10 mg by mouth daily.   Insulin  Glargine (BASAGLAR KWIKPEN) 100 UNIT/ML Inject 45 Units into the skin at bedtime.   metFORMIN  (GLUCOPHAGE -XR) 500 MG 24 hr tablet Take 1,000 mg by mouth 2 (two) times daily with a meal. at   montelukast  (SINGULAIR ) 10 MG tablet Take 10 mg by mouth at bedtime.   rosuvastatin  (CRESTOR ) 10 MG tablet Take 10 mg by mouth daily.   Skin Protectants, Misc. (EUCERIN) cream Apply 1 Application topically daily.   Vitamin D, Ergocalciferol, (DRISDOL) 1.25 MG (50000 UNIT) CAPS capsule Take 50,000 Units by mouth every 7 (seven) days. Wednesday   alum & mag hydroxide-simeth (MAALOX/MYLANTA) 200-200-20 MG/5ML suspension Take 30 mLs by mouth every 4 (four) hours as needed for indigestion or heartburn. (  Patient not taking: Reported on 03/11/2023)   Artificial Tears ophthalmic solution Place 1 drop into both eyes every 12 (twelve) hours as needed. (Patient not taking: Reported on 03/11/2023)   benzocaine (HURRICAINE) 20 % GEL Use as directed 1 Application in the mouth or throat every 2 (two) hours as needed. (Patient not taking: Reported on 03/11/2023)   guaifenesin (ROBITUSSIN) 100 MG/5ML syrup Take 10 mLs by mouth every 4 (four) hours as needed for cough. (Patient not taking: Reported on 03/11/2023)   hydrocortisone  (ANUSOL -HC) 2.5 % rectal cream Place 1 Application rectally 2 (two)  times daily. (Patient not taking: Reported on 03/11/2023)   ondansetron  (ZOFRAN ) 4 MG tablet Take 4 mg by mouth every 8 (eight) hours as needed for nausea or vomiting. (Patient not taking: Reported on 03/11/2023)   polyethylene glycol (MIRALAX  / GLYCOLAX ) packet Take 17 g by mouth daily. (Patient not taking: Reported on 03/11/2023)   sertraline (ZOLOFT) 50 MG tablet Take 50 mg by mouth daily. (Patient not taking: Reported on 03/11/2023)   No facility-administered encounter medications on file as of 03/11/2023.    Review of Systems  Constitutional:  Negative for activity change, appetite change, fatigue and unexpected weight change.  HENT:  Negative for congestion and hearing loss.   Eyes: Negative.   Respiratory:  Negative for cough and shortness of breath.   Cardiovascular:  Negative for chest pain, palpitations and leg swelling.  Gastrointestinal:  Negative for abdominal pain, constipation and diarrhea.  Genitourinary:  Negative for difficulty urinating and dysuria.  Musculoskeletal:  Negative for arthralgias and myalgias.  Skin:  Negative for color change and wound.  Neurological:  Negative for dizziness and weakness.  Psychiatric/Behavioral:  Positive for confusion. Negative for agitation and behavioral problems.      Immunization History  Administered Date(s) Administered   Influenza Inj Mdck Quad Pf 12/15/2017   Influenza, High Dose Seasonal PF 12/18/2021   Influenza-Unspecified 12/03/2018, 12/18/2020, 12/25/2022   MODERNA COVID-19 SARS-COV-2 PEDS BIVALENT BOOSTER 33yr-55yr 07/31/2021   Moderna Covid-19 Vaccine Bivalent Booster 74yrs & up 07/31/2021, 01/11/2022   Moderna Sars-Covid-2 Vaccination 03/15/2019, 04/12/2019, 01/14/2020, 07/21/2020, 11/24/2020   Pneumococcal Conjugate-13 02/16/2015   Tdap 11/26/2013, 04/27/2019   Zoster Recombinant(Shingrix) 09/30/2022   Pertinent  Health Maintenance Due  Topic Date Due   OPHTHALMOLOGY EXAM  05/13/2023   HEMOGLOBIN A1C  07/08/2023   FOOT  EXAM  08/27/2023   INFLUENZA VACCINE  Completed   DEXA SCAN  Discontinued      07/17/2019    6:23 PM 07/22/2019    5:24 AM 08/13/2019    7:08 PM 09/26/2022   10:48 AM 01/03/2023   10:32 PM  Fall Risk  Falls in the past year?    0 1  Was there Pratt injury with Fall?    0 0  Fall Risk Category Calculator    0 2  (RETIRED) Patient Fall Risk Level High fall risk High fall risk High fall risk    Patient at Risk for Falls Due to     History of fall(s);Impaired balance/gait;Impaired mobility  Fall risk Follow up     Falls evaluation completed;Falls prevention discussed;Education provided   Functional Status Survey:    Vitals:   03/11/23 1154  BP: 138/77  Pulse: 100  Resp: 18  Temp: 98 F (36.7 C)  SpO2: 94%  Weight: 130 lb 12.8 oz (59.3 kg)  Height: 5' 2 (1.575 m)   Body mass index is 23.92 kg/m. Physical Exam Constitutional:      General:  She is not in acute distress.    Appearance: She is well-developed. She is not diaphoretic.  HENT:     Head: Normocephalic and atraumatic.     Mouth/Throat:     Pharynx: No oropharyngeal exudate.  Eyes:     Conjunctiva/sclera: Conjunctivae normal.     Pupils: Pupils are equal, round, and reactive to light.  Cardiovascular:     Rate and Rhythm: Normal rate and regular rhythm.     Heart sounds: Normal heart sounds.  Pulmonary:     Effort: Pulmonary effort is normal.     Breath sounds: Normal breath sounds.  Abdominal:     General: Bowel sounds are normal.     Palpations: Abdomen is soft.  Musculoskeletal:     Cervical back: Normal range of motion and neck supple.     Right lower leg: No edema.     Left lower leg: No edema.  Skin:    General: Skin is warm and dry.  Neurological:     Mental Status: She is alert.     Motor: Weakness present.     Gait: Gait abnormal.  Psychiatric:        Mood and Affect: Mood normal.     Labs reviewed: Recent Labs    10/24/22 0000 01/08/23 0000 01/20/23 0000  NA 140 139 139  K 4.5 4.2 4.3   CL 104 103 104  CO2 27* 28* 29*  BUN 15 13 10   CREATININE 0.8 0.8 0.8  CALCIUM  9.1 9.2 9.0   Recent Labs    01/08/23 0000  AST 15  ALT 9  ALKPHOS 84  ALBUMIN 3.7   Recent Labs    06/13/22 0000 07/25/22 0000 01/08/23 0000  WBC 8.9 8.3 11.0  NEUTROABS  --  5,810.00 8,195.00  HGB 12.4 11.7* 12.0  HCT 39 36 38  PLT 260 204 144*   No results found for: TSH Lab Results  Component Value Date   HGBA1C 6.9 01/08/2023   Lab Results  Component Value Date   CHOL 103 07/03/2022   HDL 35 07/03/2022   LDLCALC 45 07/03/2022   TRIG 157 07/03/2022    Significant Diagnostic Results in last 30 days:  No results found.  Assessment/Plan 1. Age-related osteoporosis without current pathological fracture (Primary) -continues on vit d supplement  2. Primary hypertension -Blood pressure well controlled, goal bp <140/90 Continue current medications and dietary modifications follow metabolic panel  3. Type 2 diabetes mellitus with diabetic polyneuropathy, with long-term current use of insulin  (HCC) A1c at goal Will decrease metformin  to 500 mg BID to avoid hypoglycemia  Continues on glargine 45 units at bedtime no hypoglycemia noted.    4. Mild vascular dementia with anxiety (HCC) -Stable, no acute changes in cognitive or functional status, continue supportive care.   5. Mild intermittent asthma without complication Controlled, no worsening shortness of breath Continues on singulair    6. Anxiety Stable, she has been titrated off zoloft and without any increase in anxiety noted   7. Recurrent falls -fall precautions in place No recent falls   Tevan Marian K. Caro BODILY [facility] [phone]

## 2023-04-07 LAB — CBC AND DIFFERENTIAL
HCT: 39 (ref 36–46)
Hemoglobin: 12.2 (ref 12.0–16.0)
Neutrophils Absolute: 5252
Platelets: 192 10*3/uL (ref 150–400)
WBC: 7.6

## 2023-04-07 LAB — COMPREHENSIVE METABOLIC PANEL
Calcium: 9 (ref 8.7–10.7)
eGFR: 69

## 2023-04-07 LAB — BASIC METABOLIC PANEL
BUN: 13 (ref 4–21)
CO2: 29 — AB (ref 13–22)
Chloride: 104 (ref 99–108)
Creatinine: 0.8 (ref 0.5–1.1)
Glucose: 105
Potassium: 4.2 meq/L (ref 3.5–5.1)
Sodium: 138 (ref 137–147)

## 2023-04-07 LAB — CBC: RBC: 4.64 (ref 3.87–5.11)

## 2023-04-21 ENCOUNTER — Non-Acute Institutional Stay (SKILLED_NURSING_FACILITY): Payer: Self-pay | Admitting: Student

## 2023-04-21 DIAGNOSIS — K1120 Sialoadenitis, unspecified: Secondary | ICD-10-CM

## 2023-04-23 ENCOUNTER — Encounter: Payer: Self-pay | Admitting: Student

## 2023-04-23 NOTE — Progress Notes (Signed)
Location:  Other Nursing Home Room Number: [facility] 409A Place of Service:  SNF [phone]) Provider:  Ander Gaster, Benetta Spar, MD  Patient Care Team: Earnestine Mealing, MD as PCP - General [facility] Medicine)  Extended Emergency Contact Information Primary Emergency Contact: Burd,Geoff Address: [address]          [place], [place] [address] [place] Home Phone: [phone] Mobile Phone: [phone] Relation: Son Secondary Emergency Contact: Rolanda Jay Address: [address]          [place], [place] [address] Home Phone: [phone] Mobile Phone: [phone] Relation: Son  Code Status:  Full Code Goals of care: Advanced Directive information    03/11/2023   11:58 AM  Advanced Directives  Does Patient Have a Medical Advance Directive? No  Would patient like information on creating a medical advance directive? No - Patient declined     Chief Complaint  Patient presents with   cheek swelling    HPI:  Pt is a 89 y.o. female seen today for an acute visit for right cheeck swelling and tenderness. Never had this before. It is painful. No tooth/mouth pain.    Past Medical History:  Diagnosis Date   Asthma    as a child   Bell's palsy    Diverticulosis    Environmental and seasonal allergies    Hyperlipidemia    Hypertension    IBS (irritable bowel syndrome)    Lower extremity edema    Macular degeneration, wet (HCC)    Migraines    Osteoporosis    TIA (transient ischemic attack)    Type 2 diabetes mellitus with polyneuropathy [facility])    Past Surgical History:  Procedure Laterality Date   CATARACT EXTRACTION W/PHACO Left 05/29/2016   Procedure: CATARACT EXTRACTION PHACO AND INTRAOCULAR LENS PLACEMENT (IOC);  Surgeon: Sallee Lange, MD;  Location: [facility] ORS;  Service: Ophthalmology;  Laterality: Left;  [place]: 01:48.8 AP% 25.2 CDE: 50.41 VOZ#[id number] H   CHOLECYSTECTOMY     COLONOSCOPY     OOPHORECTOMY     TONSILLECTOMY      Allergies   Allergen Reactions   Erythromycin Diarrhea    Outpatient Encounter Medications as of 04/21/2023  Medication Sig   acetaminophen (TYLENOL) 325 MG tablet Take 975 mg by mouth every 6 (six) hours as needed.   albuterol (VENTOLIN HFA) 108 (90 Base) MCG/ACT inhaler Inhale 2 puffs into the lungs every 4 (four) hours as needed for wheezing or shortness of breath.   cetirizine (ZYRTEC) 10 MG tablet Take 10 mg by mouth daily.   Insulin Glargine (BASAGLAR KWIKPEN) 100 UNIT/ML Inject 45 Units into the skin at bedtime.   metFORMIN (GLUCOPHAGE-XR) 500 MG 24 hr tablet Take 1,000 mg by mouth 2 (two) times daily with a meal. at   montelukast (SINGULAIR) 10 MG tablet Take 10 mg by mouth at bedtime.   rosuvastatin (CRESTOR) 10 MG tablet Take 10 mg by mouth daily.   Skin Protectants, Misc. (EUCERIN) cream Apply 1 Application topically daily.   Vitamin D, Ergocalciferol, (DRISDOL) 1.25 MG (50000 UNIT) CAPS capsule Take 50,000 Units by mouth every 7 (seven) days. Wednesday   No facility-administered encounter medications on file as of 04/21/2023.    Review of Systems  Immunization History  Administered Date(s) Administered   Influenza Inj Mdck Quad Pf 12/15/2017   Influenza, High Dose Seasonal PF 12/18/2021   Influenza-Unspecified 12/03/2018, 12/18/2020, 12/25/2022   MODERNA COVID-19 SARS-COV-2 PEDS BIVALENT BOOSTER 20yr-58yr 07/31/2021   Moderna Covid-19 Vaccine Bivalent Booster 95yrs &  up 07/31/2021, 01/11/2022   Moderna Sars-Covid-2 Vaccination 03/15/2019, 04/12/2019, 01/14/2020, 07/21/2020, 11/24/2020   Pneumococcal Conjugate-13 02/16/2015   Tdap 11/26/2013, 04/27/2019   Zoster Recombinant(Shingrix) 09/30/2022   Pertinent  Health Maintenance Due  Topic Date Due   OPHTHALMOLOGY EXAM  05/13/2023   HEMOGLOBIN A1C  07/08/2023   FOOT EXAM  08/27/2023   INFLUENZA VACCINE  Completed   DEXA SCAN  Discontinued      07/17/2019    6:23 PM 07/22/2019    5:24 AM 08/13/2019    7:08 PM 09/26/2022   10:48 AM  01/03/2023   10:32 PM  Fall Risk  Falls in the past year?    0 1  Was there an injury with Fall?    0 0  Fall Risk Category Calculator    0 2  (RETIRED) Patient Fall Risk Level High fall risk High fall risk High fall risk    Patient at Risk for Falls Due to     History of fall(s);Impaired balance/gait;Impaired mobility  Fall risk Follow up     Falls evaluation completed;Falls prevention discussed;Education provided   Functional Status Survey:    There were no vitals filed for this visit. There is no height or weight on file to calculate BMI. Physical Exam HENT:     Head:     Comments: Right parotid swelling and tenderness to palpation. LAD. No erythema or tenderness    Right Ear: Tympanic membrane normal.     Left Ear: Tympanic membrane normal.  Eyes:     Pupils: Pupils are equal, round, and reactive to light.     Labs reviewed: Recent Labs    10/24/22 0000 01/08/23 0000 01/20/23 0000  NA 140 139 139  K 4.5 4.2 4.3  CL 104 103 104  CO2 27* 28* 29*  BUN 15 13 10   CREATININE 0.8 0.8 0.8  CALCIUM 9.1 9.2 9.0   Recent Labs    01/08/23 0000  AST 15  ALT 9  ALKPHOS 84  ALBUMIN 3.7   Recent Labs    06/13/22 0000 07/25/22 0000 01/08/23 0000  WBC 8.9 8.3 11.0  NEUTROABS  --  5,810.00 8,195.00  HGB 12.4 11.7* 12.0  HCT 39 36 38  PLT 260 204 144*   No results found for: "TSH" Lab Results  Component Value Date   HGBA1C 6.9 01/08/2023   Lab Results  Component Value Date   CHOL 103 07/03/2022   HDL 35 07/03/2022   LDLCALC 45 07/03/2022   TRIG 157 07/03/2022    Significant Diagnostic Results in last 30 days:  No results found.  Assessment/Plan Parotiditis Tenderness and swelling of parotid gland. Encourage warm compresses and sour candy to encourage dislodging of potential sialolithiasis that may be present. PRN tylenol. If pain persists,or no improvement, consider further imaging vs abx.   Family/ staff Communication: nursing  Labs/tests ordered:   none

## 2023-05-23 ENCOUNTER — Encounter: Payer: Self-pay | Admitting: Student

## 2023-05-23 ENCOUNTER — Non-Acute Institutional Stay (SKILLED_NURSING_FACILITY): Payer: Self-pay | Admitting: Student

## 2023-05-23 DIAGNOSIS — C189 Malignant neoplasm of colon, unspecified: Secondary | ICD-10-CM

## 2023-05-23 DIAGNOSIS — I700 Atherosclerosis of aorta: Secondary | ICD-10-CM

## 2023-05-23 DIAGNOSIS — S065X9A Traumatic subdural hemorrhage with loss of consciousness of unspecified duration, initial encounter: Secondary | ICD-10-CM | POA: Diagnosis not present

## 2023-05-23 DIAGNOSIS — I10 Essential (primary) hypertension: Secondary | ICD-10-CM

## 2023-05-23 DIAGNOSIS — E1142 Type 2 diabetes mellitus with diabetic polyneuropathy: Secondary | ICD-10-CM

## 2023-05-23 DIAGNOSIS — G459 Transient cerebral ischemic attack, unspecified: Secondary | ICD-10-CM

## 2023-05-23 DIAGNOSIS — H353230 Exudative age-related macular degeneration, bilateral, stage unspecified: Secondary | ICD-10-CM

## 2023-05-23 NOTE — Progress Notes (Unsigned)
 Location:  Other [facility].  Nursing Home Room Number: [facility] 409A Place of Service:  SNF [phone]) Provider:  Earnestine Mealing, MD  Patient Care Team: Earnestine Mealing, MD as PCP - General [facility] Medicine)  Extended Emergency Contact Information Primary Emergency Contact: Ferdinand,Geoff Address: [address]          [place], [place] [address] [place] of [place] Home Phone: [phone] Mobile Phone: [phone] Relation: Son Secondary Emergency Contact: Rolanda Jay Address: [address]          [place], [place] [address] Home Phone: [phone] Mobile Phone: [phone] Relation: Son  Code Status:  Full Code Goals of care: Advanced Directive information    05/23/2023   12:48 PM  Advanced Directives  Does Patient Have a Medical Advance Directive? No  Would patient like information on creating a medical advance directive? No - Patient declined     Chief Complaint  Patient presents with   Medical Management of Chronic Issues    Medical Management of Chronic Issues.     HPI:  Pt is a 89 y.o. female seen today for medical management of chronic diseases.    She states she is doing well. She doesn't have any concerns at this time. Asked how her jaw is doing since it was recently swollen and she states she doesn't remember that happen  Past Medical History:  Diagnosis Date   Asthma    as a child   Bell's palsy    Diverticulosis    Environmental and seasonal allergies    Hyperlipidemia    Hypertension    IBS (irritable bowel syndrome)    Lower extremity edema    Macular degeneration, wet (HCC)    Migraines    Osteoporosis    TIA (transient ischemic attack)    Type 2 diabetes mellitus with polyneuropathy [facility])    Past Surgical History:  Procedure Laterality Date   CATARACT EXTRACTION W/PHACO Left 05/29/2016   Procedure: CATARACT EXTRACTION PHACO AND INTRAOCULAR LENS PLACEMENT (IOC);  Surgeon: Sallee Lange, MD;  Location: [facility] ORS;  Service:  Ophthalmology;  Laterality: Left;  [place]: 01:48.8 AP% 25.2 CDE: 50.41 VOZ#[id number] H   CHOLECYSTECTOMY     COLONOSCOPY     OOPHORECTOMY     TONSILLECTOMY      Allergies  Allergen Reactions   Erythromycin Diarrhea    Outpatient Encounter Medications as of 05/23/2023  Medication Sig   acetaminophen (TYLENOL) 325 MG tablet Take 975 mg by mouth every 6 (six) hours as needed.   albuterol (VENTOLIN HFA) 108 (90 Base) MCG/ACT inhaler Inhale 2 puffs into the lungs every 4 (four) hours as needed for wheezing or shortness of breath.   BD AUTOSHIELD DUO 30G X 5 MM MISC    cetirizine (ZYRTEC) 10 MG tablet Take 10 mg by mouth daily.   Insulin Glargine (BASAGLAR KWIKPEN) 100 UNIT/ML Inject 45 Units into the skin at bedtime.   metFORMIN (GLUCOPHAGE-XR) 500 MG 24 hr tablet Take 1,000 mg by mouth 2 (two) times daily with a meal. at   montelukast (SINGULAIR) 10 MG tablet Take 10 mg by mouth at bedtime.   rosuvastatin (CRESTOR) 10 MG tablet Take 10 mg by mouth daily.   Skin Protectants, Misc. (EUCERIN) cream Apply 1 Application topically daily.   Vitamin D, Ergocalciferol, (DRISDOL) 1.25 MG (50000 UNIT) CAPS capsule Take 50,000 Units by mouth every 7 (seven) days. Wednesday   No facility-administered encounter medications on file as of 05/23/2023.    Review of Systems  Immunization History  Administered Date(s) Administered   Influenza Inj Mdck Quad Pf 12/15/2017   Influenza, High Dose Seasonal PF 12/18/2021   Influenza-Unspecified 12/03/2018, 12/18/2020, 12/25/2022   MODERNA COVID-19 SARS-COV-2 PEDS BIVALENT BOOSTER 9yr-73yr 07/31/2021   Moderna Covid-19 Vaccine Bivalent Booster 58yrs & up 07/31/2021, 01/11/2022, 11/29/2022   Moderna Sars-Covid-2 Vaccination 03/15/2019, 04/12/2019, 01/14/2020, 07/21/2020, 11/24/2020   Pneumococcal Conjugate-13 02/16/2015   Tdap 11/26/2013, 04/27/2019   Zoster Recombinant(Shingrix) 09/30/2022   Pertinent  Health Maintenance Due  Topic Date Due   HEMOGLOBIN A1C   07/08/2023   FOOT EXAM  08/27/2023   OPHTHALMOLOGY EXAM  11/21/2023   INFLUENZA VACCINE  Completed   DEXA SCAN  Discontinued      07/17/2019    6:23 PM 07/22/2019    5:24 AM 08/13/2019    7:08 PM 09/26/2022   10:48 AM 01/03/2023   10:32 PM  Fall Risk  Falls in the past year?    0 1  Was there an injury with Fall?    0 0  Fall Risk Category Calculator    0 2  (RETIRED) Patient Fall Risk Level High fall risk High fall risk High fall risk    Patient at Risk for Falls Due to     History of fall(s);Impaired balance/gait;Impaired mobility  Fall risk Follow up     Falls evaluation completed;Falls prevention discussed;Education provided   Functional Status Survey:    Vitals:   05/23/23 1240 05/23/23 1337  BP: (!) 152/72 138/77  Pulse: 80   Resp: 18   Temp: 98 F (36.7 C)   SpO2: 97%   Weight: 131 lb (59.4 kg)   Height: 5\' 2"  (1.575 m)    Body mass index is 23.96 kg/m. Physical Exam Constitutional:      Appearance: Normal appearance.  Cardiovascular:     Rate and Rhythm: Normal rate and regular rhythm.     Pulses: Normal pulses.     Heart sounds: Normal heart sounds.  Pulmonary:     Effort: Pulmonary effort is normal.  Abdominal:     General: Abdomen is flat. Bowel sounds are normal.     Palpations: Abdomen is soft.  Musculoskeletal:        General: No swelling or tenderness.  Skin:    General: Skin is warm and dry.  Neurological:     Mental Status: She is alert and oriented to person, place, and time.     Gait: Gait normal.  Psychiatric:        Mood and Affect: Mood normal.     Labs reviewed: Recent Labs    01/08/23 0000 01/20/23 0000 04/07/23 0000  NA 139 139 138  K 4.2 4.3 4.2  CL 103 104 104  CO2 28* 29* 29*  BUN 13 10 13   CREATININE 0.8 0.8 0.8  CALCIUM 9.2 9.0 9.0   Recent Labs    01/08/23 0000  AST 15  ALT 9  ALKPHOS 84  ALBUMIN 3.7   Recent Labs    07/25/22 0000 01/08/23 0000 04/07/23 0000  WBC 8.3 11.0 7.6  NEUTROABS 5,810.00  8,195.00 5,252.00  HGB 11.7* 12.0 12.2  HCT 36 38 39  PLT 204 144* 192   No results found for: "TSH" Lab Results  Component Value Date   HGBA1C 6.9 01/08/2023   Lab Results  Component Value Date   CHOL 103 07/03/2022   HDL 35 07/03/2022   LDLCALC 45 07/03/2022   TRIG 157 07/03/2022    Significant Diagnostic Results in last 30 days:  No results found.  Assessment/Plan 1. 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   2. Malignant neoplasm of colon, unspecified part of colon (HCC) No active signs of bleeding at this time.   3. Traumatic subdural hematoma with loss of consciousness, initial encounter [facility]) TIA (transient ischemic attack) Atherosclerosis of aorta (HCC) Injury on 08/14/2019. Some signs of memory loss, will have further evaluation with [facility] and continue goals of care conversations with patient and family. Patient should have ASA, however, given history of bleed. Continue Crestor.   5. Primary hypertension BP well-controlled without medications at this time.   7. Type 2 diabetes mellitus with polyneuropathy (HCC) Most recent A1c at goal. Continue Metformin.   Family/ staff Communication: Nursing  Labs/tests ordered:  A1c, CBC, CMP q51mo

## 2023-05-26 ENCOUNTER — Encounter: Payer: Self-pay | Admitting: Student

## 2023-05-27 ENCOUNTER — Encounter: Payer: Self-pay | Admitting: Student

## 2023-07-03 ENCOUNTER — Non-Acute Institutional Stay (SKILLED_NURSING_FACILITY): Payer: Self-pay | Admitting: Nurse Practitioner

## 2023-07-03 ENCOUNTER — Encounter: Payer: Self-pay | Admitting: Nurse Practitioner

## 2023-07-03 DIAGNOSIS — E559 Vitamin D deficiency, unspecified: Secondary | ICD-10-CM

## 2023-07-03 DIAGNOSIS — F01A4 Vascular dementia, mild, with anxiety: Secondary | ICD-10-CM

## 2023-07-03 DIAGNOSIS — E1142 Type 2 diabetes mellitus with diabetic polyneuropathy: Secondary | ICD-10-CM | POA: Diagnosis not present

## 2023-07-03 DIAGNOSIS — Z794 Long term (current) use of insulin: Secondary | ICD-10-CM

## 2023-07-03 DIAGNOSIS — J3089 Other allergic rhinitis: Secondary | ICD-10-CM

## 2023-07-03 DIAGNOSIS — I10 Essential (primary) hypertension: Secondary | ICD-10-CM | POA: Diagnosis not present

## 2023-07-03 DIAGNOSIS — E781 Pure hyperglyceridemia: Secondary | ICD-10-CM

## 2023-07-03 DIAGNOSIS — J4520 Mild intermittent asthma, uncomplicated: Secondary | ICD-10-CM

## 2023-07-03 DIAGNOSIS — I700 Atherosclerosis of aorta: Secondary | ICD-10-CM

## 2023-07-03 LAB — HEMOGLOBIN A1C: Hemoglobin A1C: 6.4

## 2023-07-03 LAB — BASIC METABOLIC PANEL WITH GFR
BUN: 10 (ref 4–21)
CO2: 28 — AB (ref 13–22)
Chloride: 101 (ref 99–108)
Creatinine: 0.8 (ref 0.5–1.1)
Glucose: 114
Potassium: 4 meq/L (ref 3.5–5.1)
Sodium: 140 (ref 137–147)

## 2023-07-03 LAB — CBC AND DIFFERENTIAL
HCT: 38 (ref 36–46)
Hemoglobin: 11.8 — AB (ref 12.0–16.0)
Neutrophils Absolute: 4882
Platelets: 153 K/uL (ref 150–400)
WBC: 7.2

## 2023-07-03 LAB — LIPID PANEL
Cholesterol: 109 (ref 0–200)
HDL: 42 (ref 35–70)
LDL Cholesterol: 46
Triglycerides: 133 (ref 40–160)

## 2023-07-03 LAB — COMPREHENSIVE METABOLIC PANEL WITH GFR
Calcium: 9.4 (ref 8.7–10.7)
eGFR: 66

## 2023-07-03 LAB — CBC: RBC: 4.54 (ref 3.87–5.11)

## 2023-07-03 NOTE — Progress Notes (Unsigned)
 Location[place]  [place].  Nursing Home Room Number: [facility] 409A Place of Service:  SNF [phone]) Gilbert Lab, NP  PCP: Valrie Gehrig, MD  Patient Care Team: Valrie Gehrig, MD as PCP - General [facility] Medicine)  Extended Emergency Contact Information Primary Emergency Contact: Kirkeby,Geoff Address: [address]          [place], [place] [address] [place] Home Phone: [phone] Mobile Phone: [phone] Relation: Son Secondary Emergency Contact: Judithann Novas Address: [address]          [place], [place] [address] Home Phone: [phone] Mobile Phone: [phone] Relation: Son  Goals of care: Advanced Directive information    05/23/2023   12:48 PM  Advanced Directives  Does Patient Have a Medical Advance Directive? No  Would patient like information on creating a medical advance directive? No - Patient declined     Chief Complaint  Patient presents with   Medical Management of Chronic Issues    Medical Management of Chronic Issues.     HPI:  Pt is a 89 y.o. female seen today for medical management of chronic disease. Pt with hx of asthma, htn, DM, allergies, IBS being seen today for routine follow up She has no complaints today and reports she is doing well. Denies pain. Staff has no concerns at this time.    Past Medical History:  Diagnosis Date   Asthma    as a child   Bell's palsy    Diverticulosis    Environmental and seasonal allergies    Hyperlipidemia    Hypertension    IBS (irritable bowel syndrome)    Lower extremity edema    Macular degeneration, wet (HCC)    Migraines    Osteoporosis    TIA (transient ischemic attack)    Type 2 diabetes mellitus with polyneuropathy [facility])    Past Surgical History:  Procedure Laterality Date   CATARACT EXTRACTION W/PHACO Left 05/29/2016   Procedure: CATARACT EXTRACTION PHACO AND INTRAOCULAR LENS PLACEMENT (IOC);  Surgeon: Steven Dingeldein, MD;  Location: [facility] ORS;   Service: Ophthalmology;  Laterality: Left;  US : 01:48.8 AP% 25.2 CDE: 50.41 [id number] H   CHOLECYSTECTOMY     COLONOSCOPY     OOPHORECTOMY     TONSILLECTOMY      Allergies  Allergen Reactions   Erythromycin Diarrhea    Outpatient Encounter Medications as of 07/03/2023  Medication Sig   acetaminophen  (TYLENOL ) 500 MG tablet Take 1,000 mg by mouth every 8 (eight) hours as needed.   cetirizine  (ZYRTEC ) 10 MG tablet Take 10 mg by mouth daily.   Insulin  Glargine (BASAGLAR KWIKPEN) 100 UNIT/ML Inject 45 Units into the skin at bedtime.   metFORMIN  (GLUCOPHAGE -XR) 500 MG 24 hr tablet Take 1,000 mg by mouth 2 (two) times daily with a meal. at   montelukast  (SINGULAIR ) 10 MG tablet Take 10 mg by mouth at bedtime.   rosuvastatin  (CRESTOR ) 10 MG tablet Take 10 mg by mouth daily.   Skin Protectants, Misc. (EUCERIN) cream Apply 1 Application topically daily.   Vitamin D, Ergocalciferol, (DRISDOL) 1.25 MG (50000 UNIT) CAPS capsule Take 50,000 Units by mouth every 7 (seven) days. Wednesday   [DISCONTINUED] acetaminophen  (TYLENOL ) 325 MG tablet Take 975 mg by mouth every 6 (six) hours as needed. (Patient not taking: Reported on 07/03/2023)   [DISCONTINUED] albuterol  (VENTOLIN  HFA) 108 (90 Base) MCG/ACT inhaler Inhale 2 puffs into the lungs every 4 (four) hours as needed for wheezing or shortness of breath. (Patient not taking: Reported on  07/03/2023)   [DISCONTINUED] BD AUTOSHIELD DUO 30G X 5 MM MISC  (Patient not taking: Reported on 07/03/2023)   No facility-administered encounter medications on file as of 07/03/2023.    Review of Systems  Constitutional:  Negative for activity change, appetite change, fatigue and unexpected weight change.  HENT:  Negative for congestion and hearing loss.   Eyes: Negative.   Respiratory:  Negative for cough and shortness of breath.   Cardiovascular:  Negative for chest pain, palpitations and leg swelling.  Gastrointestinal:  Negative for abdominal pain, constipation and  diarrhea.  Genitourinary:  Negative for difficulty urinating and dysuria.  Musculoskeletal:  Negative for arthralgias and myalgias.  Skin:  Negative for color change and wound.  Neurological:  Negative for dizziness and weakness.  Psychiatric/Behavioral:  Negative for agitation, behavioral problems and confusion.      Immunization History  Administered Date(s) Administered   Influenza Inj Mdck Quad Pf 12/15/2017   Influenza, High Dose Seasonal PF 12/18/2021   Influenza-Unspecified 12/03/2018, 12/18/2020, 12/25/2022   MODERNA COVID-19 SARS-COV-2 PEDS BIVALENT BOOSTER 73yr-62yr 07/31/2021   Moderna Covid-19 Vaccine Bivalent Booster 59yrs & up 07/31/2021, 01/11/2022, 11/29/2022   Moderna Sars-Covid-2 Vaccination 03/15/2019, 04/12/2019, 01/14/2020, 07/21/2020, 11/24/2020   Pneumococcal Conjugate-13 02/16/2015   Tdap 11/26/2013, 04/27/2019   Unspecified SARS-COV-2 Vaccination 06/13/2023   Zoster Recombinant(Shingrix) 09/30/2022   Pertinent  Health Maintenance Due  Topic Date Due   HEMOGLOBIN A1C  07/08/2023   FOOT EXAM  08/27/2023   INFLUENZA VACCINE  10/03/2023   OPHTHALMOLOGY EXAM  11/21/2023   DEXA SCAN  Discontinued      07/17/2019    6:23 PM 07/22/2019    5:24 AM 08/13/2019    7:08 PM 09/26/2022   10:48 AM 01/03/2023   10:32 PM  Fall Risk  Falls in the past year?    0 1  Was there an injury with Fall?    0 0  Fall Risk Category Calculator    0 2  (RETIRED) Patient Fall Risk Level High fall risk High fall risk High fall risk    Patient at Risk for Falls Due to     History of fall(s);Impaired balance/gait;Impaired mobility  Fall risk Follow up     Falls evaluation completed;Falls prevention discussed;Education provided   Functional Status Survey:    Vitals:   07/03/23 1236  BP: 134/84  Pulse: 100  Resp: 18  Temp: 97.8 F (36.6 C)  SpO2: 97%  Weight: 129 lb (58.5 kg)  Height: 5\' 2"  (1.575 m)   Body mass index is 23.59 kg/m. Physical Exam Constitutional:       General: She is not in acute distress.    Appearance: She is well-developed. She is not diaphoretic.  HENT:     Head: Normocephalic and atraumatic.     Mouth/Throat:     Pharynx: No oropharyngeal exudate.  Eyes:     Conjunctiva/sclera: Conjunctivae normal.     Pupils: Pupils are equal, round, and reactive to light.  Cardiovascular:     Rate and Rhythm: Normal rate and regular rhythm.     Heart sounds: Normal heart sounds.  Pulmonary:     Effort: Pulmonary effort is normal.     Breath sounds: Normal breath sounds.  Abdominal:     General: Bowel sounds are normal.     Palpations: Abdomen is soft.  Musculoskeletal:     Cervical back: Normal range of motion and neck supple.     Right lower leg: No edema.  Left lower leg: No edema.  Skin:    General: Skin is warm and dry.  Neurological:     Mental Status: She is alert.  Psychiatric:        Mood and Affect: Mood normal.     Labs reviewed: Recent Labs    01/08/23 0000 01/20/23 0000 04/07/23 0000  NA 139 139 138  K 4.2 4.3 4.2  CL 103 104 104  CO2 28* 29* 29*  BUN 13 10 13   CREATININE 0.8 0.8 0.8  CALCIUM  9.2 9.0 9.0   Recent Labs    01/08/23 0000  AST 15  ALT 9  ALKPHOS 84  ALBUMIN 3.7   Recent Labs    07/25/22 0000 01/08/23 0000 04/07/23 0000  WBC 8.3 11.0 7.6  NEUTROABS 5,810.00 8,195.00 5,252.00  HGB 11.7* 12.0 12.2  HCT 36 38 39  PLT 204 144* 192   No results found for: "TSH" Lab Results  Component Value Date   HGBA1C 6.9 01/08/2023   Lab Results  Component Value Date   CHOL 103 07/03/2022   HDL 35 07/03/2022   LDLCALC 45 07/03/2022   TRIG 157 07/03/2022    Significant Diagnostic Results in last 30 days:  No results found.  Assessment/Plan Atherosclerosis of aorta (HCC) Noted on imaging, continues on statin  Environmental and seasonal allergies Stable on zyrtec  and Singulair    Hyperlipidemia Continues on crestor , monitor lipids yearly (due in this month) Stable on last  lab  Hypertension Htn controlled off medication Continue to monitor  Mild intermittent asthma without complication Stable without exacerbations Continue on singulair    Mild vascular dementia with anxiety (HCC) Stable, no acute changes in cognitive or functional status, continue supportive care.   Type 2 diabetes mellitus with diabetic polyneuropathy, with long-term current use of insulin  (HCC) Encouraged dietary compliance, routine foot care/monitoring and to keep up with diabetic eye exams through ophthalmology  Continues glargine and metformin , A1c will be checked this month.  Vitamin D deficiency Continue on vit d supplement   Jessica K. Denney Fisherman [facility] [phone]

## 2023-07-10 DIAGNOSIS — F01A4 Vascular dementia, mild, with anxiety: Secondary | ICD-10-CM | POA: Insufficient documentation

## 2023-07-10 DIAGNOSIS — F01B0 Vascular dementia, moderate, without behavioral disturbance, psychotic disturbance, mood disturbance, and anxiety: Secondary | ICD-10-CM | POA: Insufficient documentation

## 2023-07-10 DIAGNOSIS — E559 Vitamin D deficiency, unspecified: Secondary | ICD-10-CM | POA: Insufficient documentation

## 2023-07-10 NOTE — Assessment & Plan Note (Signed)
 Stable without exacerbations Continue on singulair 

## 2023-07-10 NOTE — Assessment & Plan Note (Addendum)
 Continues on crestor , monitor lipids yearly (due in this month) Stable on last lab

## 2023-07-10 NOTE — Assessment & Plan Note (Signed)
 Stable, no acute changes in cognitive or functional status, continue supportive care.

## 2023-07-10 NOTE — Assessment & Plan Note (Signed)
 Stable on zyrtec  and Singulair 

## 2023-07-10 NOTE — Assessment & Plan Note (Signed)
 Noted on imaging, continues on statin

## 2023-07-10 NOTE — Assessment & Plan Note (Signed)
 Encouraged dietary compliance, routine foot care/monitoring and to keep up with diabetic eye exams through ophthalmology  Continues glargine and metformin , A1c will be checked this month.

## 2023-07-10 NOTE — Assessment & Plan Note (Signed)
Continue on vit d  supplement

## 2023-07-10 NOTE — Assessment & Plan Note (Signed)
 Htn controlled off medication Continue to monitor

## 2023-08-05 ENCOUNTER — Non-Acute Institutional Stay (SKILLED_NURSING_FACILITY): Payer: Self-pay | Admitting: Adult Health

## 2023-08-05 ENCOUNTER — Encounter: Payer: Self-pay | Admitting: Adult Health

## 2023-08-05 DIAGNOSIS — Z794 Long term (current) use of insulin: Secondary | ICD-10-CM

## 2023-08-05 DIAGNOSIS — E1142 Type 2 diabetes mellitus with diabetic polyneuropathy: Secondary | ICD-10-CM

## 2023-08-05 DIAGNOSIS — F01B0 Vascular dementia, moderate, without behavioral disturbance, psychotic disturbance, mood disturbance, and anxiety: Secondary | ICD-10-CM | POA: Diagnosis not present

## 2023-08-05 NOTE — Progress Notes (Unsigned)
 Location:  [facility] Nursing Home Room Number: 409 A Place of Service:  SNF [phone]) Provider:  Medina-Vargas, Dariel Betzer, DNP, FNP-BC  Patient Care Team: Valrie Gehrig, MD as PCP - General [facility] Medicine)  Extended Emergency Contact Information Primary Emergency Contact: Farrington,Geoff Address: [address]          [place], [place] [address] [place] Home Phone: [phone] Mobile Phone: [phone] Relation: Son Secondary Emergency Contact: Judithann Novas Address: [address]          [place], [place] [address] Home Phone: [phone] Mobile Phone: [phone] Relation: Son  Code Status:   Full Code  Goals of care: Advanced Directive information    05/23/2023   12:48 PM  Advanced Directives  Does Patient Have a Medical Advance Directive? No  Would patient like information on creating a medical advance directive? No - Patient declined     Chief Complaint  Patient presents with   Acute Visit    Low blood sugar    HPI:  Pt is a 89 y.o. female seen today for an acute visit regarding low blood sugar.   Past Medical History:  Diagnosis Date   Asthma    as a child   Bell's palsy    Diverticulosis    Environmental and seasonal allergies    Hyperlipidemia    Hypertension    IBS (irritable bowel syndrome)    Lower extremity edema    Macular degeneration, wet (HCC)    Migraines    Osteoporosis    TIA (transient ischemic attack)    Type 2 diabetes mellitus with polyneuropathy [facility])    Past Surgical History:  Procedure Laterality Date   CATARACT EXTRACTION W/PHACO Left 05/29/2016   Procedure: CATARACT EXTRACTION PHACO AND INTRAOCULAR LENS PLACEMENT (IOC);  Surgeon: Steven Dingeldein, MD;  Location: [facility] ORS;  Service: Ophthalmology;  Laterality: Left;  US : 01:48.8 AP% 25.2 CDE: 50.41 VOZ#[id number] H   CHOLECYSTECTOMY     COLONOSCOPY     OOPHORECTOMY     TONSILLECTOMY      Allergies  Allergen Reactions    Erythromycin Diarrhea    Outpatient Encounter Medications as of 08/05/2023  Medication Sig   acetaminophen  (TYLENOL ) 500 MG tablet Take 1,000 mg by mouth every 8 (eight) hours as needed.   cetirizine  (ZYRTEC ) 10 MG tablet Take 10 mg by mouth daily.   Insulin  Glargine (BASAGLAR KWIKPEN) 100 UNIT/ML Inject 45 Units into the skin at bedtime.   metFORMIN  (GLUCOPHAGE -XR) 500 MG 24 hr tablet Take 1,000 mg by mouth 2 (two) times daily with a meal. at   montelukast  (SINGULAIR ) 10 MG tablet Take 10 mg by mouth at bedtime.   rosuvastatin  (CRESTOR ) 10 MG tablet Take 10 mg by mouth daily.   Skin Protectants, Misc. (EUCERIN) cream Apply 1 Application topically daily.   Vitamin D, Ergocalciferol, (DRISDOL) 1.25 MG (50000 UNIT) CAPS capsule Take 50,000 Units by mouth every 7 (seven) days. Wednesday   No facility-administered encounter medications on file as of 08/05/2023.    Review of Systems ***    Immunization History  Administered Date(s) Administered   Influenza Inj Mdck Quad Pf 12/15/2017   Influenza, High Dose Seasonal PF 12/18/2021   Influenza-Unspecified 12/03/2018, 12/18/2020, 12/25/2022   MODERNA COVID-19 SARS-COV-2 PEDS BIVALENT BOOSTER 72yr-47yr 07/31/2021   Moderna Covid-19 Vaccine Bivalent Booster 70yrs & up 07/31/2021, 01/11/2022, 11/29/2022   Moderna Sars-Covid-2 Vaccination 03/15/2019, 04/12/2019, 01/14/2020, 07/21/2020, 11/24/2020   Pneumococcal Conjugate-13 02/16/2015   Tdap 11/26/2013, 04/27/2019  Unspecified SARS-COV-2 Vaccination 06/13/2023   Zoster Recombinant(Shingrix) 09/30/2022   Pertinent  Health Maintenance Due  Topic Date Due   HEMOGLOBIN A1C  07/08/2023   FOOT EXAM  08/27/2023   INFLUENZA VACCINE  10/03/2023   OPHTHALMOLOGY EXAM  11/21/2023   DEXA SCAN  Discontinued      07/17/2019    6:23 PM 07/22/2019    5:24 AM 08/13/2019    7:08 PM 09/26/2022   10:48 AM 01/03/2023   10:32 PM  Fall Risk  Falls in the past year?    0 1  Was there an injury with Fall?    0 0   Fall Risk Category Calculator    0 2  (RETIRED) Patient Fall Risk Level High fall risk High fall risk High fall risk    Patient at Risk for Falls Due to     History of fall(s);Impaired balance/gait;Impaired mobility  Fall risk Follow up     Falls evaluation completed;Falls prevention discussed;Education provided     Vitals:   08/05/23 1705  BP: (!) 151/82  Pulse: 87  Resp: 17  Temp: 97.7 F (36.5 C)  SpO2: 95%  Weight: 107 lb 12.8 oz (48.9 kg)  Height: 5\' 2"  (1.575 m)   Body mass index is 19.72 kg/m.  Physical Exam Constitutional:      Appearance: Normal appearance.  HENT:     Head: Normocephalic and atraumatic.     Nose: Nose normal.     Mouth/Throat:     Mouth: Mucous membranes are moist.  Eyes:     Conjunctiva/sclera: Conjunctivae normal.  Cardiovascular:     Rate and Rhythm: Normal rate and regular rhythm.  Pulmonary:     Effort: Pulmonary effort is normal.     Breath sounds: Normal breath sounds.  Abdominal:     General: Bowel sounds are normal.     Palpations: Abdomen is soft.  Musculoskeletal:        General: Normal range of motion.     Cervical back: Normal range of motion.  Skin:    General: Skin is warm and dry.  Neurological:     General: No focal deficit present.     Mental Status: She is alert and oriented to person, place, and time.  Psychiatric:        Mood and Affect: Mood normal.        Behavior: Behavior normal.        Thought Content: Thought content normal.        Judgment: Judgment normal.        Labs reviewed: Recent Labs    01/08/23 0000 01/20/23 0000 04/07/23 0000  NA 139 139 138  K 4.2 4.3 4.2  CL 103 104 104  CO2 28* 29* 29*  BUN 13 10 13   CREATININE 0.8 0.8 0.8  CALCIUM  9.2 9.0 9.0   Recent Labs    01/08/23 0000  AST 15  ALT 9  ALKPHOS 84  ALBUMIN 3.7   Recent Labs    01/08/23 0000 04/07/23 0000  WBC 11.0 7.6  NEUTROABS 8,195.00 5,252.00  HGB 12.0 12.2  HCT 38 39  PLT 144* 192   No results found for:  "TSH" Lab Results  Component Value Date   HGBA1C 6.9 01/08/2023   Lab Results  Component Value Date   CHOL 103 07/03/2022   HDL 35 07/03/2022   LDLCALC 45 07/03/2022   TRIG 157 07/03/2022    Significant Diagnostic Results in last 30 days:  No results found.  Assessment/Plan ***   Family/ staff Communication: Discussed plan of care with resident and charge nurse  Labs/tests ordered:     Foy Vanduyne Medina-Vargas, DNP, MSN, FNP-BC [facility] [phone] (Monday-Friday 8:00 a.m. - 5:00 p.m.) [phone] (after hours)

## 2023-09-17 ENCOUNTER — Encounter: Payer: Self-pay | Admitting: Student

## 2023-09-17 ENCOUNTER — Non-Acute Institutional Stay (SKILLED_NURSING_FACILITY): Payer: Self-pay | Admitting: Student

## 2023-09-17 DIAGNOSIS — E559 Vitamin D deficiency, unspecified: Secondary | ICD-10-CM

## 2023-09-17 DIAGNOSIS — F01B0 Vascular dementia, moderate, without behavioral disturbance, psychotic disturbance, mood disturbance, and anxiety: Secondary | ICD-10-CM

## 2023-09-17 DIAGNOSIS — I700 Atherosclerosis of aorta: Secondary | ICD-10-CM | POA: Diagnosis not present

## 2023-09-17 DIAGNOSIS — E1142 Type 2 diabetes mellitus with diabetic polyneuropathy: Secondary | ICD-10-CM

## 2023-09-17 DIAGNOSIS — S065XAA Traumatic subdural hemorrhage with loss of consciousness status unknown, initial encounter: Secondary | ICD-10-CM

## 2023-09-17 DIAGNOSIS — I10 Essential (primary) hypertension: Secondary | ICD-10-CM | POA: Diagnosis not present

## 2023-09-17 DIAGNOSIS — Z794 Long term (current) use of insulin: Secondary | ICD-10-CM

## 2023-09-17 DIAGNOSIS — H353230 Exudative age-related macular degeneration, bilateral, stage unspecified: Secondary | ICD-10-CM

## 2023-09-17 NOTE — Progress Notes (Signed)
 Location:  Other [facility].  Nursing Home Room Number: [facility] [address]A Place of Service:  SNF [phone]) Provider:  Abdul Fine, MD  Patient Care Team: Abdul Fine, MD as PCP - General [facility] Medicine)  Extended Emergency Contact Information Primary Emergency Contact: Vallee,Geoff Address: [address]          [place], [place] [address] [place] Home Phone: [phone] Mobile Phone: [phone] Relation: Son Secondary Emergency Contact: [name] Address: [address]          [place], [place] [address] Home Phone: [phone] Mobile Phone: [phone] Relation: Son  Code Status:  Full Code Goals of care: Advanced Directive information    09/17/2023   10:38 AM  Advanced Directives  Does Patient Have a Medical Advance Directive? No  Would patient like information on creating a medical advance directive? No - Patient declined     Chief Complaint  Patient presents with   Medical Management of Chronic Issues    Medical Management of Chronic Issues.     HPI:  Pt is a 89 y.o. female seen today for medical management of chronic diseases.   Patient states she is doing well. She had a great birthday - as I haven't seen her since that time. She states she's almost 89. Denies concerns at this time. Denies pain as well.   Nursing continues to monitor glucose closely since she has had lower glucose levels (less than 80). Stable on current dosing.   Past Medical History:  Diagnosis Date   Asthma    as a child   Bell's palsy    Diverticulosis    Environmental and seasonal allergies    Hyperlipidemia    Hypertension    IBS (irritable bowel syndrome)    Lower extremity edema    Macular degeneration, wet (HCC)    Migraines    Osteoporosis    TIA (transient ischemic attack)    Type 2 diabetes mellitus with polyneuropathy [facility])    Past Surgical History:  Procedure Laterality Date   CATARACT EXTRACTION W/PHACO Left 05/29/2016    Procedure: CATARACT EXTRACTION PHACO AND INTRAOCULAR LENS PLACEMENT (IOC);  Surgeon: Steven Dingeldein, MD;  Location: [facility] ORS;  Service: Ophthalmology;  Laterality: Left;  US : 01:48.8 AP% 25.2 CDE: 50.41 ONU#[id number] H   CHOLECYSTECTOMY     COLONOSCOPY     OOPHORECTOMY     TONSILLECTOMY      Allergies  Allergen Reactions   Erythromycin Diarrhea    Outpatient Encounter Medications as of 09/17/2023  Medication Sig   acetaminophen  (TYLENOL ) 500 MG tablet Take 1,000 mg by mouth every 8 (eight) hours as needed.   cetirizine  (ZYRTEC ) 10 MG tablet Take 10 mg by mouth daily.   Insulin  Glargine (BASAGLAR KWIKPEN) 100 UNIT/ML Inject 45 Units into the skin at bedtime. (Patient taking differently: Inject 10 Units into the skin at bedtime.)   metFORMIN  (GLUCOPHAGE -XR) 500 MG 24 hr tablet Take 1,000 mg by mouth 2 (two) times daily with a meal. at   montelukast  (SINGULAIR ) 10 MG tablet Take 10 mg by mouth at bedtime.   rosuvastatin  (CRESTOR ) 10 MG tablet Take 10 mg by mouth daily.   Skin Protectants, Misc. (EUCERIN) cream Apply 1 Application topically daily.   triamcinolone cream (KENALOG) 0.1 % Apply 1 Application topically 2 (two) times daily.   Vitamin D, Ergocalciferol, (DRISDOL) 1.25 MG (50000 UNIT) CAPS capsule Take 50,000 Units by mouth every 7 (seven) days. Wednesday   No facility-administered encounter medications on file as  of 09/17/2023.    Review of Systems  Immunization History  Administered Date(s) Administered   Influenza Inj Mdck Quad Pf 12/15/2017   Influenza, High Dose Seasonal PF 12/18/2021   Influenza-Unspecified 12/03/2018, 12/18/2020, 12/25/2022   MODERNA COVID-19 SARS-COV-2 PEDS BIVALENT BOOSTER 55yr-47yr 07/31/2021   Moderna Covid-19 Vaccine Bivalent Booster 74yrs & up 07/31/2021, 01/11/2022, 11/29/2022   Moderna Sars-Covid-2 Vaccination 03/15/2019, 04/12/2019, 01/14/2020, 07/21/2020, 11/24/2020   Pneumococcal Conjugate-13 02/16/2015   Tdap 11/26/2013, 04/27/2019    Unspecified SARS-COV-2 Vaccination 06/13/2023   Zoster Recombinant(Shingrix) 09/30/2022   Pertinent  Health Maintenance Due  Topic Date Due   FOOT EXAM  08/27/2023   INFLUENZA VACCINE  10/03/2023   OPHTHALMOLOGY EXAM  11/21/2023   HEMOGLOBIN A1C  01/03/2024   DEXA SCAN  Discontinued      07/17/2019    6:23 PM 07/22/2019    5:24 AM 08/13/2019    7:08 PM 09/26/2022   10:48 AM 01/03/2023   10:32 PM  Fall Risk  Falls in the past year?    0 1  Was there an injury with Fall?    0 0  Fall Risk Category Calculator    0 2  (RETIRED) Patient Fall Risk Level High fall risk  High fall risk  High fall risk     Patient at Risk for Falls Due to     History of fall(s);Impaired balance/gait;Impaired mobility  Fall risk Follow up     Falls evaluation completed;Falls prevention discussed;Education provided     Data saved with a previous flowsheet row definition   Functional Status Survey:    Vitals:   09/17/23 1020 09/17/23 1038  BP: (!) 151/82 (!) 171/69  Pulse: 87   Resp: 20   Temp: 98.7 F (37.1 C)   SpO2: 95%   Weight: 126 lb (57.2 kg)   Height: 5' 2 (1.575 m)    Body mass index is 23.05 kg/m. Physical Exam Constitutional:      Appearance: Normal appearance.  Cardiovascular:     Rate and Rhythm: Normal rate and regular rhythm.     Pulses: Normal pulses.     Heart sounds: Normal heart sounds.  Pulmonary:     Effort: Pulmonary effort is normal.  Abdominal:     General: Abdomen is flat. Bowel sounds are normal.     Palpations: Abdomen is soft.  Musculoskeletal:        General: No swelling or tenderness.  Skin:    General: Skin is warm and dry.  Neurological:     Mental Status: She is alert.     Gait: Gait normal.     Labs reviewed: Recent Labs    01/20/23 0000 04/07/23 0000 07/03/23 0000  NA 139 138 140  K 4.3 4.2 4.0  CL 104 104 101  CO2 29* 29* 28*  BUN 10 13 10   CREATININE 0.8 0.8 0.8  CALCIUM  9.0 9.0 9.4   Recent Labs    01/08/23 0000  AST 15  ALT 9   ALKPHOS 84  ALBUMIN 3.7   Recent Labs    01/08/23 0000 04/07/23 0000 07/03/23 0000  WBC 11.0 7.6 7.2  NEUTROABS 8,195.00 5,252.00 4,882.00  HGB 12.0 12.2 11.8*  HCT 38 39 38  PLT 144* 192 153   No results found for: TSH Lab Results  Component Value Date   HGBA1C 6.4 07/03/2023   Lab Results  Component Value Date   CHOL 109 07/03/2023   HDL 42 07/03/2023   LDLCALC 46 07/03/2023  TRIG 133 07/03/2023    Significant Diagnostic Results in last 30 days:  No results found.  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 Diabetes well controlled with last A1c well within goal. Previously having lower glucose with insulin  dosing. Will plan to continue daily glucose checks, if perisstently less than 100 consider discontinuing completely. History of SDH from 07/2019. No recent falls with injury. Continues to requires support with ADLs. Wheelchair dependent. BP slightly above goal at this time. Continue weekly BP checks to determine need of increase in medication management. Visual impairment secondary to macular degeneration. Wears corrective lenses as well. Continue supportive care and GOC conversations with family as patinet remains full code at this time.   Family/ staff Communication: nursing  Labs/tests ordered:  none

## 2023-10-06 LAB — CBC AND DIFFERENTIAL
HCT: 38 (ref 36–46)
Hemoglobin: 11.8 — AB (ref 12.0–16.0)
Neutrophils Absolute: 6759
Platelets: 154 K/uL (ref 150–400)
WBC: 9.4

## 2023-10-06 LAB — BASIC METABOLIC PANEL WITH GFR
BUN: 9 (ref 4–21)
CO2: 28 — AB (ref 13–22)
Chloride: 101 (ref 99–108)
Creatinine: 0.7 (ref 0.5–1.1)
Glucose: 113
Potassium: 4.5 meq/L (ref 3.5–5.1)
Sodium: 139 (ref 137–147)

## 2023-10-06 LAB — COMPREHENSIVE METABOLIC PANEL WITH GFR
Calcium: 9.3 (ref 8.7–10.7)
eGFR: 79

## 2023-10-06 LAB — CBC: RBC: 4.54 (ref 3.87–5.11)

## 2023-10-09 ENCOUNTER — Encounter: Payer: Self-pay | Admitting: Nurse Practitioner

## 2023-10-09 ENCOUNTER — Non-Acute Institutional Stay (SKILLED_NURSING_FACILITY): Payer: Self-pay | Admitting: Nurse Practitioner

## 2023-10-09 DIAGNOSIS — Z0000 Encounter for general adult medical examination without abnormal findings: Secondary | ICD-10-CM | POA: Diagnosis not present

## 2023-10-09 NOTE — Progress Notes (Signed)
 Subjective:   Carrie Pratt is a 89 y.o. female who presents for Medicare Annual (Subsequent) preventive examination.  Visit Complete: In person [facility]   Cardiac Risk Factors include: advanced age (>69men, >18 women);sedentary lifestyle;diabetes mellitus;hypertension;dyslipidemia     Objective:    Today's Vitals   10/09/23 1415 10/09/23 1428  BP: (!) 142/71 (!) 151/82  Pulse: 89   Resp: 16   Temp: 97.6 F (36.4 C)   SpO2: 94%   Weight: 126 lb 12.8 oz (57.5 kg)   Height: 5' 2 (1.575 m)    Body mass index is 23.19 kg/m.     09/17/2023   10:38 AM 05/23/2023   12:48 PM 03/11/2023   11:58 AM 01/03/2023   10:21 AM 11/21/2022    9:46 AM 09/26/2022    9:55 AM 09/24/2022   11:44 AM  Advanced Directives  Does Patient Have a Medical Advance Directive? No No No No No No No  Does patient want to make changes to medical advance directive?    No - Patient declined No - Patient declined No - Patient declined No - Patient declined  Would patient like information on creating a medical advance directive? No - Patient declined No - Patient declined No - Patient declined        Current Medications (verified) Outpatient Encounter Medications as of 10/09/2023  Medication Sig   acetaminophen  (TYLENOL ) 500 MG tablet Take 1,000 mg by mouth every 8 (eight) hours as needed.   cetirizine  (ZYRTEC ) 10 MG tablet Take 10 mg by mouth daily.   Insulin  Glargine (BASAGLAR KWIKPEN) 100 UNIT/ML Inject 10 Units into the skin at bedtime.   metFORMIN  (GLUCOPHAGE -XR) 500 MG 24 hr tablet Take 1,000 mg by mouth 2 (two) times daily with a meal. at   montelukast  (SINGULAIR ) 10 MG tablet Take 10 mg by mouth at bedtime.   rosuvastatin  (CRESTOR ) 10 MG tablet Take 10 mg by mouth daily.   Skin Protectants, Misc. (EUCERIN) cream Apply 1 Application topically daily.   Vitamin D, Ergocalciferol, (DRISDOL) 1.25 MG (50000 UNIT) CAPS capsule Take 50,000 Units by mouth every 7 (seven) days. Wednesday   triamcinolone cream  (KENALOG) 0.1 % Apply 1 Application topically 2 (two) times daily. (Patient not taking: Reported on 10/09/2023)   No facility-administered encounter medications on file as of 10/09/2023.    Allergies (verified) Erythromycin   History: Past Medical History:  Diagnosis Date   Asthma    as a child   Bell's palsy    Diverticulosis    Environmental and seasonal allergies    Hyperlipidemia    Hypertension    IBS (irritable bowel syndrome)    Lower extremity edema    Macular degeneration, wet (HCC)    Migraines    Osteoporosis    TIA (transient ischemic attack)    Type 2 diabetes mellitus with polyneuropathy [facility])    Past Surgical History:  Procedure Laterality Date   CATARACT EXTRACTION W/PHACO Left 05/29/2016   Procedure: CATARACT EXTRACTION PHACO AND INTRAOCULAR LENS PLACEMENT (IOC);  Surgeon: Steven Dingeldein, MD;  Location: [facility] ORS;  Service: Ophthalmology;  Laterality: Left;  US : 01:48.8 AP% 25.2 CDE: 50.41 ONU#[id number] H   CHOLECYSTECTOMY     COLONOSCOPY     OOPHORECTOMY     TONSILLECTOMY     History reviewed. No pertinent family history. Social History   Socioeconomic History   Marital status: Widowed    Spouse name: Not on file   Number of children: 2   Years of education:  Not on file   Highest education level: Not on file  Occupational History   Occupation: Teacher--private school (all levels)    Comment: Retired  Tobacco Use   Smoking status: Former    Current packs/day: 0.00    Types: Cigarettes    Quit date: 03/04/1998    Years since quitting: 25.6   Smokeless tobacco: Never  Vaping Use   Vaping status: Never Used  Substance and Sexual Activity   Alcohol use: Yes    Comment: occasional   Drug use: No   Sexual activity: Not on file  Other Topics Concern   Not on file  Social History Narrative   Widowed 1977   2 sons      Not sure about living will   Sons should be health care POA   Would accept resuscitation   No tube feeds if cognitively unaware    Social Drivers of Corporate investment banker Strain: Not on file  Food Insecurity: Not on file  Transportation Needs: Not on file  Physical Activity: Not on file  Stress: Not on file  Social Connections: Not on file    Tobacco Counseling Counseling given: Not Answered   Clinical Intake:  Pre-visit preparation completed: Yes  Pain : No/denies pain     BMI - recorded: 23 Nutritional Status: BMI of 19-24  Normal Nutritional Risks: Other (Comment) (dementia) Diabetes: No  How often do you need to have someone help you when you read instructions, pamphlets, or other written materials from your doctor or pharmacy?: 5 - Always         Activities of Daily Living    10/09/2023    2:09 PM  In your present state of health, do you have any difficulty performing the following activities:  Hearing? 1  Vision? 0  Difficulty concentrating or making decisions? 1  Walking or climbing stairs? 1  Dressing or bathing? 1  Doing errands, shopping? 1  Preparing Food and eating ? Y  Comment feeds herself  Using the Toilet? Y  In the past six months, have you accidently leaked urine? Y  Do you have problems with loss of bowel control? N  Managing your Medications? Y  Managing your Finances? Y  Housekeeping or managing your Housekeeping? Y    Patient Care Team: Abdul Fine, MD as PCP - General (Family Medicine)  Indicate any recent Medical Services you may have received from other than [facility] providers in the past year (date may be approximate).     Assessment:   This is a routine wellness examination for [facility].  Hearing/Vision screen No results found.   Goals Addressed   None    Depression Screen    10/09/2023    2:15 PM  PHQ 2/9 Scores  Exception Documentation Other- indicate reason in comment box    Fall Risk    10/09/2023    2:15 PM 01/03/2023   10:32 PM 09/26/2022   10:48 AM  Fall Risk   Falls in the past year? 1 1 0  Number falls in past yr: 1 1 0   Injury with Fall? 0 0 0  Risk for fall due to : History of fall(s);Impaired balance/gait;Impaired mobility History of fall(s);Impaired balance/gait;Impaired mobility   Follow up Falls evaluation completed Falls evaluation completed;Falls prevention discussed;Education provided     MEDICARE RISK AT HOME: Medicare Risk at Home Any stairs in or around the home?: No Home free of loose throw rugs in walkways, pet beds, electrical cords, etc?:  Yes Adequate lighting in your home to reduce risk of falls?: Yes Life alert?: No Use of a cane, walker or w/c?: Yes Grab bars in the bathroom?: Yes Shower chair or bench in shower?: Yes Elevated toilet seat or a handicapped toilet?: Yes  TIMED UP AND GO:  Was the test performed?  No    Cognitive Function:        Immunizations Immunization History  Administered Date(s) Administered   Influenza Inj Mdck Quad Pf 12/15/2017   Influenza, High Dose Seasonal PF 12/18/2021   Influenza-Unspecified 12/03/2018, 12/18/2020, 12/25/2022   MODERNA COVID-19 SARS-COV-2 PEDS BIVALENT BOOSTER 55yr-39yr 07/31/2021   Moderna Covid-19 Vaccine Bivalent Booster 59yrs & up 07/31/2021, 01/11/2022, 11/29/2022   Moderna Sars-Covid-2 Vaccination 03/15/2019, 04/12/2019, 01/14/2020, 07/21/2020, 11/24/2020   Pneumococcal Conjugate-13 02/16/2015   Tdap 11/26/2013, 04/27/2019   Unspecified SARS-COV-2 Vaccination 06/13/2023   Zoster Recombinant(Shingrix) 09/30/2022    TDAP status: Up to date  Flu Vaccine status: Due, Education has been provided regarding the importance of this vaccine. Advised may receive this vaccine at local pharmacy or Health Dept. Aware to provide a copy of the vaccination record if obtained from local pharmacy or Health Dept. Verbalized acceptance and understanding.  Pneumococcal vaccine status: Up to date  Covid-19 vaccine status: Information provided on how to obtain vaccines.   Qualifies for Shingles Vaccine? Yes   Zostavax completed No    Shingles vaccine ordered- will clarify with pharmacy to see if she received both doses  Screening Tests Health Maintenance  Topic Date Due   Zoster Vaccines- Shingrix (2 of 2) 11/25/2022   INFLUENZA VACCINE  10/03/2023   Pneumococcal Vaccine: 50+ Years (2 of 2 - PPSV23, PCV20, or PCV21) 05/22/2024 (Originally 04/13/2015)   OPHTHALMOLOGY EXAM  11/21/2023   COVID-19 Vaccine (10 - Moderna risk 2024-25 season) 12/13/2023   HEMOGLOBIN A1C  01/03/2024   FOOT EXAM  10/08/2024   Medicare Annual Wellness (AWV)  10/08/2024   DTaP/Tdap/Td (3 - Td or Tdap) 04/26/2029   Hepatitis B Vaccines  Aged Out   HPV VACCINES  Aged Out   Meningococcal B Vaccine  Aged Out   DEXA SCAN  Discontinued    Health Maintenance  Health Maintenance Due  Topic Date Due   Zoster Vaccines- Shingrix (2 of 2) 11/25/2022   INFLUENZA VACCINE  10/03/2023    Colorectal cancer screening: No longer required.   Mammogram status: No longer required due to age.   Lung Cancer Screening: (Low Dose CT Chest recommended if Age 62-80 years, 20 pack-year currently smoking OR have quit w/in 15years.) does not qualify.    Additional Screening:  Hepatitis C Screening: does not qualify  Vision Screening: Recommended annual ophthalmology exams for early detection of glaucoma and other disorders of the eye. Is the patient up to date with their annual eye exam?  Yes  Who is the provider or what is the name of the office in which the patient attends annual eye exams? [facility] If pt is not established with a provider, would they like to be referred to a provider to establish care? No .   Dental Screening: Recommended annual dental exams for proper oral hygiene  Diabetic Foot Exam: Diabetic Foot Exam: Completed 08/27/2023  Community Resource Referral / Chronic Care Management: CRR required this visit?  No   CCM required this visit?  No     Plan:     I have personally reviewed and noted the following in the patient's  chart:   Medical and social history  Use of alcohol, tobacco or illicit drugs  Current medications and supplements including opioid prescriptions. Patient is not currently taking opioid prescriptions. Functional ability and status Nutritional status Physical activity Advanced directives List of other physicians Hospitalizations, surgeries, and ER visits in previous 12 months Vitals Screenings to include cognitive, depression, and falls Referrals and appointments  In addition, I have reviewed and discussed with patient certain preventive protocols, quality metrics, and best practice recommendations. A written personalized care plan for preventive services as well as general preventive health recommendations were provided to patient.     Harlene MARLA An, NP   10/09/2023

## 2023-10-30 ENCOUNTER — Encounter: Payer: Self-pay | Admitting: Nurse Practitioner

## 2023-10-30 ENCOUNTER — Non-Acute Institutional Stay (SKILLED_NURSING_FACILITY): Payer: Self-pay | Admitting: Nurse Practitioner

## 2023-10-30 DIAGNOSIS — J3089 Other allergic rhinitis: Secondary | ICD-10-CM

## 2023-10-30 DIAGNOSIS — E781 Pure hyperglyceridemia: Secondary | ICD-10-CM

## 2023-10-30 DIAGNOSIS — E1142 Type 2 diabetes mellitus with diabetic polyneuropathy: Secondary | ICD-10-CM

## 2023-10-30 DIAGNOSIS — E559 Vitamin D deficiency, unspecified: Secondary | ICD-10-CM

## 2023-10-30 DIAGNOSIS — Z794 Long term (current) use of insulin: Secondary | ICD-10-CM

## 2023-10-30 DIAGNOSIS — F01B0 Vascular dementia, moderate, without behavioral disturbance, psychotic disturbance, mood disturbance, and anxiety: Secondary | ICD-10-CM | POA: Diagnosis not present

## 2023-10-30 DIAGNOSIS — I10 Essential (primary) hypertension: Secondary | ICD-10-CM

## 2023-10-30 NOTE — Assessment & Plan Note (Signed)
  Continues glargine and metformin , A1c at goal Continue routine foot care and follow up with ophthalmology.

## 2023-10-30 NOTE — Assessment & Plan Note (Signed)
 Htn controlled off medication Continue to monitor

## 2023-10-30 NOTE — Progress Notes (Signed)
 Location:  Other [facility].  Nursing Home Room Number: [id number] [id number] Place of Service:  SNF [phone]) Harlene An, NP  PCP: Abdul Fine, MD  Patient Care Team: Abdul Fine, MD as PCP - General [facility])  Extended Emergency Contact Information Primary Emergency Contact: Bredeson,Geoff Address: [address]          [place], [place] [address] [place] Home Phone: [phone] Mobile Phone: [phone] Relation: Son Secondary Emergency Contact: [name] Address: [address]          [place], [place] [address] Home Phone: [phone] Mobile Phone: [phone] Relation: Son  Goals of care: Advanced Directive information    09/17/2023   10:38 AM  Advanced Directives  Does Patient Have a Medical Advance Directive? No  Would patient like information on creating a medical advance directive? No - Patient declined     Chief Complaint  Patient presents with   Medical Management of Chronic Issues    Medical Management of Chronic Issues.     HPI:  Pt is a 89 y.o. female seen today for medical management of chronic disease. Pt with hx of dementia, DM, hyperlipidemia, htn, allergies.  Pt reports she is doing well.  She has no complaints today Denies pain. Reports her appetite is fair- weight has been stable over the last month.  Denies shortness of breath, chest pains.  Staff has no concerns    Past Medical History:  Diagnosis Date   Asthma    as a child   Bell's palsy    Diverticulosis    Environmental and seasonal allergies    Hyperlipidemia    Hypertension    IBS (irritable bowel syndrome)    Lower extremity edema    Macular degeneration, wet (HCC)    Migraines    Osteoporosis    TIA (transient ischemic attack)    Type 2 diabetes mellitus with polyneuropathy [facility])    Past Surgical History:  Procedure Laterality Date   CATARACT EXTRACTION W/PHACO Left 05/29/2016   Procedure: CATARACT EXTRACTION PHACO AND  INTRAOCULAR LENS PLACEMENT (IOC);  Surgeon: Steven Dingeldein, MD;  Location: [facility] ORS;  Service: Ophthalmology;  Laterality: Left;  US : 01:48.8 AP% 25.2 CDE: 50.41 ONU#[id number] H   CHOLECYSTECTOMY     COLONOSCOPY     OOPHORECTOMY     TONSILLECTOMY      Allergies  Allergen Reactions   Erythromycin Diarrhea    Outpatient Encounter Medications as of 10/30/2023  Medication Sig   acetaminophen  (TYLENOL ) 500 MG tablet Take 1,000 mg by mouth every 8 (eight) hours as needed.   cetirizine  (ZYRTEC ) 10 MG tablet Take 10 mg by mouth daily.   Insulin  Glargine (BASAGLAR KWIKPEN) 100 UNIT/ML Inject 10 Units into the skin at bedtime.   metFORMIN  (GLUCOPHAGE -XR) 500 MG 24 hr tablet Take 1,000 mg by mouth 2 (two) times daily with a meal. at   montelukast  (SINGULAIR ) 10 MG tablet Take 10 mg by mouth at bedtime.   rosuvastatin  (CRESTOR ) 10 MG tablet Take 10 mg by mouth daily.   Skin Protectants, Misc. (EUCERIN) cream Apply 1 Application topically daily.   Vitamin D, Ergocalciferol, (DRISDOL) 1.25 MG (50000 UNIT) CAPS capsule Take 50,000 Units by mouth every 7 (seven) days. Wednesday   triamcinolone cream (KENALOG) 0.1 % Apply 1 Application topically 2 (two) times daily. (Patient not taking: Reported on 10/30/2023)   No facility-administered encounter medications on file as of 10/30/2023.    Review of Systems  Constitutional:  Negative for activity change, appetite  change, fatigue and unexpected weight change.  HENT:  Negative for congestion and hearing loss.   Eyes: Negative.   Respiratory:  Negative for cough and shortness of breath.   Cardiovascular:  Negative for chest pain, palpitations and leg swelling.  Gastrointestinal:  Negative for abdominal pain, constipation and diarrhea.  Genitourinary:  Negative for difficulty urinating and dysuria.  Musculoskeletal:  Negative for arthralgias and myalgias.  Skin:  Negative for color change and wound.  Neurological:  Negative for dizziness and weakness.   Psychiatric/Behavioral:  Negative for agitation and behavioral problems.      Immunization History  Administered Date(s) Administered   INFLUENZA, HIGH DOSE SEASONAL PF 12/18/2021   Influenza Inj Mdck Quad Pf 12/15/2017   Influenza-Unspecified 12/03/2018, 12/18/2020, 12/25/2022   MODERNA COVID-19 SARS-COV-2 PEDS BIVALENT BOOSTER 33yr-25yr 07/31/2021   Moderna Covid-19 Vaccine Bivalent Booster 34yrs & up 07/31/2021, 01/11/2022, 11/29/2022   Moderna Sars-Covid-2 Vaccination 03/15/2019, 04/12/2019, 01/14/2020, 07/21/2020, 11/24/2020   Pneumococcal Conjugate-13 02/16/2015   Tdap 11/26/2013, 04/27/2019   Unspecified SARS-COV-2 Vaccination 06/13/2023   Zoster Recombinant(Shingrix) 09/30/2022   Pertinent  Health Maintenance Due  Topic Date Due   INFLUENZA VACCINE  10/03/2023   OPHTHALMOLOGY EXAM  11/21/2023   HEMOGLOBIN A1C  01/03/2024   FOOT EXAM  10/08/2024   DEXA SCAN  Discontinued      07/22/2019    5:24 AM 08/13/2019    7:08 PM 09/26/2022   10:48 AM 01/03/2023   10:32 PM 10/09/2023    2:15 PM  Fall Risk  Falls in the past year?   0 1 1  Was there an injury with Fall?   0 0 0  Fall Risk Category Calculator   0 2 2  (RETIRED) Patient Fall Risk Level High fall risk  High fall risk      Patient at Risk for Falls Due to    History of fall(s);Impaired balance/gait;Impaired mobility History of fall(s);Impaired balance/gait;Impaired mobility  Fall risk Follow up    Falls evaluation completed;Falls prevention discussed;Education provided Falls evaluation completed     Data saved with a previous flowsheet row definition   Functional Status Survey:    Vitals:   10/30/23 1153  BP: 125/66  Pulse: 90  Resp: 16  Temp: 97.9 F (36.6 C)  SpO2: 90%  Weight: 126 lb 12.8 oz (57.5 kg)  Height: 5' 2 (1.575 m)   Body mass index is 23.19 kg/m. Physical Exam Constitutional:      General: She is not in acute distress.    Appearance: She is well-developed. She is not diaphoretic.  HENT:      Head: Normocephalic and atraumatic.     Mouth/Throat:     Pharynx: No oropharyngeal exudate.  Eyes:     Conjunctiva/sclera: Conjunctivae normal.     Pupils: Pupils are equal, round, and reactive to light.  Cardiovascular:     Rate and Rhythm: Normal rate and regular rhythm.     Heart sounds: Normal heart sounds.  Pulmonary:     Effort: Pulmonary effort is normal.     Breath sounds: Normal breath sounds.  Abdominal:     General: Bowel sounds are normal.     Palpations: Abdomen is soft.  Musculoskeletal:     Cervical back: Normal range of motion and neck supple.     Right lower leg: No edema.     Left lower leg: No edema.  Skin:    General: Skin is warm and dry.  Neurological:     Mental Status: She  is alert.  Psychiatric:        Mood and Affect: Mood normal.     Labs reviewed: Recent Labs    04/07/23 0000 07/03/23 0000 10/06/23 0000  NA 138 140 139  K 4.2 4.0 4.5  CL 104 101 101  CO2 29* 28* 28*  BUN 13 10 9   CREATININE 0.8 0.8 0.7  CALCIUM  9.0 9.4 9.3   Recent Labs    01/08/23 0000  AST 15  ALT 9  ALKPHOS 84  ALBUMIN 3.7   Recent Labs    04/07/23 0000 07/03/23 0000 10/06/23 0000  WBC 7.6 7.2 9.4  NEUTROABS 5,252.00 4,882.00 6,759.00  HGB 12.2 11.8* 11.8*  HCT 39 38 38  PLT 192 153 154   No results found for: TSH Lab Results  Component Value Date   HGBA1C 6.4 07/03/2023   Lab Results  Component Value Date   CHOL 109 07/03/2023   HDL 42 07/03/2023   LDLCALC 46 07/03/2023   TRIG 133 07/03/2023    Significant Diagnostic Results in last 30 days:  No results found.  Assessment/Plan Environmental and seasonal allergies Stable on zyrtec  and Singulair    Hyperlipidemia LDL at goal,  Continues on crestor , monitor lipids yearly    Hypertension Htn controlled off medication Continue to monitor  Type 2 diabetes mellitus with diabetic polyneuropathy, with long-term current use of insulin  (HCC)  Continues glargine and metformin , A1c  at goal Continue routine foot care and follow up with ophthalmology.   Vitamin D deficiency Continues on supplement     Monserrat Vidaurri K. Caro BODILY [facility] [phone]

## 2023-10-30 NOTE — Assessment & Plan Note (Signed)
 Stable on zyrtec  and Singulair 

## 2023-10-30 NOTE — Assessment & Plan Note (Signed)
 LDL at goal,  Continues on crestor , monitor lipids yearly

## 2023-10-30 NOTE — Assessment & Plan Note (Signed)
Continues on supplement 

## 2023-12-25 ENCOUNTER — Encounter: Payer: Self-pay | Admitting: Nurse Practitioner

## 2023-12-25 ENCOUNTER — Non-Acute Institutional Stay (SKILLED_NURSING_FACILITY): Payer: Self-pay | Admitting: Nurse Practitioner

## 2023-12-25 DIAGNOSIS — F01B0 Vascular dementia, moderate, without behavioral disturbance, psychotic disturbance, mood disturbance, and anxiety: Secondary | ICD-10-CM

## 2023-12-25 DIAGNOSIS — E781 Pure hyperglyceridemia: Secondary | ICD-10-CM

## 2023-12-25 DIAGNOSIS — E559 Vitamin D deficiency, unspecified: Secondary | ICD-10-CM

## 2023-12-25 DIAGNOSIS — I10 Essential (primary) hypertension: Secondary | ICD-10-CM

## 2023-12-25 DIAGNOSIS — E1142 Type 2 diabetes mellitus with diabetic polyneuropathy: Secondary | ICD-10-CM | POA: Diagnosis not present

## 2023-12-25 DIAGNOSIS — J3089 Other allergic rhinitis: Secondary | ICD-10-CM

## 2023-12-25 DIAGNOSIS — Z794 Long term (current) use of insulin: Secondary | ICD-10-CM

## 2023-12-25 NOTE — Assessment & Plan Note (Signed)
 Htn controlled off medication Continue to monitor

## 2023-12-25 NOTE — Assessment & Plan Note (Signed)
 Stable, no acute changes in cognitive or functional status, continue supportive care.

## 2023-12-25 NOTE — Progress Notes (Signed)
 Location[facility]  [facility]  Nursing Home Room Number: [facility] [id number] Place of Service:  SNF [phone]) Harlene An, NP  PCP: Laurence Locus, DO  Patient Care Team: Laurence Locus, DO as PCP - General (Internal Medicine)  Extended Emergency Contact Information Primary Emergency Contact: Capurro,Geoff Address: [address]          [place], [place] [address] [place] Home Phone: [phone] Mobile Phone: [phone] Relation: Son Secondary Emergency Contact: [name] Address: [address]          [place], [place] [address] Home Phone: [phone] Mobile Phone: [phone] Relation: Son  Goals of care: Advanced Directive information    12/25/2023    8:49 AM  Advanced Directives  Does Patient Have a Medical Advance Directive? No  Would patient like information on creating a medical advance directive? No - Patient declined     Chief Complaint  Patient presents with   Medical Management of Chronic Issues    Medical Management of Chronic Issues.     HPI:  Pt is a 89 y.o. female seen today for medical management of chronic disease. Pt with hx of dementia, DM, hyperlipidemia, htn, allergies, vit d def.  She reports she is doing well. States she is ready for her nap.  Reports she sleep well at night. No anxiety or depression.  Attends events around facility.  She is getting weaker per staff and more cautious with transfers.  Weekly weights and Glucerna added due to weight loss.  Staff has no acute concerns.    Past Medical History:  Diagnosis Date   Asthma    as a child   Bell's palsy    Diverticulosis    Environmental and seasonal allergies    Hyperlipidemia    Hypertension    IBS (irritable bowel syndrome)    Lower extremity edema    Macular degeneration, wet (HCC)    Migraines    Osteoporosis    TIA (transient ischemic attack)    Type 2 diabetes mellitus with polyneuropathy [facility])    Past Surgical History:  Procedure Laterality  Date   CATARACT EXTRACTION W/PHACO Left 05/29/2016   Procedure: CATARACT EXTRACTION PHACO AND INTRAOCULAR LENS PLACEMENT (IOC);  Surgeon: Steven Dingeldein, MD;  Location: [facility] ORS;  Service: Ophthalmology;  Laterality: Left;  US : 01:48.8 AP% 25.2 CDE: 50.41 ONU#[id number] H   CHOLECYSTECTOMY     COLONOSCOPY     OOPHORECTOMY     TONSILLECTOMY      Allergies  Allergen Reactions   Erythromycin Diarrhea    Outpatient Encounter Medications as of 12/25/2023  Medication Sig   cetirizine  (ZYRTEC ) 10 MG tablet Take 10 mg by mouth daily.   hydroxypropyl methylcellulose / hypromellose (ISOPTO TEARS / GONIOVISC) 2.5 % ophthalmic solution Place 1 drop into both eyes 2 (two) times daily.   Insulin  Glargine (BASAGLAR KWIKPEN) 100 UNIT/ML Inject 10 Units into the skin at bedtime.   metFORMIN  (GLUCOPHAGE -XR) 500 MG 24 hr tablet Take 1,000 mg by mouth 2 (two) times daily with a meal. at   montelukast  (SINGULAIR ) 10 MG tablet Take 10 mg by mouth at bedtime.   rosuvastatin  (CRESTOR ) 10 MG tablet Take 10 mg by mouth daily.   Skin Protectants, Misc. (EUCERIN) cream Apply 1 Application topically daily.   Sodium Fluoride (PREVIDENT 5000 BOOSTER PLUS) 1.1 % PSTE Place 1 application  onto teeth in the morning and at bedtime.   Vitamin D, Ergocalciferol, (DRISDOL) 1.25 MG (50000 UNIT) CAPS capsule Take 50,000 Units by mouth  every 7 (seven) days. Wednesday   acetaminophen  (TYLENOL ) 500 MG tablet Take 1,000 mg by mouth every 8 (eight) hours as needed.   No facility-administered encounter medications on file as of 12/25/2023.    Review of Systems  Constitutional:  Negative for activity change, appetite change, fatigue and unexpected weight change.  HENT:  Negative for congestion and hearing loss.   Eyes: Negative.   Respiratory:  Negative for cough and shortness of breath.   Cardiovascular:  Negative for chest pain, palpitations and leg swelling.  Gastrointestinal:  Negative for abdominal pain, constipation and  diarrhea.  Genitourinary:  Negative for difficulty urinating and dysuria.  Musculoskeletal:  Negative for arthralgias and myalgias.  Skin:  Negative for color change and wound.  Neurological:  Negative for dizziness and weakness.  Psychiatric/Behavioral:  Negative for agitation, behavioral problems and confusion.      Immunization History  Administered Date(s) Administered   INFLUENZA, HIGH DOSE SEASONAL PF 12/18/2021   Influenza Inj Mdck Quad Pf 12/15/2017   Influenza-Unspecified 12/03/2018, 12/18/2020, 12/25/2022, 12/16/2023   MODERNA COVID-19 SARS-COV-2 PEDS BIVALENT BOOSTER 98yr-61yr 07/31/2021   Moderna Covid-19 Vaccine Bivalent Booster 26yrs & up 07/31/2021, 01/11/2022, 11/29/2022   Moderna Sars-Covid-2 Vaccination 03/15/2019, 04/12/2019, 01/14/2020, 07/21/2020, 11/24/2020   Pneumococcal Conjugate-13 02/16/2015   Tdap 11/26/2013, 04/27/2019   Unspecified SARS-COV-2 Vaccination 06/13/2023   Zoster Recombinant(Shingrix) 09/30/2022   Pertinent  Health Maintenance Due  Topic Date Due   OPHTHALMOLOGY EXAM  11/21/2023   HEMOGLOBIN A1C  01/03/2024   FOOT EXAM  10/08/2024   Influenza Vaccine  Completed   DEXA SCAN  Discontinued      07/22/2019    5:24 AM 08/13/2019    7:08 PM 09/26/2022   10:48 AM 01/03/2023   10:32 PM 10/09/2023    2:15 PM  Fall Risk  Falls in the past year?   0 1 1  Was there an injury with Fall?   0 0 0  Fall Risk Category Calculator   0 2 2  (RETIRED) Patient Fall Risk Level High fall risk  High fall risk      Patient at Risk for Falls Due to    History of fall(s);Impaired balance/gait;Impaired mobility History of fall(s);Impaired balance/gait;Impaired mobility  Fall risk Follow up    Falls evaluation completed;Falls prevention discussed;Education provided Falls evaluation completed     Data saved with a previous flowsheet row definition   Functional Status Survey:    Vitals:   12/25/23 0843  BP: 135/72  Pulse: 82  Resp: 18  Temp: (!) 97.2 F (36.2  C)  SpO2: 94%  Weight: 119 lb 9.6 oz (54.3 kg)  Height: 5' 2 (1.575 m)   Body mass index is 21.88 kg/m. Physical Exam Constitutional:      General: She is not in acute distress.    Appearance: She is well-developed. She is not diaphoretic.  HENT:     Head: Normocephalic and atraumatic.     Mouth/Throat:     Pharynx: No oropharyngeal exudate.  Eyes:     Conjunctiva/sclera: Conjunctivae normal.     Pupils: Pupils are equal, round, and reactive to light.  Cardiovascular:     Rate and Rhythm: Normal rate and regular rhythm.     Heart sounds: Normal heart sounds.  Pulmonary:     Effort: Pulmonary effort is normal.     Breath sounds: Normal breath sounds.  Abdominal:     General: Bowel sounds are normal.     Palpations: Abdomen is soft.  Musculoskeletal:  Cervical back: Normal range of motion and neck supple.     Right lower leg: No edema.     Left lower leg: No edema.  Skin:    General: Skin is warm and dry.  Neurological:     Mental Status: She is alert.     Motor: Weakness present.     Gait: Gait abnormal.  Psychiatric:        Mood and Affect: Mood normal.     Labs reviewed: Recent Labs    04/07/23 0000 07/03/23 0000 10/06/23 0000  NA 138 140 139  K 4.2 4.0 4.5  CL 104 101 101  CO2 29* 28* 28*  BUN 13 10 9   CREATININE 0.8 0.8 0.7  CALCIUM  9.0 9.4 9.3   Recent Labs    01/08/23 0000  AST 15  ALT 9  ALKPHOS 84  ALBUMIN 3.7   Recent Labs    04/07/23 0000 07/03/23 0000 10/06/23 0000  WBC 7.6 7.2 9.4  NEUTROABS 5,252.00 4,882.00 6,759.00  HGB 12.2 11.8* 11.8*  HCT 39 38 38  PLT 192 153 154   No results found for: TSH Lab Results  Component Value Date   HGBA1C 6.4 07/03/2023   Lab Results  Component Value Date   CHOL 109 07/03/2023   HDL 42 07/03/2023   LDLCALC 46 07/03/2023   TRIG 133 07/03/2023    Significant Diagnostic Results in last 30 days:  No results found.  Assessment/Plan Vitamin D deficiency Continues on supplement,  will continue follow up Vit D level.   Type 2 diabetes mellitus with diabetic polyneuropathy, with long-term current use of insulin  (HCC) Glucose stable.  Continues glargine and metformin , A1c at goal Continue routine foot care and follow up with ophthalmology.   Moderate vascular dementia without behavioral disturbance, psychotic disturbance, mood disturbance, or anxiety (HCC) Stable, no acute changes in cognitive or functional status, continue supportive care.   Hypertension Htn controlled off medication Continue to monitor  Hyperlipidemia LDL at goal,  Continues on crestor , monitor lipids yearly    Environmental and seasonal allergies Stable on zyrtec  and Singulair     Torre Schaumburg K. Caro BODILY [facility] [phone]

## 2023-12-25 NOTE — Assessment & Plan Note (Signed)
 Glucose stable.  Continues glargine and metformin , A1c at goal Continue routine foot care and follow up with ophthalmology.

## 2023-12-25 NOTE — Assessment & Plan Note (Signed)
 Stable on zyrtec  and Singulair 

## 2023-12-25 NOTE — Assessment & Plan Note (Signed)
 LDL at goal,  Continues on crestor , monitor lipids yearly

## 2023-12-25 NOTE — Assessment & Plan Note (Signed)
 Continues on supplement, will continue follow up Vit D level.

## 2024-01-05 LAB — BASIC METABOLIC PANEL WITH GFR
BUN: 13 (ref 4–21)
CO2: 27 — AB (ref 13–22)
Chloride: 101 (ref 99–108)
Creatinine: 0.8 (ref 0.5–1.1)
Glucose: 85
Potassium: 4.3 meq/L (ref 3.5–5.1)
Sodium: 140 (ref 137–147)

## 2024-01-05 LAB — CBC AND DIFFERENTIAL
HCT: 39 (ref 36–46)
Hemoglobin: 12.2 (ref 12.0–16.0)
Platelets: 100 K/uL — AB (ref 150–400)
WBC: 9.9

## 2024-01-05 LAB — COMPREHENSIVE METABOLIC PANEL WITH GFR
Albumin: 3.9 (ref 3.5–5.0)
Calcium: 9.6 (ref 8.7–10.7)
Globulin: 2.7
eGFR: 69

## 2024-01-05 LAB — LIPID PANEL
Cholesterol: 92 (ref 0–200)
HDL: 33 — AB (ref 35–70)
LDL Cholesterol: 29
Triglycerides: 244 — AB (ref 40–160)

## 2024-01-05 LAB — HEMOGLOBIN A1C: Hemoglobin A1C: 6.6

## 2024-01-05 LAB — HEPATIC FUNCTION PANEL
ALT: 11 U/L (ref 7–35)
AST: 16 (ref 13–35)
Alkaline Phosphatase: 66 (ref 25–125)
Bilirubin, Total: 0.7

## 2024-01-05 LAB — VITAMIN D 25 HYDROXY (VIT D DEFICIENCY, FRACTURES): Vit D, 25-Hydroxy: 49

## 2024-01-05 LAB — CBC: RBC: 4.63 (ref 3.87–5.11)

## 2024-01-21 ENCOUNTER — Encounter: Payer: Self-pay | Admitting: Orthopedic Surgery

## 2024-01-21 ENCOUNTER — Non-Acute Institutional Stay: Payer: Self-pay | Admitting: Orthopedic Surgery

## 2024-01-21 NOTE — Progress Notes (Unsigned)
 Location:  [facility] Room Number: [facility] 409-A Place of Service:  SNF [phone]) Provider:  Greig Cluster, NP  Laurence Locus, DO  Patient Care Team: Laurence Locus, DO as PCP - General (Internal Medicine)  Extended Emergency Contact Information Primary Emergency Contact: Heitmeyer,Geoff Address: [address]          [place], [place] [address] [place] Home Phone: [phone] Mobile Phone: [phone] Relation: Son Secondary Emergency Contact: [name] Address: [address]          [place], [place] [address] Home Phone: [phone] Mobile Phone: [phone] Relation: Son  Code Status:  Full Code Goals of care: Advanced Directive information    01/21/2024    1:09 PM  Advanced Directives  Does Patient Have a Medical Advance Directive? No  Would patient like information on creating a medical advance directive? No - Patient declined     Chief Complaint  Patient presents with   Medical Management of Chronic Issues    HPI:  Pt is a 89 y.o. female seen today for medical management of chronic diseases.     Past Medical History:  Diagnosis Date   Asthma    as a child   Bell's palsy    Diverticulosis    Environmental and seasonal allergies    Hyperlipidemia    Hypertension    IBS (irritable bowel syndrome)    Lower extremity edema    Macular degeneration, wet (HCC)    Migraines    Osteoporosis    TIA (transient ischemic attack)    Type 2 diabetes mellitus with polyneuropathy [facility])    Past Surgical History:  Procedure Laterality Date   CATARACT EXTRACTION W/PHACO Left 05/29/2016   Procedure: CATARACT EXTRACTION PHACO AND INTRAOCULAR LENS PLACEMENT (IOC);  Surgeon: Steven Dingeldein, MD;  Location: [facility] ORS;  Service: Ophthalmology;  Laterality: Left;  US : 01:48.8 AP% 25.2 CDE: 50.41 ONU#[id number] H   CHOLECYSTECTOMY     COLONOSCOPY     OOPHORECTOMY     TONSILLECTOMY      Allergies  Allergen Reactions   Erythromycin Diarrhea     Outpatient Encounter Medications as of 01/21/2024  Medication Sig   acetaminophen  (TYLENOL ) 500 MG tablet Take 1,000 mg by mouth every 8 (eight) hours as needed.   cetirizine  (ZYRTEC ) 10 MG tablet Take 10 mg by mouth daily.   hydroxypropyl methylcellulose / hypromellose (ISOPTO TEARS / GONIOVISC) 2.5 % ophthalmic solution Place 1 drop into both eyes 2 (two) times daily.   Insulin  Glargine (BASAGLAR KWIKPEN) 100 UNIT/ML Inject 10 Units into the skin at bedtime.   metFORMIN  (GLUCOPHAGE -XR) 500 MG 24 hr tablet Take 1,000 mg by mouth 2 (two) times daily with a meal. at   montelukast  (SINGULAIR ) 10 MG tablet Take 10 mg by mouth at bedtime.   rosuvastatin  (CRESTOR ) 10 MG tablet Take 10 mg by mouth daily.   Skin Protectants, Misc. (EUCERIN) cream Apply 1 Application topically daily.   Sodium Fluoride (PREVIDENT 5000 BOOSTER PLUS) 1.1 % PSTE Place 1 application  onto teeth in the morning and at bedtime.   Vitamin D, Ergocalciferol, (DRISDOL) 1.25 MG (50000 UNIT) CAPS capsule Take 50,000 Units by mouth every 7 (seven) days. Wednesday   No facility-administered encounter medications on file as of 01/21/2024.    Review of Systems  Immunization History  Administered Date(s) Administered   INFLUENZA, HIGH DOSE SEASONAL PF 12/18/2021   Influenza Inj Mdck Quad Pf 12/15/2017   Influenza-Unspecified 12/03/2018, 12/18/2020, 12/25/2022, 12/16/2023   MODERNA COVID-19  SARS-COV-2 PEDS BIVALENT BOOSTER 68yr-54yr 07/31/2021   Moderna Covid-19 Vaccine Bivalent Booster 69yrs & up 07/31/2021, 01/11/2022, 11/29/2022   Moderna Sars-Covid-2 Vaccination 03/15/2019, 04/12/2019, 01/14/2020, 07/21/2020, 11/24/2020   Pneumococcal Conjugate-13 02/16/2015   Tdap 11/26/2013, 04/27/2019   Unspecified SARS-COV-2 Vaccination 06/13/2023   Zoster Recombinant(Shingrix) 09/30/2022   Pertinent  Health Maintenance Due  Topic Date Due   OPHTHALMOLOGY EXAM  11/21/2023   HEMOGLOBIN A1C  07/04/2024   FOOT EXAM  10/08/2024    Influenza Vaccine  Completed   Bone Density Scan  Discontinued      07/22/2019    5:24 AM 08/13/2019    7:08 PM 09/26/2022   10:48 AM 01/03/2023   10:32 PM 10/09/2023    2:15 PM  Fall Risk  Falls in the past year?   0 1 1  Was there an injury with Fall?   0 0 0  Fall Risk Category Calculator   0 2 2  (RETIRED) Patient Fall Risk Level High fall risk  High fall risk      Patient at Risk for Falls Due to    History of fall(s);Impaired balance/gait;Impaired mobility History of fall(s);Impaired balance/gait;Impaired mobility  Fall risk Follow up    Falls evaluation completed;Falls prevention discussed;Education provided Falls evaluation completed     Data saved with a previous flowsheet row definition   Functional Status Survey:    Vitals:   01/21/24 1307  BP: 120/60  Pulse: 84  Resp: 16  Temp: 98.2 F (36.8 C)  SpO2: 97%  Weight: 120 lb 12.8 oz (54.8 kg)  Height: 5' 2 (1.575 m)   Body mass index is 22.09 kg/m. Physical Exam  Labs reviewed: Recent Labs    07/03/23 0000 10/06/23 0000 01/05/24 0000  NA 140 139 140  K 4.0 4.5 4.3  CL 101 101 101  CO2 28* 28* 27*  BUN 10 9 13   CREATININE 0.8 0.7 0.8  CALCIUM  9.4 9.3 9.6   Recent Labs    01/05/24 0000  AST 16  ALT 11  ALKPHOS 66  ALBUMIN 3.9   Recent Labs    04/07/23 0000 07/03/23 0000 10/06/23 0000 01/05/24 0000  WBC 7.6 7.2 9.4 9.9  NEUTROABS 5,252.00 4,882.00 6,759.00  --   HGB 12.2 11.8* 11.8* 12.2  HCT 39 38 38 39  PLT 192 153 154 100*   No results found for: TSH Lab Results  Component Value Date   HGBA1C 6.6 01/05/2024   Lab Results  Component Value Date   CHOL 92 01/05/2024   HDL 33 (A) 01/05/2024   LDLCALC 29 01/05/2024   TRIG 244 (A) 01/05/2024    Significant Diagnostic Results in last 30 days:  No results found.  Assessment/Plan There are no diagnoses linked to this encounter.   Family/ staff Communication: ***  Labs/tests ordered:  ***

## 2024-01-22 ENCOUNTER — Encounter: Payer: Self-pay | Admitting: Nurse Practitioner

## 2024-01-22 ENCOUNTER — Non-Acute Institutional Stay (SKILLED_NURSING_FACILITY): Payer: Self-pay | Admitting: Nurse Practitioner

## 2024-01-22 DIAGNOSIS — I10 Essential (primary) hypertension: Secondary | ICD-10-CM

## 2024-01-22 DIAGNOSIS — F01B0 Vascular dementia, moderate, without behavioral disturbance, psychotic disturbance, mood disturbance, and anxiety: Secondary | ICD-10-CM | POA: Diagnosis not present

## 2024-01-22 DIAGNOSIS — E1142 Type 2 diabetes mellitus with diabetic polyneuropathy: Secondary | ICD-10-CM | POA: Diagnosis not present

## 2024-01-22 DIAGNOSIS — E559 Vitamin D deficiency, unspecified: Secondary | ICD-10-CM

## 2024-01-22 DIAGNOSIS — E781 Pure hyperglyceridemia: Secondary | ICD-10-CM

## 2024-01-22 DIAGNOSIS — Z794 Long term (current) use of insulin: Secondary | ICD-10-CM

## 2024-01-22 DIAGNOSIS — J3089 Other allergic rhinitis: Secondary | ICD-10-CM

## 2024-01-22 NOTE — Assessment & Plan Note (Signed)
 Well controlled, Will dc singulair  and monitor for symptoms

## 2024-01-22 NOTE — Progress Notes (Signed)
 This encounter was created in error - please disregard.

## 2024-01-22 NOTE — Assessment & Plan Note (Signed)
 Glucose stable.  On glargine and metformin , A1c at goal at 6.6 Will decrease metformin  to 500 mg daily since she has great glycemic control.  Continue routine foot care and follow up with ophthalmology.

## 2024-01-22 NOTE — Assessment & Plan Note (Signed)
 Stable, no acute changes in cognitive or functional status, continue supportive care.

## 2024-01-22 NOTE — Assessment & Plan Note (Signed)
 Vit D level 49  Continue supplement

## 2024-01-22 NOTE — Progress Notes (Addendum)
 Location:  [facility].  Nursing Home Room Number: [facility] [id number] Place of Service:  SNF [phone]) Harlene An, NP  PCP: Laurence Locus, DO  Patient Care Team: Laurence Locus, DO as PCP - General (Internal Medicine)  Extended Emergency Contact Information Primary Emergency Contact: Sabella,Geoff Address: [address]          [place], [place] [address] [place] Home Phone: [phone] Mobile Phone: [phone] Relation: Son Secondary Emergency Contact: [name] Address: [address]          [place], [place] [address] Home Phone: [phone] Mobile Phone: [phone] Relation: Son  Goals of care: Advanced Directive information    01/21/2024    1:09 PM  Advanced Directives  Does Patient Have a Medical Advance Directive? No  Would patient like information on creating a medical advance directive? No - Patient declined     Chief Complaint  Patient presents with   Medical Management of Chronic Issues    Medical Management of Chronic Issues.     HPI:  Pt is a 89 y.o. female seen today for medical management of chronic disease. Pt with hx of DM, dementia, htn, hyperlipidemia, vit d def.  Staff reports she has been at baseline She complains about nausea but continues to eat without any vomiting, abdominal pain or diarrhea. BM regular. She has been eating well and weights stable.  No LE edema.  No shortness of breath or chest pains Denies cough or congestion.  She is on Singulair  ans zyrtec  for allergies.   Past Medical History:  Diagnosis Date   Asthma    as a child   Bell's palsy    Diverticulosis    Environmental and seasonal allergies    Hyperlipidemia    Hypertension    IBS (irritable bowel syndrome)    Lower extremity edema    Macular degeneration, wet (HCC)    Migraines    Osteoporosis    TIA (transient ischemic attack)    Type 2 diabetes mellitus with polyneuropathy [facility])    Past Surgical History:  Procedure Laterality  Date   CATARACT EXTRACTION W/PHACO Left 05/29/2016   Procedure: CATARACT EXTRACTION PHACO AND INTRAOCULAR LENS PLACEMENT (IOC);  Surgeon: Steven Dingeldein, MD;  Location: [facility] ORS;  Service: Ophthalmology;  Laterality: Left;  US : 01:48.8 AP% 25.2 CDE: 50.41 ONU#[id number] H   CHOLECYSTECTOMY     COLONOSCOPY     OOPHORECTOMY     TONSILLECTOMY      Allergies  Allergen Reactions   Erythromycin Diarrhea    Outpatient Encounter Medications as of 01/22/2024  Medication Sig   acetaminophen  (TYLENOL ) 500 MG tablet Take 1,000 mg by mouth every 8 (eight) hours as needed.   cetirizine  (ZYRTEC ) 10 MG tablet Take 10 mg by mouth daily.   hydroxypropyl methylcellulose / hypromellose (ISOPTO TEARS / GONIOVISC) 2.5 % ophthalmic solution Place 1 drop into both eyes 2 (two) times daily.   Insulin  Glargine (BASAGLAR KWIKPEN) 100 UNIT/ML Inject 10 Units into the skin at bedtime.   metFORMIN  (GLUCOPHAGE -XR) 500 MG 24 hr tablet Take 1,000 mg by mouth 2 (two) times daily with a meal. at   montelukast  (SINGULAIR ) 10 MG tablet Take 10 mg by mouth at bedtime.   rosuvastatin  (CRESTOR ) 10 MG tablet Take 10 mg by mouth daily.   Skin Protectants, Misc. (EUCERIN) cream Apply 1 Application topically daily.   Sodium Fluoride (PREVIDENT 5000 BOOSTER PLUS) 1.1 % PSTE Place 1 application  onto teeth in the morning and at bedtime.  Vitamin D, Ergocalciferol, (DRISDOL) 1.25 MG (50000 UNIT) CAPS capsule Take 50,000 Units by mouth every 7 (seven) days. Wednesday   No facility-administered encounter medications on file as of 01/22/2024.    Review of Systems  Constitutional:  Negative for chills and fever.  HENT:  Negative for tinnitus.   Respiratory:  Negative for cough and shortness of breath.   Cardiovascular:  Negative for chest pain, palpitations and leg swelling.  Gastrointestinal:  Positive for nausea. Negative for abdominal pain, constipation and diarrhea.  Genitourinary:  Negative for dysuria, frequency and  urgency.  Musculoskeletal:  Negative for back pain and myalgias.  Skin: Negative.   Neurological:  Positive for headaches. Negative for dizziness.     Immunization History  Administered Date(s) Administered   INFLUENZA, HIGH DOSE SEASONAL PF 12/18/2021   Influenza Inj Mdck Quad Pf 12/15/2017   Influenza-Unspecified 12/03/2018, 12/18/2020, 12/25/2022, 12/16/2023   MODERNA COVID-19 SARS-COV-2 PEDS BIVALENT BOOSTER 77yr-59yr 07/31/2021   Moderna Covid-19 Vaccine Bivalent Booster 25yrs & up 07/31/2021, 01/11/2022, 11/29/2022   Moderna Sars-Covid-2 Vaccination 03/15/2019, 04/12/2019, 01/14/2020, 07/21/2020, 11/24/2020   Pneumococcal Conjugate-13 02/16/2015   Tdap 11/26/2013, 04/27/2019   Unspecified SARS-COV-2 Vaccination 06/13/2023, 12/26/2023   Zoster Recombinant(Shingrix) 09/30/2022   Pertinent  Health Maintenance Due  Topic Date Due   OPHTHALMOLOGY EXAM  11/21/2023   HEMOGLOBIN A1C  07/04/2024   FOOT EXAM  10/08/2024   Influenza Vaccine  Completed   Bone Density Scan  Discontinued      07/22/2019    5:24 AM 08/13/2019    7:08 PM 09/26/2022   10:48 AM 01/03/2023   10:32 PM 10/09/2023    2:15 PM  Fall Risk  Falls in the past year?   0 1 1  Was there an injury with Fall?   0 0 0  Fall Risk Category Calculator   0 2 2  (RETIRED) Patient Fall Risk Level High fall risk  High fall risk      Patient at Risk for Falls Due to    History of fall(s);Impaired balance/gait;Impaired mobility History of fall(s);Impaired balance/gait;Impaired mobility  Fall risk Follow up    Falls evaluation completed;Falls prevention discussed;Education provided Falls evaluation completed     Data saved with a previous flowsheet row definition   Functional Status Survey:    Vitals:   01/22/24 0843  BP: 133/64  Pulse: 88  Resp: 16  Temp: 97.6 F (36.4 C)  SpO2: 96%  Weight: 121 lb 9.6 oz (55.2 kg)  Height: 5' 2 (1.575 m)   Body mass index is 22.24 kg/m. Wt Readings from Last 3 Encounters:   01/22/24 121 lb 9.6 oz (55.2 kg)  01/21/24 120 lb 12.8 oz (54.8 kg)  12/25/23 119 lb 9.6 oz (54.3 kg)    Physical Exam Constitutional:      General: She is not in acute distress.    Appearance: She is well-developed. She is not diaphoretic.  HENT:     Head: Normocephalic and atraumatic.     Mouth/Throat:     Pharynx: No oropharyngeal exudate.  Eyes:     Conjunctiva/sclera: Conjunctivae normal.     Pupils: Pupils are equal, round, and reactive to light.  Cardiovascular:     Rate and Rhythm: Normal rate and regular rhythm.     Heart sounds: Normal heart sounds.  Pulmonary:     Effort: Pulmonary effort is normal.     Breath sounds: Normal breath sounds.  Abdominal:     General: Bowel sounds are normal.  Palpations: Abdomen is soft.  Musculoskeletal:     Cervical back: Normal range of motion and neck supple.     Right lower leg: No edema.     Left lower leg: No edema.  Skin:    General: Skin is warm and dry.  Neurological:     Mental Status: She is alert. Mental status is at baseline.     Motor: Weakness present.     Gait: Gait abnormal.  Psychiatric:        Mood and Affect: Mood normal.     Labs reviewed: Recent Labs    07/03/23 0000 10/06/23 0000 01/05/24 0000  NA 140 139 140  K 4.0 4.5 4.3  CL 101 101 101  CO2 28* 28* 27*  BUN 10 9 13   CREATININE 0.8 0.7 0.8  CALCIUM  9.4 9.3 9.6   Recent Labs    01/05/24 0000  AST 16  ALT 11  ALKPHOS 66  ALBUMIN 3.9   Recent Labs    04/07/23 0000 07/03/23 0000 10/06/23 0000 01/05/24 0000  WBC 7.6 7.2 9.4 9.9  NEUTROABS 5,252.00 4,882.00 6,759.00  --   HGB 12.2 11.8* 11.8* 12.2  HCT 39 38 38 39  PLT 192 153 154 100*   No results found for: TSH Lab Results  Component Value Date   HGBA1C 6.6 01/05/2024   Lab Results  Component Value Date   CHOL 92 01/05/2024   HDL 33 (A) 01/05/2024   LDLCALC 29 01/05/2024   TRIG 244 (A) 01/05/2024    Significant Diagnostic Results in last 30 days:  No results  found.  Assessment/Plan Vitamin D deficiency Vit D level 49  Continue supplement   Type 2 diabetes mellitus with diabetic polyneuropathy, with long-term current use of insulin  (HCC) Glucose stable.  On glargine and metformin , A1c at goal at 6.6 Will decrease metformin  to 500 mg daily since she has great glycemic control.  Continue routine foot care and follow up with ophthalmology.   Moderate vascular dementia without behavioral disturbance, psychotic disturbance, mood disturbance, or anxiety (HCC) Stable, no acute changes in cognitive or functional status, continue supportive care.   Hypertension Htn controlled off medication Continue to monitor  Hyperlipidemia LDL 29- at goal Continues on crestor , monitor lipids yearly    Environmental and seasonal allergies Well controlled, Will dc singulair  and monitor for symptoms     Lynsee Wands K. Caro BODILY [facility] [phone]

## 2024-01-22 NOTE — Assessment & Plan Note (Signed)
 LDL 29- at goal Continues on crestor , monitor lipids yearly

## 2024-01-22 NOTE — Assessment & Plan Note (Signed)
 Htn controlled off medication Continue to monitor

## 2024-02-23 ENCOUNTER — Non-Acute Institutional Stay (SKILLED_NURSING_FACILITY): Payer: Self-pay | Admitting: Internal Medicine

## 2024-02-23 ENCOUNTER — Encounter: Payer: Self-pay | Admitting: Internal Medicine

## 2024-02-23 DIAGNOSIS — I10 Essential (primary) hypertension: Secondary | ICD-10-CM | POA: Diagnosis not present

## 2024-02-23 DIAGNOSIS — F01B0 Vascular dementia, moderate, without behavioral disturbance, psychotic disturbance, mood disturbance, and anxiety: Secondary | ICD-10-CM

## 2024-02-23 DIAGNOSIS — Z794 Long term (current) use of insulin: Secondary | ICD-10-CM | POA: Diagnosis not present

## 2024-02-23 DIAGNOSIS — E1142 Type 2 diabetes mellitus with diabetic polyneuropathy: Secondary | ICD-10-CM

## 2024-02-23 DIAGNOSIS — E559 Vitamin D deficiency, unspecified: Secondary | ICD-10-CM | POA: Diagnosis not present

## 2024-02-23 DIAGNOSIS — E782 Mixed hyperlipidemia: Secondary | ICD-10-CM | POA: Diagnosis not present

## 2024-02-23 NOTE — Assessment & Plan Note (Signed)
 Stable blood pressure.  She is not on any antihypertensives.

## 2024-02-23 NOTE — Assessment & Plan Note (Signed)
 She remains on Basaglar 10 units subcu at bedtime.,  Metformin  500 mg once a day.  Last A1c Of 6.6% January 05, 2024 indicating good outpatient control of her diabetes.

## 2024-02-23 NOTE — Assessment & Plan Note (Signed)
 Stable dementia.  She is not on any antidepressants, medications for agitation or medications for her dementia.

## 2024-02-23 NOTE — Progress Notes (Signed)
 [facility] Routine Visit Progress Note    Location:  Other [place].  Nursing Home Room Number: [facility] [id number] Place of Service:  SNF (31)   PCP: Laurence Locus, DO   Patient Care Team: Laurence Locus, DO as PCP - General (Internal Medicine)   Extended Emergency Contact Information Primary Emergency Contact: Mcgilvery,Geoff Address: [address]          [place], [place] [address] [place] Home Phone: [phone] Mobile Phone: [phone] Relation: Son Secondary Emergency Contact: [name] Address: [address]          [place], [place] [address] Home Phone: [phone] Mobile Phone: [phone] Relation: Son   Goals of care: Advanced Directive information    01/21/2024    1:09 PM  Advanced Directives  Does Patient Have a Medical Advance Directive? No  Would patient like information on creating a medical advance directive? No - Patient declined    CODE STATUS: Full Code   Chief Complaint  Patient presents with   Medical Management of Chronic Issues    Medical Management of Chronic Issues.      HPI: Pt is a 89 y.o. female seen today for medical management of chronic disease.   Ms. Odette is a 89 year old female with a history of type 2 diabetes, diabetic, polyneuropathy, essential hypertension, mixed hyperlipidemia, moderate vascular dementia without behavioral disturbance who was seen for routine medical care.  She has no complaints.  She is unclear whether or not her family is going to come see her for Christmas.  She cannot remember whether or not some of the pictures in her room are of her grandchildren and not.  Nursing without any concerns.    Past Medical History:  Diagnosis Date   Asthma    as a child   Bell's palsy    Diverticulosis    Environmental and seasonal allergies    Hyperlipidemia    Hypertension    IBS (irritable bowel syndrome)    Lower extremity edema    Macular degeneration, wet (HCC)    Malignant neoplasm of  colon, unspecified part of colon (HCC) 09/23/2022   Migraines    Osteoporosis    TIA (transient ischemic attack)    Type 2 diabetes mellitus with polyneuropathy [facility])    Past Surgical History:  Procedure Laterality Date   CATARACT EXTRACTION W/PHACO Left 05/29/2016   Procedure: CATARACT EXTRACTION PHACO AND INTRAOCULAR LENS PLACEMENT (IOC);  Surgeon: Steven Dingeldein, MD;  Location: [facility] ORS;  Service: Ophthalmology;  Laterality: Left;  US : 01:48.8 AP% 25.2 CDE: 50.41 ONU#[id number] H   CHOLECYSTECTOMY     COLONOSCOPY     OOPHORECTOMY     TONSILLECTOMY       Allergies[1]   Outpatient Encounter Medications as of 02/23/2024  Medication Sig   acetaminophen  (TYLENOL ) 500 MG tablet Take 1,000 mg by mouth every 8 (eight) hours as needed.   cetirizine  (ZYRTEC ) 10 MG tablet Take 10 mg by mouth daily.   hydroxypropyl methylcellulose / hypromellose (ISOPTO TEARS / GONIOVISC) 2.5 % ophthalmic solution Place 1 drop into both eyes 2 (two) times daily.   Insulin  Glargine (BASAGLAR KWIKPEN) 100 UNIT/ML Inject 10 Units into the skin at bedtime.   metFORMIN  (GLUCOPHAGE -XR) 500 MG 24 hr tablet Take 500 mg by mouth daily with breakfast. at   ondansetron  (ZOFRAN ) 4 MG tablet Take 4 mg by mouth every 8 (eight) hours as needed for nausea or vomiting.   polyethylene glycol (MIRALAX  / GLYCOLAX ) 17 g packet Take  17 g by mouth daily.   rosuvastatin  (CRESTOR ) 10 MG tablet Take 10 mg by mouth daily.   Skin Protectants, Misc. (EUCERIN) cream Apply 1 Application topically daily.   Sodium Fluoride (PREVIDENT 5000 BOOSTER PLUS) 1.1 % PSTE Place 1 application  onto teeth in the morning and at bedtime.   Vitamin D, Ergocalciferol, (DRISDOL) 1.25 MG (50000 UNIT) CAPS capsule Take 50,000 Units by mouth every 7 (seven) days. Wednesday   montelukast  (SINGULAIR ) 10 MG tablet Take 10 mg by mouth at bedtime. (Patient not taking: Reported on 02/23/2024)   [DISCONTINUED] Cholecalciferol 1.25 MG (50000 UT) TABS Take 1 capsule by  mouth once a week.   No facility-administered encounter medications on file as of 02/23/2024.     Review of Systems  Constitutional: Negative.   HENT: Negative.    Eyes: Negative.   Respiratory: Negative.    Cardiovascular: Negative.   Gastrointestinal: Negative.   Endocrine: Negative.   Genitourinary: Negative.   Musculoskeletal: Negative.   Skin: Negative.   Allergic/Immunologic: Negative.   Neurological: Negative.   Hematological: Negative.   Psychiatric/Behavioral: Negative.    All other systems reviewed and are negative.     Immunization History  Administered Date(s) Administered   INFLUENZA, HIGH DOSE SEASONAL PF 12/18/2021   Influenza Inj Mdck Quad Pf 12/15/2017   Influenza-Unspecified 12/03/2018, 12/18/2020, 12/25/2022, 12/16/2023   MODERNA COVID-19 SARS-COV-2 PEDS BIVALENT BOOSTER 91yr-62yr 07/31/2021   Moderna Covid-19 Vaccine Bivalent Booster 20yrs & up 07/31/2021, 01/11/2022, 11/29/2022   Moderna Sars-Covid-2 Vaccination 03/15/2019, 04/12/2019, 01/14/2020, 07/21/2020, 11/24/2020   Pneumococcal Conjugate-13 02/16/2015   Tdap 11/26/2013, 04/27/2019   Unspecified SARS-COV-2 Vaccination 06/13/2023, 12/26/2023   Zoster Recombinant(Shingrix) 09/30/2022   Pertinent  Health Maintenance Due  Topic Date Due   OPHTHALMOLOGY EXAM  11/21/2023   HEMOGLOBIN A1C  07/04/2024   FOOT EXAM  10/08/2024   Influenza Vaccine  Completed   Bone Density Scan  Discontinued      07/22/2019    5:24 AM 08/13/2019    7:08 PM 09/26/2022   10:48 AM 01/03/2023   10:32 PM 10/09/2023    2:15 PM  Fall Risk  Falls in the past year?   0 1 1  Was there an injury with Fall?   0  0  0   Fall Risk Category Calculator   0 2 2  (RETIRED) Patient Fall Risk Level High fall risk  High fall risk      Patient at Risk for Falls Due to    History of fall(s);Impaired balance/gait;Impaired mobility History of fall(s);Impaired balance/gait;Impaired mobility  Fall risk Follow up    Falls evaluation  completed;Falls prevention discussed;Education provided Falls evaluation completed     Data saved with a previous flowsheet row definition   Functional Status Survey:     Vitals:   02/23/24 1425  BP: 121/62  Pulse: 92  Resp: 17  Temp: 98.1 F (36.7 C)  SpO2: 94%  Weight: 117 lb 12.8 oz (53.4 kg)  Height: 5' 2 (1.575 m)   Body mass index is 21.55 kg/m. Physical Exam Vitals and nursing note reviewed.  Constitutional:      General: She is not in acute distress.    Appearance: She is not toxic-appearing.  HENT:     Head: Normocephalic and atraumatic.     Nose: Nose normal.  Eyes:     General: No scleral icterus. Cardiovascular:     Rate and Rhythm: Normal rate and regular rhythm.  Pulmonary:     Effort: Pulmonary effort  is normal.     Breath sounds: Normal breath sounds.  Abdominal:     General: Bowel sounds are normal.     Palpations: Abdomen is soft.  Skin:    General: Skin is warm and dry.     Capillary Refill: Capillary refill takes less than 2 seconds.  Neurological:     General: No focal deficit present.     Mental Status: She is alert.     Comments: Does not recall if the pictures on her dresser are of her grandson or not.      Labs reviewed: Recent Labs    07/03/23 0000 10/06/23 0000 01/05/24 0000  NA 140 139 140  K 4.0 4.5 4.3  CL 101 101 101  CO2 28* 28* 27*  BUN 10 9 13   CREATININE 0.8 0.7 0.8  CALCIUM  9.4 9.3 9.6   Recent Labs    01/05/24 0000  AST 16  ALT 11  ALKPHOS 66  ALBUMIN 3.9   Recent Labs    04/07/23 0000 07/03/23 0000 10/06/23 0000 01/05/24 0000  WBC 7.6 7.2 9.4 9.9  NEUTROABS 5,252.00 4,882.00 6,759.00  --   HGB 12.2 11.8* 11.8* 12.2  HCT 39 38 38 39  PLT 192 153 154 100*    Lab Results  Component Value Date   HGBA1C 6.6 01/05/2024   Lab Results  Component Value Date   CHOL 92 01/05/2024   HDL 33 (A) 01/05/2024   LDLCALC 29 01/05/2024   TRIG 244 (A) 01/05/2024      Assessment/Plan Moderate vascular  dementia without behavioral disturbance, psychotic disturbance, mood disturbance, or anxiety (HCC) Stable dementia.  She is not on any antidepressants, medications for agitation or medications for her dementia.  Type 2 diabetes mellitus with diabetic polyneuropathy, with long-term current use of insulin  (HCC) She remains on Basaglar 10 units subcu at bedtime.,  Metformin  500 mg once a day.  Last A1c Of 6.6% January 05, 2024 indicating good outpatient control of her diabetes.  Essential hypertension Stable blood pressure.  She is not on any antihypertensives.  Mixed hyperlipidemia Continue with Crestor  10 mg daily.  Lipid Panel: Lab Results  Component Value Date/Time   CHOL 92 01/05/2024 12:00 AM   TRIG 244 (A) 01/05/2024 12:00 AM   HDL 33 (A) 01/05/2024 12:00 AM   LDLCALC 29 01/05/2024 12:00 AM     Vitamin D deficiency Continue with cholecalciferol tablets 50,000 international units every Wednesday.  Lab Results  Component Value Date/Time   VD25OH 49 01/05/2024 12:00 AM      No orders of the defined types were placed in this encounter.  Medications Discontinued During This Encounter  Medication Reason   Cholecalciferol 1.25 MG (50000 UT) TABS Duplicate   Orders Placed This Encounter  Procedures   VITAMIN D 25 Hydroxy (Vit-D Deficiency, Fractures)    This external order was created through the Results Console.    Camellia Door, DO  [facility] [phone]      [1]  Allergies Allergen Reactions   Erythromycin Diarrhea

## 2024-02-23 NOTE — Assessment & Plan Note (Addendum)
 Continue with cholecalciferol tablets 50,000 international units every Wednesday.  Lab Results  Component Value Date/Time   VD25OH 49 01/05/2024 12:00 AM

## 2024-02-23 NOTE — Assessment & Plan Note (Addendum)
 Continue with Crestor  10 mg daily.  Lipid Panel: Lab Results  Component Value Date/Time   CHOL 92 01/05/2024 12:00 AM   TRIG 244 (A) 01/05/2024 12:00 AM   HDL 33 (A) 01/05/2024 12:00 AM   LDLCALC 29 01/05/2024 12:00 AM

## 2024-03-22 ENCOUNTER — Encounter: Payer: Self-pay | Admitting: Orthopedic Surgery

## 2024-03-22 ENCOUNTER — Non-Acute Institutional Stay (SKILLED_NURSING_FACILITY): Payer: Self-pay | Admitting: Orthopedic Surgery

## 2024-03-22 DIAGNOSIS — E559 Vitamin D deficiency, unspecified: Secondary | ICD-10-CM | POA: Diagnosis not present

## 2024-03-22 DIAGNOSIS — E1142 Type 2 diabetes mellitus with diabetic polyneuropathy: Secondary | ICD-10-CM | POA: Diagnosis not present

## 2024-03-22 DIAGNOSIS — F01B0 Vascular dementia, moderate, without behavioral disturbance, psychotic disturbance, mood disturbance, and anxiety: Secondary | ICD-10-CM | POA: Diagnosis not present

## 2024-03-22 DIAGNOSIS — Z7984 Long term (current) use of oral hypoglycemic drugs: Secondary | ICD-10-CM | POA: Diagnosis not present

## 2024-03-22 DIAGNOSIS — E782 Mixed hyperlipidemia: Secondary | ICD-10-CM | POA: Diagnosis not present

## 2024-03-22 DIAGNOSIS — I10 Essential (primary) hypertension: Secondary | ICD-10-CM

## 2024-03-22 NOTE — Progress Notes (Unsigned)
 " Location:  Other [place].  Nursing Home Room Number: [facility] [id number] Place of Service:  SNF [phone]) Provider:  Greig Cluster, NP  PCP: Laurence Locus, DO  Patient Care Team: Laurence Locus, DO as PCP - General (Internal Medicine)  Extended Emergency Contact Information Primary Emergency Contact: Nielson,Geoff Address: [address]          [place], [place] [address] [place] Home Phone: [phone] Mobile Phone: [phone] Relation: Son Secondary Emergency Contact: [name] Address: [address]          [place], [place] [address] Home Phone: [phone] Mobile Phone: [phone] Relation: Son  Code Status:  DNR Goals of care: Advanced Directive information    03/22/2024   11:43 AM  Advanced Directives  Does Patient Have a Medical Advance Directive? Yes  Type of Advance Directive Out of facility DNR (pink MOST or yellow form)  Does patient want to make changes to medical advance directive? No - Patient declined     Chief Complaint  Patient presents with   Medical Management of Chronic Issues    Medical Management of Chronic Issues.     HPI:  Pt is a 89 y.o. female seen today for medical management of chronic diseases.    She currently resides on the skilled nursing unit at [facility]. PMH: atherosclerosis, HTN, asthma, T2DM, dementia, SDH, h/o CVA, osteoporosis, OAB and vitamin D deficiency.   T2DM- A1c 6.6 01/05/2024, sugars 200-300's, no hypoglycemias, followed by dietary> recommend low sugar/carb snacks, remains on metformin  and insulin  glargine  HTN- BUN/creat 13/0.8 01/05/2024, controlled without medication  HLD- LDL 29 01/05/2024, remains on rosuvastatin  Dementia- BIMS score 11/15 03/11/2023, no behaviors, dependent with ADLs except feeding, not on medication  Vitamin D deficiency- vitamin D level 49 01/05/2024, remains on vitamin D supplement  Very pleasant, No concerns today. 01/15 code status changes to DNR. Attends activities offered.  No recent falls or injuries. Ambulates with wheelchair.   Recent weights:  01/01- 116 lbs  12/03- 117 lbs  11/05- 117.2 lbs  Recent blood pressures:  01/14- 137/70  01/07- 129/65  12/31- 141/64   Past Medical History:  Diagnosis Date   Asthma    as a child   Bell's palsy    Diverticulosis    Environmental and seasonal allergies    Hyperlipidemia    Hypertension    IBS (irritable bowel syndrome)    Lower extremity edema    Macular degeneration, wet (HCC)    Malignant neoplasm of colon, unspecified part of colon (HCC) 09/23/2022   Migraines    Osteoporosis    TIA (transient ischemic attack)    Type 2 diabetes mellitus with polyneuropathy [facility])    Past Surgical History:  Procedure Laterality Date   CATARACT EXTRACTION W/PHACO Left 05/29/2016   Procedure: CATARACT EXTRACTION PHACO AND INTRAOCULAR LENS PLACEMENT (IOC);  Surgeon: Steven Dingeldein, MD;  Location: [facility] ORS;  Service: Ophthalmology;  Laterality: Left;  US : 01:48.8 AP% 25.2 CDE: 50.41 ONU#[id number] H   CHOLECYSTECTOMY     COLONOSCOPY     OOPHORECTOMY     TONSILLECTOMY      Allergies[1]  Outpatient Encounter Medications as of 03/22/2024  Medication Sig   acetaminophen  (TYLENOL ) 500 MG tablet Take 1,000 mg by mouth every 8 (eight) hours as needed.   cetirizine  (ZYRTEC ) 10 MG tablet Take 10 mg by mouth daily.   feeding supplement, GLUCERNA SHAKE, (GLUCERNA SHAKE) LIQD Take 237 mLs by mouth 2 (two) times daily between meals.  hydroxypropyl methylcellulose / hypromellose (ISOPTO TEARS / GONIOVISC) 2.5 % ophthalmic solution Place 1 drop into both eyes 2 (two) times daily.   Insulin  Glargine (BASAGLAR KWIKPEN) 100 UNIT/ML Inject 10 Units into the skin at bedtime.   metFORMIN  (GLUCOPHAGE -XR) 500 MG 24 hr tablet Take 500 mg by mouth daily with breakfast. at   polyethylene glycol (MIRALAX  / GLYCOLAX ) 17 g packet Take 17 g by mouth daily.   rosuvastatin  (CRESTOR ) 10 MG tablet Take 10 mg by mouth daily.   Skin  Protectants, Misc. (EUCERIN) cream Apply 1 Application topically daily.   Sodium Fluoride (PREVIDENT 5000 BOOSTER PLUS) 1.1 % PSTE Place 1 application  onto teeth in the morning and at bedtime.   Vitamin D, Ergocalciferol, (DRISDOL) 1.25 MG (50000 UNIT) CAPS capsule Take 50,000 Units by mouth every 7 (seven) days. Wednesday   montelukast  (SINGULAIR ) 10 MG tablet Take 10 mg by mouth at bedtime. (Patient not taking: Reported on 03/22/2024)   ondansetron  (ZOFRAN ) 4 MG tablet Take 4 mg by mouth every 8 (eight) hours as needed for nausea or vomiting. (Patient not taking: Reported on 03/22/2024)   No facility-administered encounter medications on file as of 03/22/2024.    Review of Systems  Constitutional:  Negative for fatigue, fever and unexpected weight change.  HENT:  Negative for sore throat and trouble swallowing.   Eyes:  Negative for visual disturbance.  Respiratory:  Negative for cough and shortness of breath.   Cardiovascular:  Negative for chest pain and leg swelling.  Gastrointestinal:  Negative for abdominal distention and abdominal pain.  Endocrine: Negative for polydipsia, polyphagia and polyuria.  Genitourinary:  Negative for dysuria, hematuria and vaginal bleeding.  Musculoskeletal:  Positive for gait problem.  Skin:  Negative for wound.  Neurological:  Positive for weakness. Negative for dizziness and headaches.  Psychiatric/Behavioral:  Positive for confusion. Negative for dysphoric mood and sleep disturbance. The patient is not nervous/anxious.     Immunization History  Administered Date(s) Administered   INFLUENZA, HIGH DOSE SEASONAL PF 12/18/2021   Influenza Inj Mdck Quad Pf 12/15/2017   Influenza-Unspecified 12/03/2018, 12/18/2020, 12/25/2022, 12/16/2023   MODERNA COVID-19 SARS-COV-2 PEDS BIVALENT BOOSTER 55yr-64yr 07/31/2021   Moderna Covid-19 Vaccine Bivalent Booster 81yrs & up 07/31/2021, 01/11/2022, 11/29/2022   Moderna Sars-Covid-2 Vaccination 03/15/2019, 04/12/2019,  01/14/2020, 07/21/2020, 11/24/2020   Pneumococcal Conjugate-13 02/16/2015   Tdap 11/26/2013, 04/27/2019   Unspecified SARS-COV-2 Vaccination 06/13/2023, 12/26/2023   Zoster Recombinant(Shingrix) 09/30/2022   Pertinent  Health Maintenance Due  Topic Date Due   OPHTHALMOLOGY EXAM  11/21/2023   HEMOGLOBIN A1C  07/04/2024   FOOT EXAM  10/08/2024   Influenza Vaccine  Completed   Bone Density Scan  Discontinued      07/22/2019    5:24 AM 08/13/2019    7:08 PM 09/26/2022   10:48 AM 01/03/2023   10:32 PM 10/09/2023    2:15 PM  Fall Risk  Falls in the past year?   0 1 1  Was there an injury with Fall?   0  0  0   Fall Risk Category Calculator   0 2 2  (RETIRED) Patient Fall Risk Level High fall risk  High fall risk      Patient at Risk for Falls Due to    History of fall(s);Impaired balance/gait;Impaired mobility History of fall(s);Impaired balance/gait;Impaired mobility  Fall risk Follow up    Falls evaluation completed;Falls prevention discussed;Education provided Falls evaluation completed     Data saved with a previous flowsheet row definition  Functional Status Survey:    Vitals:   03/22/24 1138  BP: 137/70  Pulse: 87  Resp: 17  Temp: 98.6 F (37 C)  SpO2: 95%  Weight: 118 lb 6.4 oz (53.7 kg)  Height: 5' 2 (1.575 m)   Body mass index is 21.66 kg/m. Physical Exam Vitals reviewed.  Constitutional:      General: She is not in acute distress. HENT:     Head: Normocephalic.     Right Ear: There is no impacted cerumen.     Left Ear: There is no impacted cerumen.     Nose: Nose normal.     Mouth/Throat:     Mouth: Mucous membranes are moist.  Eyes:     General:        Right eye: No discharge.        Left eye: No discharge.  Cardiovascular:     Rate and Rhythm: Normal rate and regular rhythm.     Pulses: Normal pulses.     Heart sounds: Normal heart sounds.  Pulmonary:     Effort: Pulmonary effort is normal.     Breath sounds: Normal breath sounds.  Abdominal:      General: Bowel sounds are normal. There is no distension.     Palpations: Abdomen is soft.     Tenderness: There is no abdominal tenderness.  Musculoskeletal:     Cervical back: Neck supple.     Right lower leg: No edema.     Left lower leg: No edema.  Skin:    General: Skin is warm.     Capillary Refill: Capillary refill takes less than 2 seconds.  Neurological:     General: No focal deficit present.     Mental Status: She is alert. Mental status is at baseline.     Gait: Gait abnormal.  Psychiatric:        Mood and Affect: Mood normal.     Labs reviewed: Recent Labs    07/03/23 0000 10/06/23 0000 01/05/24 0000  NA 140 139 140  K 4.0 4.5 4.3  CL 101 101 101  CO2 28* 28* 27*  BUN 10 9 13   CREATININE 0.8 0.7 0.8  CALCIUM  9.4 9.3 9.6   Recent Labs    01/05/24 0000  AST 16  ALT 11  ALKPHOS 66  ALBUMIN 3.9   Recent Labs    04/07/23 0000 07/03/23 0000 10/06/23 0000 01/05/24 0000  WBC 7.6 7.2 9.4 9.9  NEUTROABS 5,252.00 4,882.00 6,759.00  --   HGB 12.2 11.8* 11.8* 12.2  HCT 39 38 38 39  PLT 192 153 154 100*   No results found for: TSH Lab Results  Component Value Date   HGBA1C 6.6 01/05/2024   Lab Results  Component Value Date   CHOL 92 01/05/2024   HDL 33 (A) 01/05/2024   LDLCALC 29 01/05/2024   TRIG 244 (A) 01/05/2024    Significant Diagnostic Results in last 30 days:  No results found.  Assessment/Plan: 1. Type 2 diabetes mellitus with polyneuropathy (HCC) (Primary) - A1c 6.6, goal < 7.0 - sugars 200-300's - no hypoglycemia - eye exam 11/19/2023 - followed by dietary> promote low sugar and carb snacks - do not recommend medication increase if A1c < 7.0 - cont metformin  and insulin  glargine  2. Essential hypertension - controlled without medication   3. Mixed hyperlipidemia - LDL stable with rosuvastatin   4. Moderate vascular dementia without behavioral disturbance, psychotic disturbance, mood disturbance, or anxiety (HCC) - recent  BIMS  11/15 03/11/2023 - no behaviors - needs assistance with ADLs except feeding - ambulates with wheelchair - weight stable  - cont skilled nursing   5. Vitamin D deficiency - stable with vitamin D     Family/ staff Communication: plan discussed with patient and nurse  Labs/tests ordered:  A1c 04/2024        [1]  Allergies Allergen Reactions   Erythromycin Diarrhea   "
